# Patient Record
Sex: Male | Born: 1955 | Race: Black or African American | Hispanic: No | Marital: Single | Smoking: Never smoker
Health system: Southern US, Community
[De-identification: ages and names within clinical notes are randomized; demographics above are authoritative.]

## PROBLEM LIST (undated history)

## (undated) DIAGNOSIS — E119 Type 2 diabetes mellitus without complications: Secondary | ICD-10-CM

## (undated) DIAGNOSIS — E7800 Pure hypercholesterolemia, unspecified: Secondary | ICD-10-CM

## (undated) DIAGNOSIS — S68119A Complete traumatic metacarpophalangeal amputation of unspecified finger, initial encounter: Secondary | ICD-10-CM

## (undated) DIAGNOSIS — I10 Essential (primary) hypertension: Secondary | ICD-10-CM

## (undated) HISTORY — DX: Type 2 diabetes mellitus without complications: E11.9

## (undated) HISTORY — DX: Complete traumatic metacarpophalangeal amputation of unspecified finger, initial encounter: S68.119A

## (undated) HISTORY — PX: HERNIA REPAIR: SHX51

## (undated) HISTORY — DX: Essential (primary) hypertension: I10

---

## 2008-09-22 ENCOUNTER — Inpatient Hospital Stay (HOSPITAL_COMMUNITY): Admission: EM | Admit: 2008-09-22 | Discharge: 2008-09-23 | Payer: Self-pay | Admitting: Emergency Medicine

## 2008-09-25 ENCOUNTER — Encounter (INDEPENDENT_AMBULATORY_CARE_PROVIDER_SITE_OTHER): Payer: Self-pay | Admitting: Internal Medicine

## 2008-09-25 ENCOUNTER — Ambulatory Visit (HOSPITAL_COMMUNITY): Admission: RE | Admit: 2008-09-25 | Discharge: 2008-09-25 | Payer: Self-pay | Admitting: Internal Medicine

## 2008-09-25 ENCOUNTER — Ambulatory Visit: Payer: Self-pay | Admitting: Cardiology

## 2011-03-18 NOTE — H&P (Signed)
NAME:  Jack Cherry, Jack Cherry               ACCOUNT NO.:  [id number]   MEDICAL RECORD NO.:  [id number]          PATIENT TYPE:  INP   LOCATION:  A309                          FACILITY:  [facility]   PHYSICIAN:  Lonia Blood, M.D.      DATE OF BIRTH:  1956-02-23   DATE OF ADMISSION:  09/22/2008  DATE OF DISCHARGE:  11/21/2009LH                              HISTORY & PHYSICAL   PRIMARY CARE PHYSICIAN:  The patient is unassigned.   PRESENTING COMPLAINT:  Abnormal EKG.   HISTORY OF PRESENT ILLNESS:  The patient is a 55 year old African  American man who apparently was arrested by the police selling drugs,  but the workup was to screen the patient.  EMS was called, and the  patient was found to have blood pressure of over 180/120.  In addition,  he was found to have abnormal EKG.  The patient, however, has no  specific complaint.  No chest pain, no shortness of breath.  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  He was brought  to the emergency room with a high blood pressure.  The combination of  his high blood pressure and abnormal EKG with no previous EKG was  considered worrisome and after that the patient was admitted for further  workup.  The patient sells drugs, but denied using any IV drug use,  denied any drug at all.  Specifically, he denies using any cocaine and  denied any prior chest pains or any other medical issues.   PAST MEDICAL HISTORY:  None.   ALLERGY:  No known drug allergies.   MEDICATIONS:  None.   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   FAMILY HISTORY:  Unknown.   REVIEW OF SYSTEMS:  A 12-point review of systems is completely negative  except per HPI.   PHYSICAL EXAMINATION:  VITAL SIGNS:  Temperature is 98, blood pressure  179/119, and his pulse was 111.  His sats were 92% on room air.  GENERAL:  The patient is awake, alert,  oriented, in no acute distress.  HEENT:  PERRL.  EOMI.  NECK:  Supple.  No JVD.  No lymphadenopathy.  RESPIRATORY:  He has good air entry bilaterally.  No wheezes or rales.  CARDIOVASCULAR SYSTEM:  The patient has S1 and S2.  No murmur.  ABDOMEN:  Soft, nontender with positive bowel sounds.  EXTREMITIES:  No edema, cyanosis, or clubbing.   LABORATORY DATA:  White count 6.7, hemoglobin 13.5, platelet count 227  with normal differentials.  Initial myoglobin 477, but otherwise cardiac  enzymes negative.  Urinalysis negative.  Urine drug screen is completely  negative.  D-dimer 0.41.  PT 13, INR 1.0.  BNP less than 30.  Sodium is  139, potassium 3.7, chloride 110, CO2 24, glucose 114, BUN 8, creatinine  0.97, and calcium 9.2.  Salicylate level is negative.  Alcohol level  less than 5.  His chest x-ray showed no active  disease.  Head CT without  contrast also showed normal exam.   ASSESSMENT:  Therefore, he is a 55 year old gentleman with normal EKG  that shows sinus tachycardia with a rate of 108 and nonspecific ST-wave  changes mainly involving the inferolateral region with T-wave inversion,  otherwise normal intervals.  The patient may have had prior  myocardial  infarction, although he denied it and this could be a manifestation of  hypertensive heart disease.  Other possibilities could be a normal  variant patient.  The patient has no symptoms at all.  However, we will  admit him for observation and check 2D echo to make sure we are not  missing something or some cardiomyopathy.  If all results are normal, we  will discharge the patient home after a 2D echo.  1. Hypertension.  Again, this must be longstanding.  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      Lonia Blood, M.D.  Electronically Signed     LG/MEDQ  D:  09/23/2008  T:  09/23/2008  Job:  [id number]

## 2011-08-06 LAB — CBC
HCT: 35.4 — ABNORMAL LOW
HCT: 39.1
MCHC: 34.7
MCV: 91.9
MCV: 92.8
Platelets: 227
RBC: 3.82 — ABNORMAL LOW
WBC: 6.2

## 2011-08-06 LAB — URINALYSIS, ROUTINE W REFLEX MICROSCOPIC
Bilirubin Urine: NEGATIVE
Ketones, ur: NEGATIVE
Leukocytes, UA: NEGATIVE
Protein, ur: 100 — AB
Specific Gravity, Urine: 1.03
Urobilinogen, UA: 0.2

## 2011-08-06 LAB — DIFFERENTIAL
Basophils Absolute: 0
Eosinophils Absolute: 0
Eosinophils Relative: 0
Lymphocytes Relative: 13
Lymphs Abs: 0.9
Lymphs Abs: 1.3
Monocytes Relative: 8
Neutro Abs: 5.5
Neutrophils Relative %: 70

## 2011-08-06 LAB — APTT: aPTT: 23 — ABNORMAL LOW

## 2011-08-06 LAB — COMPREHENSIVE METABOLIC PANEL
AST: 26
BUN: 8
CO2: 24
Calcium: 9.2
Creatinine, Ser: 0.97
GFR calc Af Amer: >60
GFR calc non Af Amer: >60

## 2011-08-06 LAB — BASIC METABOLIC PANEL
Chloride: 108
Creatinine, Ser: 0.95
GFR calc Af Amer: >60
Potassium: 3.8
Sodium: 141

## 2011-08-06 LAB — RAPID URINE DRUG SCREEN, HOSP PERFORMED
Barbiturates: NOT DETECTED
Cocaine: NOT DETECTED
Opiates: NOT DETECTED

## 2011-08-06 LAB — B-NATRIURETIC PEPTIDE (CONVERTED LAB): Pro B Natriuretic peptide (BNP): <30

## 2011-08-06 LAB — CARDIAC PANEL(CRET KIN+CKTOT+MB+TROPI): Total CK: 1009 — ABNORMAL HIGH

## 2011-08-06 LAB — URINE MICROSCOPIC-ADD ON

## 2011-08-06 LAB — POCT CARDIAC MARKERS: Troponin i, poc: <0.05

## 2011-08-06 LAB — SALICYLATE LEVEL: Salicylate Lvl: <4

## 2011-08-06 LAB — PROTIME-INR
INR: 1
Prothrombin Time: 13

## 2011-08-06 LAB — D-DIMER, QUANTITATIVE: D-Dimer, Quant: 0.41

## 2015-08-19 ENCOUNTER — Other Ambulatory Visit: Payer: Self-pay | Admitting: Physician Assistant

## 2015-09-06 ENCOUNTER — Other Ambulatory Visit: Payer: Self-pay | Admitting: Physician Assistant

## 2015-09-24 ENCOUNTER — Ambulatory Visit: Payer: Self-pay | Admitting: Physician Assistant

## 2015-09-24 ENCOUNTER — Encounter: Payer: Self-pay | Admitting: Physician Assistant

## 2015-09-24 VITALS — BP 166/95 | HR 59 | Temp 97.5°F | Ht 71.0 in | Wt 247.5 lb

## 2015-09-24 DIAGNOSIS — I10 Essential (primary) hypertension: Secondary | ICD-10-CM | POA: Insufficient documentation

## 2015-09-24 MED ORDER — LISINOPRIL 10 MG PO TABS: 10.0000 mg | ORAL_TABLET | Freq: Every day | ORAL | Status: DC

## 2015-09-24 NOTE — Progress Notes (Signed)
   BP 166/95 mmHg  Pulse 59  Temp(Src) 97.5 F (36.4 C)  Ht 5\' 11"  (1.803 m)  Wt 247 lb 8 oz (112.265 kg)  BMI 34.53 kg/m2  SpO2 97%   Subjective:    Patient ID: Jack Cherry, male    DOB: 1956-01-12, 59 y.o.   MRN: [id number]  HPI: Jack Cherry is a 59 y.o. male presenting on 09/24/2015 for Hypertension   HPI  Chief Complaint  Patient presents with  . Hypertension    pt states he took his BP med this morning.     Relevant past medical, surgical, family and social history reviewed and updated as indicated. Interim medical history since our last visit reviewed. Allergies and medications reviewed and updated.  Current outpatient prescriptions:  .  amLODipine (NORVASC) 10 MG tablet, Take 10 mg by mouth daily., Disp: , Rfl:  .  atenolol (TENORMIN) 100 MG tablet, TAKE ONE TABLET BY MOUTH EVERY DAY FOR BLOOD PRESSURE, Disp: 30 tablet, Rfl: 1 .  loratadine (CLARITIN) 10 MG tablet, Take 1 tablet (10 mg total) by mouth daily as needed for allergies. (Patient taking differently: Take 10 mg by mouth daily. ), Disp: 30 tablet, Rfl: 1   Review of Systems  Constitutional: Negative for fever, chills, diaphoresis, appetite change, fatigue and unexpected weight change.  HENT: Positive for sneezing. Negative for congestion, dental problem, drooling, ear pain, facial swelling, hearing loss, mouth sores, sore throat, trouble swallowing and voice change.   Eyes: Negative for pain, discharge, redness, itching and visual disturbance.  Respiratory: Negative for choking and shortness of breath.   Cardiovascular: Negative for chest pain, palpitations and leg swelling.  Gastrointestinal: Negative for vomiting, abdominal pain, diarrhea, constipation and blood in stool.  Endocrine: Negative for cold intolerance, heat intolerance and polydipsia.  Genitourinary: Negative for dysuria, hematuria and decreased urine volume.  Musculoskeletal: Negative for back pain, arthralgias and gait problem.  Skin:  Negative for rash.  Allergic/Immunologic: Negative for environmental allergies.  Neurological: Negative for seizures, syncope, light-headedness and headaches.  Hematological: Negative for adenopathy.  Psychiatric/Behavioral: Negative for suicidal ideas, dysphoric mood and agitation. The patient is not nervous/anxious.     Per HPI unless specifically indicated above     Objective:    BP 166/95 mmHg  Pulse 59  Temp(Src) 97.5 F (36.4 C)  Ht 5\' 11"  (1.803 m)  Wt 247 lb 8 oz (112.265 kg)  BMI 34.53 kg/m2  SpO2 97%  Wt Readings from Last 3 Encounters:  09/24/15 247 lb 8 oz (112.265 kg)    Physical Exam  Constitutional: He is oriented to person, place, and time. He appears well-developed and well-nourished.  HENT:  Head: Normocephalic and atraumatic.  Neck: Neck supple.  Cardiovascular: Normal rate and regular rhythm.   Pulmonary/Chest: Effort normal and breath sounds normal. He has no wheezes.  Abdominal: Soft. Bowel sounds are normal. There is no tenderness.  Obese  Lymphadenopathy:    He has no cervical adenopathy.  Neurological: He is alert and oriented to person, place, and time.  Skin: Skin is warm and dry.  Psychiatric: He has a normal mood and affect. His behavior is normal.  Vitals reviewed.       Assessment & Plan:   Encounter Diagnosis  Name Primary?  . Essential hypertension, benign Yes    Add lisinopril and recheck bp 6 wk

## 2015-10-02 ENCOUNTER — Other Ambulatory Visit: Payer: Self-pay | Admitting: Physician Assistant

## 2015-10-30 ENCOUNTER — Other Ambulatory Visit: Payer: Self-pay | Admitting: Physician Assistant

## 2015-11-14 ENCOUNTER — Other Ambulatory Visit: Payer: Self-pay | Admitting: Physician Assistant

## 2015-11-19 ENCOUNTER — Ambulatory Visit: Payer: Self-pay | Admitting: Physician Assistant

## 2015-11-19 ENCOUNTER — Encounter: Payer: Self-pay | Admitting: Physician Assistant

## 2015-11-19 VITALS — BP 176/110 | HR 64 | Temp 98.1°F | Ht 71.0 in | Wt 253.0 lb

## 2015-11-19 DIAGNOSIS — R059 Cough, unspecified: Secondary | ICD-10-CM

## 2015-11-19 DIAGNOSIS — R7303 Prediabetes: Secondary | ICD-10-CM

## 2015-11-19 DIAGNOSIS — Z13220 Encounter for screening for lipoid disorders: Secondary | ICD-10-CM

## 2015-11-19 DIAGNOSIS — R05 Cough: Secondary | ICD-10-CM

## 2015-11-19 DIAGNOSIS — Z125 Encounter for screening for malignant neoplasm of prostate: Secondary | ICD-10-CM

## 2015-11-19 DIAGNOSIS — I10 Essential (primary) hypertension: Secondary | ICD-10-CM

## 2015-11-19 MED ORDER — ATENOLOL 100 MG PO TABS: 100.0000 mg | ORAL_TABLET | Freq: Every day | ORAL | Status: DC

## 2015-11-19 MED ORDER — LISINOPRIL 20 MG PO TABS: 20.0000 mg | ORAL_TABLET | Freq: Every day | ORAL | Status: DC

## 2015-11-19 NOTE — Progress Notes (Signed)
BP 176/110 mmHg  Pulse 64  Temp(Src) 98.1 F (36.7 C)  Ht 5\' 11"  (1.803 m)  Wt 253 lb (114.76 kg)  BMI 35.30 kg/m2  SpO2 96%   Subjective:    Patient ID: Jack Cherry, male    DOB: [birth year], 60 y.o.   MRN: [id number]  HPI: Jack Cherry is a 60 y.o. male presenting on 11/19/2015 for Hypertension   HPI   Pt says he is feeling good.  Not much cough.  He thinks it is better than it was at last OV.  Relevant past medical, surgical, family and social history reviewed and updated as indicated. Interim medical history since our last visit reviewed. Allergies and medications reviewed and updated.   Current outpatient prescriptions:  .  amLODipine (NORVASC) 10 MG tablet, TAKE ONE TABLET BY MOUTH EVERY DAY FOR BLOOD PRESSURE, Disp: 30 tablet, Rfl: 2 .  atenolol (TENORMIN) 100 MG tablet, TAKE ONE TABLET BY MOUTH EVERY DAY FOR BLOOD PRESSURE, Disp: 30 tablet, Rfl: 2 .  lisinopril (PRINIVIL,ZESTRIL) 10 MG tablet, Take 1 tablet (10 mg total) by mouth daily., Disp: 30 tablet, Rfl: 3 .  loratadine (CLARITIN) 10 MG tablet, Take 1 tablet (10 mg total) by mouth daily as needed., Disp: 30 tablet, Rfl: 3   Review of Systems  Constitutional: Negative for fever, chills, diaphoresis, appetite change, fatigue and unexpected weight change.  HENT: Negative for congestion, dental problem, drooling, ear pain, facial swelling, hearing loss, mouth sores, sneezing, sore throat, trouble swallowing and voice change.   Eyes: Negative for pain, discharge, redness, itching and visual disturbance.  Respiratory: Positive for cough. Negative for choking, shortness of breath and wheezing.   Cardiovascular: Negative for chest pain, palpitations and leg swelling.  Gastrointestinal: Negative for vomiting, abdominal pain, diarrhea, constipation and blood in stool.  Endocrine: Negative for cold intolerance, heat intolerance and polydipsia.  Genitourinary: Negative for dysuria, hematuria and decreased urine volume.   Musculoskeletal: Negative for back pain, arthralgias and gait problem.  Skin: Negative for rash.  Allergic/Immunologic: Positive for environmental allergies.  Neurological: Negative for seizures, syncope, light-headedness and headaches.  Hematological: Negative for adenopathy.  Psychiatric/Behavioral: Negative for suicidal ideas, dysphoric mood and agitation. The patient is not nervous/anxious.     Per HPI unless specifically indicated above     Objective:    BP 176/110 mmHg  Pulse 64  Temp(Src) 98.1 F (36.7 C)  Ht 5\' 11"  (1.803 m)  Wt 253 lb (114.76 kg)  BMI 35.30 kg/m2  SpO2 96%  Wt Readings from Last 3 Encounters:  11/19/15 253 lb (114.76 kg)  09/24/15 247 lb 8 oz (112.265 kg)    Physical Exam  Constitutional: He is oriented to person, place, and time. He appears well-developed and well-nourished.  HENT:  Head: Normocephalic and atraumatic.  Neck: Neck supple.  Cardiovascular: Normal rate and regular rhythm.   Pulmonary/Chest: Effort normal and breath sounds normal. He has no wheezes.  Abdominal: Soft. Bowel sounds are normal. There is no tenderness.  Musculoskeletal: He exhibits no edema.  Lymphadenopathy:    He has no cervical adenopathy.  Neurological: He is alert and oriented to person, place, and time.  Skin: Skin is warm and dry.  Psychiatric: He has a normal mood and affect. His behavior is normal.  Vitals reviewed.       Assessment & Plan:   Encounter Diagnoses  Name Primary?  . Essential hypertension, benign Yes  . Screening for prostate cancer   . Screening cholesterol level   .  Prediabetes   . Cough     -Increase lisinopril to 20mg - will send rx to [facility] -Sign up pt for [facility] -[facility] discount app to get cxr -F/u 1 mo to recheck bp and review labs

## 2015-11-19 NOTE — Patient Instructions (Addendum)
Fill out and turn in [place] form to [employer] blood drawn in [place] at [employer] out and turn in [employer] application (to check chest xray)  Medicines coming from [facility]: Amlodipine Lisinopril Loratadine  Medicines you need to pick up from [facility] [place] Atenolol

## 2015-12-17 ENCOUNTER — Other Ambulatory Visit: Payer: Self-pay | Admitting: Physician Assistant

## 2015-12-17 MED ORDER — AMLODIPINE BESYLATE 10 MG PO TABS: 10.0000 mg | ORAL_TABLET | Freq: Every day | ORAL | Status: DC

## 2015-12-24 ENCOUNTER — Ambulatory Visit: Payer: Self-pay | Admitting: Physician Assistant

## 2015-12-24 ENCOUNTER — Encounter: Payer: Self-pay | Admitting: Physician Assistant

## 2015-12-24 VITALS — BP 154/87 | HR 59 | Temp 97.4°F | Ht 71.0 in | Wt 258.0 lb

## 2015-12-24 DIAGNOSIS — R05 Cough: Secondary | ICD-10-CM

## 2015-12-24 DIAGNOSIS — R059 Cough, unspecified: Secondary | ICD-10-CM | POA: Insufficient documentation

## 2015-12-24 DIAGNOSIS — I10 Essential (primary) hypertension: Secondary | ICD-10-CM

## 2015-12-24 DIAGNOSIS — E669 Obesity, unspecified: Secondary | ICD-10-CM

## 2015-12-24 NOTE — Patient Instructions (Signed)
Get blood drawn (fasting)- in [place] across from [facility]- at [place]. Get chest xray- at [facility].

## 2015-12-24 NOTE — Progress Notes (Signed)
BP 154/87 mmHg  Pulse 59  Temp(Src) 97.4 F (36.3 C)  Ht 5\' 11"  (1.803 m)  Wt 258 lb (117.028 kg)  BMI 36.00 kg/m2  SpO2 96%   Subjective:    Patient ID: Jack Cherry, male    DOB: [birth year], 60 y.o.   MRN: [id number]  HPI: Jack Cherry is a 60 y.o. male presenting on 12/24/2015 for Hypertension   HPI   Pt states his cough is getting better  Pt did not get blood drawn.   He says he turned in his [facility] discount application   Relevant past medical, surgical, family and social history reviewed and updated as indicated. Interim medical history since our last visit reviewed. Allergies and medications reviewed and updated.  Current outpatient prescriptions:  .  amLODipine (NORVASC) 10 MG tablet, Take 1 tablet (10 mg total) by mouth daily. for blood pressure, Disp: 90 tablet, Rfl: 1 .  atenolol (TENORMIN) 100 MG tablet, Take 1 tablet (100 mg total) by mouth daily., Disp: 30 tablet, Rfl: 3 .  lisinopril (PRINIVIL,ZESTRIL) 20 MG tablet, Take 1 tablet (20 mg total) by mouth daily., Disp: 90 tablet, Rfl: 3 .  loratadine (CLARITIN) 10 MG tablet, Take 1 tablet (10 mg total) by mouth daily as needed., Disp: 30 tablet, Rfl: 3   Review of Systems  Constitutional: Negative for fever, chills, diaphoresis, appetite change, fatigue and unexpected weight change.  HENT: Negative for congestion, dental problem, drooling, ear pain, facial swelling, hearing loss, mouth sores, sneezing, sore throat, trouble swallowing and voice change.   Eyes: Negative for pain, discharge, redness, itching and visual disturbance.  Respiratory: Positive for cough. Negative for choking, shortness of breath and wheezing.   Cardiovascular: Negative for chest pain, palpitations and leg swelling.  Gastrointestinal: Negative for vomiting, abdominal pain, diarrhea, constipation and blood in stool.  Endocrine: Negative for cold intolerance, heat intolerance and polydipsia.  Genitourinary: Negative for dysuria, hematuria  and decreased urine volume.  Musculoskeletal: Negative for back pain, arthralgias and gait problem.  Skin: Negative for rash.  Allergic/Immunologic: Negative for environmental allergies.  Neurological: Negative for seizures, syncope, light-headedness and headaches.  Hematological: Negative for adenopathy.  Psychiatric/Behavioral: Negative for suicidal ideas, dysphoric mood and agitation. The patient is not nervous/anxious.     Per HPI unless specifically indicated above     Objective:    BP 154/87 mmHg  Pulse 59  Temp(Src) 97.4 F (36.3 C)  Ht 5\' 11"  (1.803 m)  Wt 258 lb (117.028 kg)  BMI 36.00 kg/m2  SpO2 96%  Wt Readings from Last 3 Encounters:  12/24/15 258 lb (117.028 kg)  11/19/15 253 lb (114.76 kg)  09/24/15 247 lb 8 oz (112.265 kg)    Physical Exam  Constitutional: He is oriented to person, place, and time. He appears well-developed and well-nourished.  HENT:  Head: Normocephalic and atraumatic.  Neck: Neck supple.  Cardiovascular: Normal rate and regular rhythm.   Pulmonary/Chest: Effort normal and breath sounds normal. He has no wheezes.  Abdominal: Soft. Bowel sounds are normal. There is no hepatosplenomegaly. There is no tenderness.  Musculoskeletal: He exhibits no edema.  Lymphadenopathy:    He has no cervical adenopathy.  Neurological: He is alert and oriented to person, place, and time.  Skin: Skin is warm and dry.  Psychiatric: He has a normal mood and affect. His behavior is normal.  Vitals reviewed.       Assessment & Plan:   Encounter Diagnoses  Name Primary?  . Essential hypertension, benign Yes  .  Cough   . Obesity, unspecified     -get blood drawn -get cxr -f/u 1 mo

## 2015-12-26 ENCOUNTER — Other Ambulatory Visit: Payer: Self-pay | Admitting: Physician Assistant

## 2015-12-26 ENCOUNTER — Ambulatory Visit (HOSPITAL_COMMUNITY)
Admission: RE | Admit: 2015-12-26 | Discharge: 2015-12-26 | Disposition: A | Discharge status: Home / Self Care | Payer: Self-pay | Source: Ambulatory Visit | Attending: Physician Assistant | Admitting: Physician Assistant

## 2015-12-26 DIAGNOSIS — R05 Cough: Secondary | ICD-10-CM | POA: Insufficient documentation

## 2015-12-26 DIAGNOSIS — Z87891 Personal history of nicotine dependence: Secondary | ICD-10-CM | POA: Insufficient documentation

## 2015-12-26 LAB — LIPID PANEL
CHOL/HDL RATIO: 5.8 ratio — AB (ref <=5.0)
Cholesterol: 190 mg/dL (ref 125–200)
HDL: 33 mg/dL — AB (ref >=40)
LDL CALC: 108 mg/dL (ref <130)
TRIGLYCERIDES: 244 mg/dL — AB (ref <150)
VLDL: 49 mg/dL — AB (ref <30)

## 2015-12-26 LAB — COMPLETE METABOLIC PANEL WITH GFR
ALT: 27 U/L (ref 9–46)
AST: 26 U/L (ref 10–35)
Albumin: 4.2 g/dL (ref 3.6–5.1)
Alkaline Phosphatase: 53 U/L (ref 40–115)
BILIRUBIN TOTAL: 0.4 mg/dL (ref 0.2–1.2)
BUN: 14 mg/dL (ref 7–25)
CALCIUM: 9.3 mg/dL (ref 8.6–10.3)
CHLORIDE: 106 mmol/L (ref 98–110)
CO2: 23 mmol/L (ref 20–31)
CREATININE: 0.98 mg/dL (ref 0.70–1.25)
GFR, Est Non African American: 83 mL/min (ref >=60)
Glucose, Bld: 102 mg/dL — ABNORMAL HIGH (ref 65–99)
Potassium: 4.2 mmol/L (ref 3.5–5.3)
Sodium: 140 mmol/L (ref 135–146)
TOTAL PROTEIN: 7.3 g/dL (ref 6.1–8.1)

## 2015-12-26 LAB — CBC WITH DIFFERENTIAL/PLATELET
BASOS ABS: 0 10*3/uL (ref 0.0–0.1)
Basophils Relative: 1 % (ref 0–1)
EOS ABS: 0.1 10*3/uL (ref 0.0–0.7)
EOS PCT: 3 % (ref 0–5)
HEMATOCRIT: 38.3 % — AB (ref 39.0–52.0)
Hemoglobin: 12.8 g/dL — ABNORMAL LOW (ref 13.0–17.0)
LYMPHS ABS: 1.6 10*3/uL (ref 0.7–4.0)
LYMPHS PCT: 46 % (ref 12–46)
MCH: 30.9 pg (ref 26.0–34.0)
MCHC: 33.4 g/dL (ref 30.0–36.0)
MCV: 92.5 fL (ref 78.0–100.0)
MONO ABS: 0.3 10*3/uL (ref 0.1–1.0)
MPV: 9.9 fL (ref 8.6–12.4)
Monocytes Relative: 8 % (ref 3–12)
Neutro Abs: 1.5 10*3/uL — ABNORMAL LOW (ref 1.7–7.7)
Neutrophils Relative %: 42 % — ABNORMAL LOW (ref 43–77)
PLATELETS: 228 10*3/uL (ref 150–400)
RBC: 4.14 MIL/uL — ABNORMAL LOW (ref 4.22–5.81)
RDW: 15.5 % (ref 11.5–15.5)
WBC: 3.5 10*3/uL — AB (ref 4.0–10.5)

## 2015-12-26 LAB — HEMOGLOBIN A1C
Hgb A1c MFr Bld: 6.5 % — ABNORMAL HIGH (ref <5.7)
Mean Plasma Glucose: 140 mg/dL — ABNORMAL HIGH (ref <117)

## 2015-12-27 LAB — PSA: PSA: 2.41 ng/mL (ref <=4.00)

## 2016-01-21 ENCOUNTER — Encounter: Payer: Self-pay | Admitting: Physician Assistant

## 2016-01-21 ENCOUNTER — Ambulatory Visit: Payer: Self-pay | Admitting: Physician Assistant

## 2016-01-21 ENCOUNTER — Encounter: Payer: Self-pay | Admitting: Student

## 2016-01-21 VITALS — BP 130/88 | HR 63 | Temp 99.0°F | Ht 71.0 in | Wt 255.0 lb

## 2016-01-21 DIAGNOSIS — I10 Essential (primary) hypertension: Secondary | ICD-10-CM

## 2016-01-21 DIAGNOSIS — Z6835 Body mass index (BMI) 35.0-35.9, adult: Secondary | ICD-10-CM | POA: Insufficient documentation

## 2016-01-21 DIAGNOSIS — E66812 Obesity, class 2: Secondary | ICD-10-CM | POA: Insufficient documentation

## 2016-01-21 DIAGNOSIS — R059 Cough, unspecified: Secondary | ICD-10-CM

## 2016-01-21 DIAGNOSIS — R05 Cough: Secondary | ICD-10-CM

## 2016-01-21 DIAGNOSIS — E785 Hyperlipidemia, unspecified: Secondary | ICD-10-CM

## 2016-01-21 DIAGNOSIS — E782 Mixed hyperlipidemia: Secondary | ICD-10-CM | POA: Insufficient documentation

## 2016-01-21 DIAGNOSIS — E119 Type 2 diabetes mellitus without complications: Secondary | ICD-10-CM

## 2016-01-21 DIAGNOSIS — E1165 Type 2 diabetes mellitus with hyperglycemia: Secondary | ICD-10-CM | POA: Insufficient documentation

## 2016-01-21 DIAGNOSIS — E669 Obesity, unspecified: Secondary | ICD-10-CM

## 2016-01-21 MED ORDER — ATORVASTATIN CALCIUM 10 MG PO TABS: 10.0000 mg | ORAL_TABLET | Freq: Every day | ORAL | Status: DC

## 2016-01-21 MED ORDER — LOSARTAN POTASSIUM 100 MG PO TABS: 100.0000 mg | ORAL_TABLET | Freq: Every day | ORAL | Status: DC

## 2016-01-21 MED ORDER — METFORMIN HCL ER 500 MG PO TB24: 500.0000 mg | ORAL_TABLET | Freq: Every day | ORAL | Status: DC

## 2016-01-21 NOTE — Telephone Encounter (Signed)
Err. Encounter

## 2016-01-21 NOTE — Patient Instructions (Signed)
Type 2 Diabetes Mellitus, Adult  Type 2 diabetes mellitus is a long-term (chronic) disease. In type 2 diabetes:  · The pancreas does not make enough of a hormone called insulin.  · The cells in the body do not respond as well to the insulin that is made.  · Both of the above can happen.  Normally, insulin moves sugars from food into tissue cells. This gives you energy. If you have type 2 diabetes, sugars cannot be moved into tissue cells. This causes high blood sugar (hyperglycemia).   Your doctors will set personal treatment goals for you based on your age, your medicines, how long you have had diabetes, and any other medical conditions you have. Generally, the goal of treatment is to maintain the following blood glucose levels:  · Before meals (preprandial): 80-130 mg/dL.  · After meals (postprandial): below 180 mg/dL.  · A1c: less than 6.5-7%.  HOME CARE  · Have your hemoglobin A1c level checked twice a year. The level shows if your diabetes is under control or out of control.  · Test your blood sugar level every day as told by your doctor.  · Check your ketone levels by testing your pee (urine) when you are sick and as told.  · Take your diabetes or insulin medicine as told by your doctor.    Never run out of insulin.    Adjust how much insulin you give yourself based on how many carbs (carbohydrates) you eat. Carbs are in many foods, such as fruits, vegetables, whole grains, and dairy products.  · Have a healthy snack between every healthy meal. Have 3 meals and 3 snacks a day.  · Lose weight if you are overweight.  · Carry a medical alert card or wear your medical alert jewelry.  · Carry a 15-gram carb snack with you at all times. Examples include:    Glucose pills, 3 or 4.    Glucose gel, 15-gram tube.    Raisins, 2 tablespoons (24 grams).    Jelly beans, 6.    Animal crackers, 8.    Regular (not diet) pop, 4 ounces (120 milliliters).    Gummy treats, 9.  · Notice low blood sugar (hypoglycemia) symptoms, such  as:    Shaking (tremors).    Trouble thinking clearly.    Sweating.    Faster heart rate.    Headache.    Dry mouth.    Hunger.    Crabbiness (irritability).    Being worried or tense (anxious).    Restless sleep.    A change in speech or coordination.    Confusion.  · Treat low blood sugar right away. If you are alert and can swallow, follow the 15:15 rule:    Take 15-20 grams of a rapid-acting glucose or carb. This includes glucose gel, glucose pills, or 4 ounces (120 milliliters) of fruit juice, regular pop, or low-fat milk.    Check your blood sugar level 15 minutes after taking the glucose.    Take 15-20 grams more of glucose if the repeat blood sugar level is still 70 mg/dL (milligrams/deciliter) or below.    Eat a meal or snack within 1 hour of the blood sugar levels going back to normal.  · Notice early symptoms of high blood sugar, such as:    Being really thirsty or drinking a lot (polydipsia).    Peeing a lot (polyuria).  · Do at least 150 minutes of physical activity a week or as told.      Split the 150 minutes of activity up during the week. Do not do 150 minutes of activity in one day.    Perform exercises, such as weight lifting, at least 2 times a week or as told.    Spend no more than 90 minutes at one time inactive.  · Adjust your insulin or food intake as needed if you start a new exercise or sport.  · Follow your sick-day plan when you are not able to eat or drink as usual.  · Do not smoke, chew tobacco, or use electronic cigarettes.  · Women who are not pregnant should drink no more than 1 drink a day. Men should drink no more than 2 drinks a day.    Only drink alcohol with food.    Ask your doctor if alcohol is safe for you.    Tell your doctor if you drink alcohol several times during the week.  · See your doctor regularly.  · Schedule an eye exam soon after you are told you have diabetes. Schedule exams once every year.  · Check your skin and feet every day. Check for cuts, bruises, redness,  nail problems, bleeding, blisters, or sores. A doctor should do a foot exam once a year.  · Brush your teeth and gums twice a day. Floss once a day. Visit your dentist regularly.  · Share your diabetes plan with your workplace or school.  · Keep your shots that fight diseases (vaccines) up to date.    Get a flu (influenza) shot every year.    Get a pneumonia shot. If you are 65 years of age or older and you have never gotten a pneumonia shot, you might need to get two shots.    Ask your doctor which other shots you should get.  · Learn how to deal with stress.  · Get diabetes education and support as needed.  · Ask your doctor for special help if:    You need help to maintain or improve how you do things on your own.    You need help to maintain or improve the quality of your life.    You have foot or hand problems.    You have trouble cleaning yourself, dressing, eating, or doing physical activity.  GET HELP IF:  · You are unable to eat or drink for more than 6 hours.  · You feel sick to your stomach (nauseous) or throw up (vomit) for more than 6 hours.  · Your blood sugar level is over 240 mg/dL.  · There is a change in mental status.  · You get another serious illness.  · You have watery poop (diarrhea) for more than 6 hours.  · You have been sick or have had a fever for 2 or more days and are not getting better.  · You have pain when you are active.  GET HELP RIGHT AWAY IF:  · You have trouble breathing.  · Your ketone levels are higher than your doctor says they should be.  MAKE SURE YOU:  · Understand these instructions.  · Will watch your condition.  · Will get help right away if you are not doing well or get worse.     This information is not intended to replace advice given to you by your health care provider. Make sure you discuss any questions you have with your health care provider.     Document Released: 07/29/2008 Document Revised: 03/06/2015 Document Reviewed: 05/21/2012  Elsevier Interactive Patient

## 2016-01-21 NOTE — Progress Notes (Signed)
BP 130/88 mmHg  Pulse 63  Temp(Src) 99 F (37.2 C)  Ht _0  (1.803 m)  Wt 255 lb (115.667 kg)  BMI 35.58 kg/m2  SpO2 96%   Subjective:    Patient ID: Jack Cherry, male    DOB: 27-May-1956, 60 y.o.   MRN: [id number]  HPI: Jack Cherry is a 60 y.o. male presenting on 01/21/2016 for Hypertension and Cough   HPI   Pt states he still has some cough but not as much as he used to.  Relevant past medical, surgical, family and social history reviewed and updated as indicated. Interim medical history since our last visit reviewed. Allergies and medications reviewed and updated.  Current outpatient prescriptions:  .  amLODipine (NORVASC) 10 MG tablet, Take 1 tablet (10 mg total) by mouth daily. for blood pressure, Disp: 90 tablet, Rfl: 1 .  atenolol (TENORMIN) 100 MG tablet, Take 1 tablet (100 mg total) by mouth daily., Disp: 30 tablet, Rfl: 3 .  lisinopril (PRINIVIL,ZESTRIL) 20 MG tablet, Take 1 tablet (20 mg total) by mouth daily., Disp: 90 tablet, Rfl: 3 .  loratadine (CLARITIN) 10 MG tablet, Take 1 tablet (10 mg total) by mouth daily as needed., Disp: 30 tablet, Rfl: 3   Review of Systems  Constitutional: Negative for fever, chills, diaphoresis, appetite change, fatigue and unexpected weight change.  HENT: Negative for congestion, dental problem, drooling, ear pain, facial swelling, hearing loss, mouth sores, sneezing, sore throat, trouble swallowing and voice change.   Eyes: Negative for pain, discharge, redness, itching and visual disturbance.  Respiratory: Positive for cough. Negative for choking, shortness of breath and wheezing.   Cardiovascular: Negative for chest pain, palpitations and leg swelling.  Gastrointestinal: Negative for vomiting, abdominal pain, diarrhea, constipation and blood in stool.  Endocrine: Negative for cold intolerance, heat intolerance and polydipsia.  Genitourinary: Negative for dysuria, hematuria and decreased urine volume.  Musculoskeletal:  Negative for back pain, arthralgias and gait problem.  Skin: Negative for rash.  Allergic/Immunologic: Negative for environmental allergies.  Neurological: Negative for seizures, syncope, light-headedness and headaches.  Hematological: Negative for adenopathy.  Psychiatric/Behavioral: Negative for suicidal ideas, dysphoric mood and agitation. The patient is not nervous/anxious.     Per HPI unless specifically indicated above     Objective:    BP 130/88 mmHg  Pulse 63  Temp(Src) 99 F (37.2 C)  Ht _1  (1.803 m)  Wt 255 lb (115.667 kg)  BMI 35.58 kg/m2  SpO2 96%  Wt Readings from Last 3 Encounters:  01/21/16 255 lb (115.667 kg)  12/24/15 258 lb (117.028 kg)  11/19/15 253 lb (114.76 kg)    Physical Exam  Constitutional: He is oriented to person, place, and time. He appears well-developed and well-nourished.  HENT:  Head: Normocephalic and atraumatic.  Neck: Neck supple.  Cardiovascular: Normal rate and regular rhythm.   Pulmonary/Chest: Effort normal and breath sounds normal. He has no wheezes.  Abdominal: Soft. Bowel sounds are normal. There is no hepatosplenomegaly. There is no tenderness.  Musculoskeletal: He exhibits no edema.  Lymphadenopathy:    He has no cervical adenopathy.  Neurological: He is alert and oriented to person, place, and time.  Skin: Skin is warm and dry.  Psychiatric: He has a normal mood and affect. His behavior is normal.  Vitals reviewed.   Results for orders placed or performed in visit on 12/26/15  COMPLETE METABOLIC PANEL WITH GFR  Result Value Ref Range   Sodium 140 135 - 146 mmol/L   Potassium  4.2 3.5 - 5.3 mmol/L   Chloride 106 98 - 110 mmol/L   CO2 23 20 - 31 mmol/L   Glucose, Bld 102 (H) 65 - 99 mg/dL   BUN 14 7 - 25 mg/dL   Creat 0.98 0.70 - 1.25 mg/dL   Total Bilirubin 0.4 0.2 - 1.2 mg/dL   Alkaline Phosphatase 53 40 - 115 U/L   AST 26 10 - 35 U/L   ALT 27 9 - 46 U/L   Total Protein 7.3 6.1 - 8.1 g/dL   Albumin 4.2 3.6 -  5.1 g/dL   Calcium 9.3 8.6 - 10.3 mg/dL   GFR, Est African American >89 >=60 mL/min   GFR, Est Non African American 83 >=60 mL/min  CBC with Differential/Platelet  Result Value Ref Range   WBC 3.5 (L) 4.0 - 10.5 K/uL   RBC 4.14 (L) 4.22 - 5.81 MIL/uL   Hemoglobin 12.8 (L) 13.0 - 17.0 g/dL   HCT 38.3 (L) 39.0 - 52.0 %   MCV 92.5 78.0 - 100.0 fL   MCH 30.9 26.0 - 34.0 pg   MCHC 33.4 30.0 - 36.0 g/dL   RDW 15.5 11.5 - 15.5 %   Platelets 228 150 - 400 K/uL   MPV 9.9 8.6 - 12.4 fL   Neutrophils Relative % 42 (L) 43 - 77 %   Neutro Abs 1.5 (L) 1.7 - 7.7 K/uL   Lymphocytes Relative 46 12 - 46 %   Lymphs Abs 1.6 0.7 - 4.0 K/uL   Monocytes Relative 8 3 - 12 %   Monocytes Absolute 0.3 0.1 - 1.0 K/uL   Eosinophils Relative 3 0 - 5 %   Eosinophils Absolute 0.1 0.0 - 0.7 K/uL   Basophils Relative 1 0 - 1 %   Basophils Absolute 0.0 0.0 - 0.1 K/uL   Smear Review Criteria for review not met   Lipid panel  Result Value Ref Range   Cholesterol 190 125 - 200 mg/dL   Triglycerides 244 (H) <150 mg/dL   HDL 33 (L) >=40 mg/dL   Total CHOL/HDL Ratio 5.8 (H) <=5.0 Ratio   VLDL 49 (H) <30 mg/dL   LDL Cholesterol 108 <130 mg/dL  Hemoglobin A1c  Result Value Ref Range   Hgb A1c MFr Bld 6.5 (H) <5.7 %   Mean Plasma Glucose 140 (H) <117 mg/dL  PSA  Result Value Ref Range   PSA 2.41 <=4.00 ng/mL      Assessment & Plan:   Encounter Diagnoses  Name Primary?  . Type 2 diabetes mellitus without complication, unspecified long term insulin use status ([place]) Yes  . Essential hypertension, benign   . Hyperlipidemia   . Obesity, unspecified   . Cough     -reviewed labs with pt. Reviewed cxr with pt -Pt is a non-reader.  He needs low-fat diet.  Needs diabetic -teaching. He is given handout to go to Diabetic class at [facility].  Pt is counseled on diabetic eating -Stop lisinopril due to cough.  rx losartan -rx atorvastatin and metformin -F/u 1 month to check tolerance to new meds

## 2016-02-18 ENCOUNTER — Encounter: Payer: Self-pay | Admitting: Physician Assistant

## 2016-02-18 ENCOUNTER — Ambulatory Visit: Payer: Self-pay | Admitting: Physician Assistant

## 2016-02-18 VITALS — BP 118/84 | HR 94 | Temp 97.9°F | Ht 71.0 in | Wt 250.0 lb

## 2016-02-18 DIAGNOSIS — E785 Hyperlipidemia, unspecified: Secondary | ICD-10-CM

## 2016-02-18 DIAGNOSIS — E119 Type 2 diabetes mellitus without complications: Secondary | ICD-10-CM

## 2016-02-18 DIAGNOSIS — I10 Essential (primary) hypertension: Secondary | ICD-10-CM

## 2016-02-18 NOTE — Progress Notes (Signed)
   BP 118/84 mmHg  Pulse 94  Temp(Src) 97.9 F (36.6 C)  Ht 5\' 11"  (1.803 m)  Wt 250 lb (113.399 kg)  BMI 34.88 kg/m2  SpO2 97%   Subjective:    Patient ID: Jack Cherry, male    DOB: 13-Mar-1956, 60 y.o.   MRN: [id number]  HPI: Jack Cherry is a 60 y.o. male presenting on 02/18/2016 for Diabetes and Hypertension   HPI   Chief Complaint  Patient presents with  . Diabetes    pt has not received meds from [facility]  . Hypertension    Pt says he has not gotten his meds from [facility].  Pt attended DM education class and says he learned a lot.  Relevant past medical, surgical, family and social history reviewed and updated as indicated. Interim medical history since our last visit reviewed. Allergies and medications reviewed and updated.  CURRENT MEDS: Amlodipine 10mg  po qd Atenolol 100mg  qd Lisinopril 20mg  qd claritan 10mg  qd   Review of Systems  Per HPI unless specifically indicated above     Objective:    BP 118/84 mmHg  Pulse 94  Temp(Src) 97.9 F (36.6 C)  Ht 5\' 11"  (1.803 m)  Wt 250 lb (113.399 kg)  BMI 34.88 kg/m2  SpO2 97%  Wt Readings from Last 3 Encounters:  02/18/16 250 lb (113.399 kg)  01/21/16 255 lb (115.667 kg)  12/24/15 258 lb (117.028 kg)    Physical Exam  Constitutional: He is oriented to person, place, and time. He appears well-developed and well-nourished.  Pulmonary/Chest: Effort normal.  Neurological: He is alert and oriented to person, place, and time.  Skin: Skin is warm and dry.  Psychiatric: He has a normal mood and affect. His behavior is normal.  Vitals reviewed.       Assessment & Plan:    Encounter Diagnoses  Name Primary?  . Essential hypertension, benign Yes  . Type 2 diabetes mellitus without complication, unspecified long term insulin use status ([place])   . Hyperlipidemia    -We will contact [facility] to check on meds -F/u 1 month to check tolerance to new meds

## 2016-02-19 ENCOUNTER — Other Ambulatory Visit: Payer: Self-pay | Admitting: Physician Assistant

## 2016-02-19 MED ORDER — LOSARTAN POTASSIUM 100 MG PO TABS: 100.0000 mg | ORAL_TABLET | Freq: Every day | ORAL | Status: DC

## 2016-02-19 MED ORDER — METFORMIN HCL ER 500 MG PO TB24: 500.0000 mg | ORAL_TABLET | Freq: Every day | ORAL | Status: DC

## 2016-02-19 MED ORDER — AMLODIPINE BESYLATE 10 MG PO TABS: 10.0000 mg | ORAL_TABLET | Freq: Every day | ORAL | Status: DC

## 2016-02-19 MED ORDER — ATORVASTATIN CALCIUM 10 MG PO TABS: 10.0000 mg | ORAL_TABLET | Freq: Every day | ORAL | Status: DC

## 2016-03-17 ENCOUNTER — Ambulatory Visit: Payer: Self-pay | Admitting: Physician Assistant

## 2016-03-17 ENCOUNTER — Encounter: Payer: Self-pay | Admitting: Physician Assistant

## 2016-03-17 VITALS — BP 136/78 | HR 69 | Temp 97.7°F | Ht 71.0 in | Wt 248.0 lb

## 2016-03-17 DIAGNOSIS — Z1211 Encounter for screening for malignant neoplasm of colon: Secondary | ICD-10-CM

## 2016-03-17 DIAGNOSIS — E119 Type 2 diabetes mellitus without complications: Secondary | ICD-10-CM

## 2016-03-17 DIAGNOSIS — I10 Essential (primary) hypertension: Secondary | ICD-10-CM

## 2016-03-17 DIAGNOSIS — E785 Hyperlipidemia, unspecified: Secondary | ICD-10-CM

## 2016-03-17 NOTE — Progress Notes (Signed)
BP 156/98 mmHg  Pulse 69  Temp(Src) 97.7 F (36.5 C)  Ht 5\' 11"  (1.803 m)  Wt 248 lb (112.492 kg)  BMI 34.60 kg/m2  SpO2 95%   Subjective:    Patient ID: Jack Cherry, male    DOB: Feb 28, 1956, 60 y.o.   MRN: [id number]  HPI: Jack Cherry is a 60 y.o. male presenting on 03/17/2016 for Hypertension and Diabetes   HPI   Pt is doing okay with his new medicaitons.  bp high today.  Was 118/84 at [facility] on 02/18/16  Relevant past medical, surgical, family and social history reviewed and updated as indicated. Interim medical history since our last visit reviewed. Allergies and medications reviewed and updated.   Current outpatient prescriptions:  .  amLODipine (NORVASC) 10 MG tablet, Take 1 tablet (10 mg total) by mouth daily. for blood pressure, Disp: 90 tablet, Rfl: 1 .  atenolol (TENORMIN) 100 MG tablet, Take 1 tablet (100 mg total) by mouth daily., Disp: 30 tablet, Rfl: 3 .  atorvastatin (LIPITOR) 10 MG tablet, Take 1 tablet (10 mg total) by mouth daily., Disp: 90 tablet, Rfl: 1 .  loratadine (CLARITIN) 10 MG tablet, Take 1 tablet (10 mg total) by mouth daily as needed., Disp: 30 tablet, Rfl: 3 .  losartan (COZAAR) 100 MG tablet, Take 1 tablet (100 mg total) by mouth daily., Disp: 90 tablet, Rfl: 1 .  metFORMIN (GLUCOPHAGE XR) 500 MG 24 hr tablet, Take 1 tablet (500 mg total) by mouth daily with breakfast., Disp: 90 tablet, Rfl: 1   Review of Systems  Constitutional: Negative for fever, chills, diaphoresis, appetite change, fatigue and unexpected weight change.  HENT: Negative for congestion, dental problem, drooling, ear pain, facial swelling, hearing loss, mouth sores, sneezing, sore throat, trouble swallowing and voice change.   Eyes: Negative for pain, discharge, redness, itching and visual disturbance.  Respiratory: Negative for cough, choking, shortness of breath and wheezing.   Cardiovascular: Negative for chest pain, palpitations and leg swelling.  Gastrointestinal:  Negative for vomiting, abdominal pain, diarrhea, constipation and blood in stool.  Endocrine: Negative for cold intolerance, heat intolerance and polydipsia.  Genitourinary: Negative for dysuria, hematuria and decreased urine volume.  Musculoskeletal: Negative for back pain, arthralgias and gait problem.  Skin: Negative for rash.  Allergic/Immunologic: Negative for environmental allergies.  Neurological: Negative for seizures, syncope, light-headedness and headaches.  Hematological: Negative for adenopathy.  Psychiatric/Behavioral: Negative for suicidal ideas, dysphoric mood and agitation. The patient is not nervous/anxious.     Per HPI unless specifically indicated above     Objective:    BP 156/98 mmHg  Pulse 69  Temp(Src) 97.7 F (36.5 C)  Ht 5\' 11"  (1.803 m)  Wt 248 lb (112.492 kg)  BMI 34.60 kg/m2  SpO2 95%  Wt Readings from Last 3 Encounters:  03/17/16 248 lb (112.492 kg)  02/18/16 250 lb (113.399 kg)  01/21/16 255 lb (115.667 kg)    Physical Exam  Constitutional: He is oriented to person, place, and time. He appears well-developed and well-nourished.  HENT:  Head: Normocephalic and atraumatic.  Neck: Neck supple.  Cardiovascular: Normal rate and regular rhythm.   Pulmonary/Chest: Effort normal and breath sounds normal. He has no wheezes.  Abdominal: Soft. Bowel sounds are normal. There is no hepatosplenomegaly. There is no tenderness.  Musculoskeletal: He exhibits no edema.  Lymphadenopathy:    He has no cervical adenopathy.  Neurological: He is alert and oriented to person, place, and time.  Skin: Skin is warm and dry.  Psychiatric: He has a normal mood and affect. His behavior is normal.  Vitals reviewed.  Foot exam done     Assessment & Plan:   Encounter Diagnoses  Name Primary?  . Type 2 diabetes mellitus without complication, unspecified long term insulin use status ([place]) Yes  . Essential hypertension, benign   . Special screening for malignant  neoplasms, colon   . Hyperlipidemia     -Pt on list for dm eye exam -iFOBT given for colon cancer screening -continue current meds -F/u 2 mo with labs before appt

## 2016-03-24 LAB — IFOBT (OCCULT BLOOD): IFOBT: NEGATIVE

## 2016-04-26 ENCOUNTER — Other Ambulatory Visit: Payer: Self-pay | Admitting: Physician Assistant

## 2016-05-12 ENCOUNTER — Other Ambulatory Visit: Payer: Self-pay

## 2016-05-12 DIAGNOSIS — E785 Hyperlipidemia, unspecified: Secondary | ICD-10-CM

## 2016-05-12 DIAGNOSIS — I10 Essential (primary) hypertension: Secondary | ICD-10-CM

## 2016-05-12 DIAGNOSIS — E119 Type 2 diabetes mellitus without complications: Secondary | ICD-10-CM

## 2016-05-14 LAB — COMPLETE METABOLIC PANEL WITH GFR
ALT: 15 U/L (ref 9–46)
AST: 18 U/L (ref 10–35)
Albumin: 4.2 g/dL (ref 3.6–5.1)
Alkaline Phosphatase: 47 U/L (ref 40–115)
BILIRUBIN TOTAL: 0.4 mg/dL (ref 0.2–1.2)
BUN: 18 mg/dL (ref 7–25)
CO2: 21 mmol/L (ref 20–31)
CREATININE: 1.15 mg/dL (ref 0.70–1.25)
Calcium: 8.7 mg/dL (ref 8.6–10.3)
Chloride: 110 mmol/L (ref 98–110)
GFR, Est African American: 80 mL/min (ref >=60)
GFR, Est Non African American: 69 mL/min (ref >=60)
GLUCOSE: 99 mg/dL (ref 65–99)
Potassium: 4.1 mmol/L (ref 3.5–5.3)
SODIUM: 140 mmol/L (ref 135–146)
TOTAL PROTEIN: 6.7 g/dL (ref 6.1–8.1)

## 2016-05-14 LAB — LIPID PANEL
CHOL/HDL RATIO: 3 ratio (ref <=5.0)
CHOLESTEROL: 117 mg/dL — AB (ref 125–200)
HDL: 39 mg/dL — ABNORMAL LOW (ref >=40)
LDL Cholesterol: 62 mg/dL (ref <130)
Triglycerides: 82 mg/dL (ref <150)
VLDL: 16 mg/dL (ref <30)

## 2016-05-14 LAB — HEMOGLOBIN A1C
Hgb A1c MFr Bld: 6.3 % — ABNORMAL HIGH (ref <5.7)
MEAN PLASMA GLUCOSE: 134 mg/dL

## 2016-05-19 ENCOUNTER — Encounter: Payer: Self-pay | Admitting: Physician Assistant

## 2016-05-19 ENCOUNTER — Ambulatory Visit: Payer: Self-pay | Admitting: Physician Assistant

## 2016-05-19 VITALS — BP 136/90 | HR 93 | Temp 97.3°F | Ht 71.0 in | Wt 250.0 lb

## 2016-05-19 DIAGNOSIS — I10 Essential (primary) hypertension: Secondary | ICD-10-CM

## 2016-05-19 DIAGNOSIS — E119 Type 2 diabetes mellitus without complications: Secondary | ICD-10-CM

## 2016-05-19 DIAGNOSIS — E785 Hyperlipidemia, unspecified: Secondary | ICD-10-CM

## 2016-05-19 DIAGNOSIS — E669 Obesity, unspecified: Secondary | ICD-10-CM

## 2016-05-19 DIAGNOSIS — R011 Cardiac murmur, unspecified: Secondary | ICD-10-CM

## 2016-05-19 DIAGNOSIS — I491 Atrial premature depolarization: Secondary | ICD-10-CM

## 2016-05-19 NOTE — Progress Notes (Signed)
BP 136/90 mmHg  Pulse 93  Temp(Src) 97.3 F (36.3 C)  Ht 5\' 11"  (1.803 m)  Wt 250 lb (113.399 kg)  BMI 34.88 kg/m2  SpO2 97%   Subjective:    Patient ID: Jack Cherry, male    DOB: 1956-07-25, 60 y.o.   MRN: [id number]  HPI: Jack Cherry is a 60 y.o. male presenting on 05/19/2016 for Diabetes and Hypertension   HPI Pt states he is doing well today.    Relevant past medical, surgical, family and social history reviewed and updated as indicated. Interim medical history since our last visit reviewed. Allergies and medications reviewed and updated.  Current outpatient prescriptions:  .  amLODipine (NORVASC) 10 MG tablet, Take 1 tablet (10 mg total) by mouth daily. for blood pressure, Disp: 90 tablet, Rfl: 1 .  atenolol (TENORMIN) 100 MG tablet, TAKE ONE TABLET BY MOUTH ONCE DAILY, Disp: 30 tablet, Rfl: 4 .  atorvastatin (LIPITOR) 10 MG tablet, Take 1 tablet (10 mg total) by mouth daily., Disp: 90 tablet, Rfl: 1 .  loratadine (CLARITIN) 10 MG tablet, Take 1 tablet (10 mg total) by mouth daily as needed., Disp: 30 tablet, Rfl: 3 .  losartan (COZAAR) 100 MG tablet, Take 1 tablet (100 mg total) by mouth daily., Disp: 90 tablet, Rfl: 1 .  metFORMIN (GLUCOPHAGE XR) 500 MG 24 hr tablet, Take 1 tablet (500 mg total) by mouth daily with breakfast., Disp: 90 tablet, Rfl: 1  Review of Systems  Respiratory: Negative for shortness of breath.   Cardiovascular: Negative for chest pain, palpitations and leg swelling.  Gastrointestinal: Negative for abdominal pain.  Neurological: Negative for syncope.    Per HPI unless specifically indicated above     Objective:    BP 136/90 mmHg  Pulse 93  Temp(Src) 97.3 F (36.3 C)  Ht 5\' 11"  (1.803 m)  Wt 250 lb (113.399 kg)  BMI 34.88 kg/m2  SpO2 97%  Wt Readings from Last 3 Encounters:  05/19/16 250 lb (113.399 kg)  03/17/16 248 lb (112.492 kg)  02/18/16 250 lb (113.399 kg)    Physical Exam  Constitutional: He is oriented to person,  place, and time. He appears well-developed and well-nourished.  HENT:  Head: Normocephalic and atraumatic.  Neck: Neck supple.  Cardiovascular: Normal rate and regular rhythm.   Murmur heard. Frequent premature beats  Pulmonary/Chest: Effort normal and breath sounds normal. He has no wheezes.  Abdominal: Soft. Bowel sounds are normal. There is no hepatosplenomegaly. There is no tenderness.  Musculoskeletal: He exhibits no edema.  Lymphadenopathy:    He has no cervical adenopathy.  Neurological: He is alert and oriented to person, place, and time.  Skin: Skin is warm and dry.  Psychiatric: He has a normal mood and affect. His behavior is normal.  Vitals reviewed.   EKG- NSR with PACs. No previous for comp  Results for orders placed or performed in visit on 05/12/16  COMPLETE METABOLIC PANEL WITH GFR  Result Value Ref Range   Sodium 140 135 - 146 mmol/L   Potassium 4.1 3.5 - 5.3 mmol/L   Chloride 110 98 - 110 mmol/L   CO2 21 20 - 31 mmol/L   Glucose, Bld 99 65 - 99 mg/dL   BUN 18 7 - 25 mg/dL   Creat 1.15 0.70 - 1.25 mg/dL   Total Bilirubin 0.4 0.2 - 1.2 mg/dL   Alkaline Phosphatase 47 40 - 115 U/L   AST 18 10 - 35 U/L   ALT 15 9 -  46 U/L   Total Protein 6.7 6.1 - 8.1 g/dL   Albumin 4.2 3.6 - 5.1 g/dL   Calcium 8.7 8.6 - 10.3 mg/dL   GFR, Est African American 80 >=60 mL/min   GFR, Est Non African American 69 >=60 mL/min  Lipid Profile  Result Value Ref Range   Cholesterol 117 (L) 125 - 200 mg/dL   Triglycerides 82 <150 mg/dL   HDL 39 (L) >=40 mg/dL   Total CHOL/HDL Ratio 3.0 <=5.0 Ratio   VLDL 16 <30 mg/dL   LDL Cholesterol 62 <130 mg/dL  HgB A1c  Result Value Ref Range   Hgb A1c MFr Bld 6.3 (H) <5.7 %   Mean Plasma Glucose 134 mg/dL      Assessment & Plan:   Encounter Diagnoses  Name Primary?  . Type 2 diabetes mellitus without complication, unspecified long term insulin use status ([place]) Yes  . Essential hypertension, benign   . Hyperlipidemia   . Obesity,  unspecified   . Heart murmur   . Premature contractions, atrial     -Reviewed labs with pt -Continue current meds -will order echo to evaluate murmur -f/u 3 months. RTO sooner prn

## 2016-05-29 ENCOUNTER — Other Ambulatory Visit: Payer: Self-pay | Admitting: Physician Assistant

## 2016-05-29 MED ORDER — METFORMIN HCL ER 500 MG PO TB24: 500.0000 mg | ORAL_TABLET | Freq: Every day | ORAL | 0 refills | Status: DC

## 2016-05-29 MED ORDER — LOSARTAN POTASSIUM 100 MG PO TABS: 100.0000 mg | ORAL_TABLET | Freq: Every day | ORAL | 0 refills | Status: DC

## 2016-08-12 ENCOUNTER — Other Ambulatory Visit: Payer: Self-pay

## 2016-08-12 DIAGNOSIS — E119 Type 2 diabetes mellitus without complications: Secondary | ICD-10-CM

## 2016-08-12 DIAGNOSIS — E785 Hyperlipidemia, unspecified: Secondary | ICD-10-CM

## 2016-08-12 DIAGNOSIS — I10 Essential (primary) hypertension: Secondary | ICD-10-CM

## 2016-08-13 LAB — COMPLETE METABOLIC PANEL WITH GFR
ALT: 16 U/L (ref 9–46)
AST: 19 U/L (ref 10–35)
Albumin: 4.2 g/dL (ref 3.6–5.1)
Alkaline Phosphatase: 48 U/L (ref 40–115)
BUN: 19 mg/dL (ref 7–25)
CALCIUM: 9.3 mg/dL (ref 8.6–10.3)
CHLORIDE: 105 mmol/L (ref 98–110)
CO2: 22 mmol/L (ref 20–31)
CREATININE: 1.02 mg/dL (ref 0.70–1.25)
GFR, EST NON AFRICAN AMERICAN: 80 mL/min (ref >=60)
Glucose, Bld: 102 mg/dL — ABNORMAL HIGH (ref 65–99)
POTASSIUM: 3.9 mmol/L (ref 3.5–5.3)
Sodium: 140 mmol/L (ref 135–146)
Total Bilirubin: 0.4 mg/dL (ref 0.2–1.2)
Total Protein: 7 g/dL (ref 6.1–8.1)

## 2016-08-13 LAB — LIPID PANEL
CHOLESTEROL: 130 mg/dL (ref 125–200)
HDL: 38 mg/dL — AB (ref >=40)
LDL CALC: 74 mg/dL (ref <130)
TRIGLYCERIDES: 92 mg/dL (ref <150)
Total CHOL/HDL Ratio: 3.4 Ratio (ref <=5.0)
VLDL: 18 mg/dL (ref <30)

## 2016-08-13 LAB — HEMOGLOBIN A1C
HEMOGLOBIN A1C: 6.1 % — AB (ref <5.7)
MEAN PLASMA GLUCOSE: 128 mg/dL

## 2016-08-14 LAB — MICROALBUMIN, URINE: MICROALB UR: 2.6 mg/dL

## 2016-08-18 ENCOUNTER — Ambulatory Visit: Payer: Self-pay | Admitting: Physician Assistant

## 2016-08-18 ENCOUNTER — Encounter: Payer: Self-pay | Admitting: Physician Assistant

## 2016-08-18 VITALS — BP 140/93 | HR 74 | Temp 97.9°F

## 2016-08-18 DIAGNOSIS — I10 Essential (primary) hypertension: Secondary | ICD-10-CM

## 2016-08-18 DIAGNOSIS — E785 Hyperlipidemia, unspecified: Secondary | ICD-10-CM

## 2016-08-18 DIAGNOSIS — E119 Type 2 diabetes mellitus without complications: Secondary | ICD-10-CM

## 2016-08-18 DIAGNOSIS — Z125 Encounter for screening for malignant neoplasm of prostate: Secondary | ICD-10-CM

## 2016-08-18 NOTE — Progress Notes (Signed)
BP (!) 140/93   Pulse 74   Temp 97.9 F (36.6 C)   SpO2 95%    Subjective:    Patient ID: Jack Cherry, male    DOB: 10/17/1956, 60 y.o.   MRN: [id number]  HPI: Jack Cherry is a 60 y.o. male presenting on 08/18/2016 for Diabetes and Hypertension   HPI   Pt out of his atenolol and loratidine for a few days.  He says he has refills waiting for him to pick up at [facility].  Pt says he is doing well.  Denies any problems.  Relevant past medical, surgical, family and social history reviewed and updated as indicated. Interim medical history since our last visit reviewed. Allergies and medications reviewed and updated.   Current Outpatient Prescriptions:  .  amLODipine (NORVASC) 10 MG tablet, Take 1 tablet (10 mg total) by mouth daily. for blood pressure, Disp: 90 tablet, Rfl: 1 .  atorvastatin (LIPITOR) 10 MG tablet, Take 1 tablet (10 mg total) by mouth daily., Disp: 90 tablet, Rfl: 1 .  losartan (COZAAR) 100 MG tablet, Take 1 tablet (100 mg total) by mouth daily., Disp: 30 tablet, Rfl: 0 .  metFORMIN (GLUCOPHAGE XR) 500 MG 24 hr tablet, Take 1 tablet (500 mg total) by mouth daily with breakfast., Disp: 30 tablet, Rfl: 0 .  atenolol (TENORMIN) 100 MG tablet, TAKE ONE TABLET BY MOUTH ONCE DAILY (Patient not taking: Reported on 08/18/2016), Disp: 30 tablet, Rfl: 4 .  loratadine (CLARITIN) 10 MG tablet, Take 1 tablet (10 mg total) by mouth daily as needed. (Patient not taking: Reported on 08/18/2016), Disp: 30 tablet, Rfl: 3   Review of Systems  Constitutional: Negative for appetite change, chills, diaphoresis, fatigue, fever and unexpected weight change.  HENT: Positive for sneezing. Negative for congestion, dental problem, drooling, ear pain, facial swelling, hearing loss, mouth sores, sore throat, trouble swallowing and voice change.   Eyes: Negative for pain, discharge, redness, itching and visual disturbance.  Respiratory: Positive for cough. Negative for choking, shortness of  breath and wheezing.   Cardiovascular: Negative for chest pain, palpitations and leg swelling.  Gastrointestinal: Negative for abdominal pain, blood in stool, constipation, diarrhea and vomiting.  Endocrine: Negative for cold intolerance, heat intolerance and polydipsia.  Genitourinary: Negative for decreased urine volume, dysuria and hematuria.  Musculoskeletal: Positive for back pain. Negative for arthralgias and gait problem.  Skin: Negative for rash.  Allergic/Immunologic: Positive for environmental allergies.  Neurological: Negative for seizures, syncope, light-headedness and headaches.  Hematological: Negative for adenopathy.  Psychiatric/Behavioral: Negative for agitation, dysphoric mood and suicidal ideas. The patient is not nervous/anxious.     Per HPI unless specifically indicated above     Objective:    BP (!) 140/93   Pulse 74   Temp 97.9 F (36.6 C)   SpO2 95%   Wt Readings from Last 3 Encounters:  05/19/16 250 lb (113.4 kg)  03/17/16 248 lb (112.5 kg)  02/18/16 250 lb (113.4 kg)    Physical Exam  Constitutional: He is oriented to person, place, and time. He appears well-developed and well-nourished.  HENT:  Head: Normocephalic and atraumatic.  Neck: Neck supple.  Cardiovascular: Normal rate and regular rhythm.   Pulmonary/Chest: Effort normal and breath sounds normal. He has no wheezes.  Abdominal: Soft. Bowel sounds are normal. There is no hepatosplenomegaly. There is no tenderness.  Musculoskeletal: He exhibits no edema.  Lymphadenopathy:    He has no cervical adenopathy.  Neurological: He is alert and oriented to person, place,  and time.  Skin: Skin is warm and dry.  Psychiatric: He has a normal mood and affect. His behavior is normal.  Vitals reviewed.   Results for orders placed or performed in visit on 08/12/16  HgB A1c  Result Value Ref Range   Hgb A1c MFr Bld 6.1 (H) <5.7 %   Mean Plasma Glucose 128 mg/dL  Microalbumin, urine  Result Value Ref  Range   Microalb, Ur 2.6 Not estab mg/dL  COMPLETE METABOLIC PANEL WITH GFR  Result Value Ref Range   Sodium 140 135 - 146 mmol/L   Potassium 3.9 3.5 - 5.3 mmol/L   Chloride 105 98 - 110 mmol/L   CO2 22 20 - 31 mmol/L   Glucose, Bld 102 (H) 65 - 99 mg/dL   BUN 19 7 - 25 mg/dL   Creat 1.02 0.70 - 1.25 mg/dL   Total Bilirubin 0.4 0.2 - 1.2 mg/dL   Alkaline Phosphatase 48 40 - 115 U/L   AST 19 10 - 35 U/L   ALT 16 9 - 46 U/L   Total Protein 7.0 6.1 - 8.1 g/dL   Albumin 4.2 3.6 - 5.1 g/dL   Calcium 9.3 8.6 - 10.3 mg/dL   GFR, Est African American >89 >=60 mL/min   GFR, Est Non African American 80 >=60 mL/min  Lipid Profile  Result Value Ref Range   Cholesterol 130 125 - 200 mg/dL   Triglycerides 92 <150 mg/dL   HDL 38 (L) >=40 mg/dL   Total CHOL/HDL Ratio 3.4 <=5.0 Ratio   VLDL 18 <30 mg/dL   LDL Cholesterol 74 <130 mg/dL      Assessment & Plan:   Encounter Diagnoses  Name Primary?  . Type 2 diabetes mellitus without complication, unspecified long term insulin use status ([place]) Yes  . Essential hypertension, benign   . Hyperlipidemia, unspecified hyperlipidemia type   . Screening for prostate cancer      -reviewed labs with pt -urged pt to Get back on meds from [facility] (atenolol and claritan) -continue other meds -F/u 3 months.  RTO sooner prn

## 2016-10-03 ENCOUNTER — Encounter: Payer: Self-pay | Admitting: Physician Assistant

## 2016-10-06 ENCOUNTER — Other Ambulatory Visit: Payer: Self-pay | Admitting: Physician Assistant

## 2016-10-06 MED ORDER — LOSARTAN POTASSIUM 100 MG PO TABS: 100.0000 mg | ORAL_TABLET | Freq: Every day | ORAL | 2 refills | Status: DC

## 2016-10-06 MED ORDER — AMLODIPINE BESYLATE 10 MG PO TABS: 10.0000 mg | ORAL_TABLET | Freq: Every day | ORAL | 1 refills | Status: DC

## 2016-10-06 MED ORDER — METFORMIN HCL ER 500 MG PO TB24: 500.0000 mg | ORAL_TABLET | Freq: Every day | ORAL | 1 refills | Status: DC

## 2016-10-06 MED ORDER — ATORVASTATIN CALCIUM 10 MG PO TABS: 10.0000 mg | ORAL_TABLET | Freq: Every day | ORAL | 1 refills | Status: DC

## 2016-10-06 MED ORDER — LOSARTAN POTASSIUM 100 MG PO TABS: 100.0000 mg | ORAL_TABLET | Freq: Every day | ORAL | 0 refills | Status: DC

## 2016-11-11 ENCOUNTER — Other Ambulatory Visit: Payer: Self-pay

## 2016-11-11 DIAGNOSIS — E119 Type 2 diabetes mellitus without complications: Secondary | ICD-10-CM

## 2016-11-11 DIAGNOSIS — Z125 Encounter for screening for malignant neoplasm of prostate: Secondary | ICD-10-CM

## 2016-11-11 DIAGNOSIS — I10 Essential (primary) hypertension: Secondary | ICD-10-CM

## 2016-11-11 DIAGNOSIS — E785 Hyperlipidemia, unspecified: Secondary | ICD-10-CM

## 2016-11-12 LAB — COMPREHENSIVE METABOLIC PANEL
ALT: 18 U/L (ref 9–46)
AST: 20 U/L (ref 10–35)
Albumin: 4.2 g/dL (ref 3.6–5.1)
Alkaline Phosphatase: 52 U/L (ref 40–115)
BUN: 18 mg/dL (ref 7–25)
CHLORIDE: 107 mmol/L (ref 98–110)
CO2: 25 mmol/L (ref 20–31)
CREATININE: 1.01 mg/dL (ref 0.70–1.25)
Calcium: 9.6 mg/dL (ref 8.6–10.3)
GLUCOSE: 99 mg/dL (ref 65–99)
Potassium: 4.2 mmol/L (ref 3.5–5.3)
SODIUM: 141 mmol/L (ref 135–146)
TOTAL PROTEIN: 7.3 g/dL (ref 6.1–8.1)
Total Bilirubin: 0.5 mg/dL (ref 0.2–1.2)

## 2016-11-12 LAB — LIPID PANEL
CHOL/HDL RATIO: 3.9 ratio (ref <5.0)
Cholesterol: 147 mg/dL (ref <200)
HDL: 38 mg/dL — ABNORMAL LOW (ref >40)
LDL CALC: 91 mg/dL (ref <100)
Triglycerides: 92 mg/dL (ref <150)
VLDL: 18 mg/dL (ref <30)

## 2016-11-12 LAB — PSA: PSA: 2.8 ng/mL (ref <=4.0)

## 2016-11-13 LAB — HEMOGLOBIN A1C
HEMOGLOBIN A1C: 6.2 % — AB (ref <5.7)
MEAN PLASMA GLUCOSE: 131 mg/dL

## 2016-11-17 ENCOUNTER — Ambulatory Visit: Payer: Self-pay | Admitting: Physician Assistant

## 2016-11-17 ENCOUNTER — Encounter: Payer: Self-pay | Admitting: Physician Assistant

## 2016-11-17 VITALS — BP 158/102 | HR 89 | Temp 97.9°F | Ht 71.0 in | Wt 255.0 lb

## 2016-11-17 DIAGNOSIS — E119 Type 2 diabetes mellitus without complications: Secondary | ICD-10-CM

## 2016-11-17 DIAGNOSIS — E669 Obesity, unspecified: Secondary | ICD-10-CM

## 2016-11-17 DIAGNOSIS — Z6835 Body mass index (BMI) 35.0-35.9, adult: Secondary | ICD-10-CM

## 2016-11-17 DIAGNOSIS — E785 Hyperlipidemia, unspecified: Secondary | ICD-10-CM

## 2016-11-17 DIAGNOSIS — I10 Essential (primary) hypertension: Secondary | ICD-10-CM

## 2016-11-17 MED ORDER — HYDROCHLOROTHIAZIDE 12.5 MG PO CAPS: 12.5000 mg | ORAL_CAPSULE | Freq: Every day | ORAL | 1 refills | Status: DC

## 2016-11-17 NOTE — Progress Notes (Signed)
BP (!) 148/90 (BP Location: Left Arm, Patient Position: Sitting, Cuff Size: Normal)   Pulse 89   Temp 97.9 F (36.6 C)   Ht 5\' 11"  (1.803 m)   Wt 255 lb (115.7 kg)   SpO2 96%   BMI 35.57 kg/m    Subjective:    Patient ID: Jack Cherry, male    DOB: 1956/07/13, 61 y.o.   MRN: [id number]  HPI: Jack Cherry is a 61 y.o. male presenting on 11/17/2016 for Diabetes and Hypertension   HPI   Pt is doing well.  He has no complaints today.  He says he is walking a lot,   He says.  Says he only occasionally has a little bit of cough.  Relevant past medical, surgical, family and social history reviewed and updated as indicated. Interim medical history since our last visit reviewed. Allergies and medications reviewed and updated.   Current Outpatient Prescriptions:  .  amLODipine (NORVASC) 10 MG tablet, Take 1 tablet (10 mg total) by mouth daily. for blood pressure, Disp: 30 tablet, Rfl: 1 .  atenolol (TENORMIN) 100 MG tablet, TAKE ONE TABLET BY MOUTH ONCE DAILY, Disp: 30 tablet, Rfl: 4 .  atorvastatin (LIPITOR) 10 MG tablet, Take 1 tablet (10 mg total) by mouth daily., Disp: 90 tablet, Rfl: 1 .  loratadine (CLARITIN) 10 MG tablet, Take 1 tablet (10 mg total) by mouth daily as needed., Disp: 30 tablet, Rfl: 3 .  losartan (COZAAR) 100 MG tablet, Take 1 tablet (100 mg total) by mouth daily., Disp: 30 tablet, Rfl: 0 .  metFORMIN (GLUCOPHAGE XR) 500 MG 24 hr tablet, Take 1 tablet (500 mg total) by mouth daily with breakfast., Disp: 90 tablet, Rfl: 1   Review of Systems  Constitutional: Negative for fatigue and fever.  HENT: Negative for ear pain and sore throat.   Respiratory: Positive for cough. Negative for shortness of breath and wheezing.   Cardiovascular: Negative for chest pain.  Gastrointestinal: Negative for abdominal pain, diarrhea and vomiting.  Neurological: Negative for dizziness.    Per HPI unless specifically indicated above     Objective:    BP (!) 148/90 (BP  Location: Left Arm, Patient Position: Sitting, Cuff Size: Normal)   Pulse 89   Temp 97.9 F (36.6 C)   Ht 5\' 11"  (1.803 m)   Wt 255 lb (115.7 kg)   SpO2 96%   BMI 35.57 kg/m   Wt Readings from Last 3 Encounters:  11/17/16 255 lb (115.7 kg)  05/19/16 250 lb (113.4 kg)  03/17/16 248 lb (112.5 kg)    Physical Exam  Constitutional: He is oriented to person, place, and time. He appears well-developed and well-nourished.  HENT:  Head: Normocephalic and atraumatic.  Neck: Neck supple.  Cardiovascular: Normal rate and regular rhythm.   Pulmonary/Chest: Effort normal and breath sounds normal. He has no wheezes.  Abdominal: Soft. Bowel sounds are normal. There is no hepatosplenomegaly. There is no tenderness.  Musculoskeletal: He exhibits no edema.  Lymphadenopathy:    He has no cervical adenopathy.  Neurological: He is alert and oriented to person, place, and time.  Skin: Skin is warm and dry.  Psychiatric: He has a normal mood and affect. His behavior is normal.  Vitals reviewed.   Results for orders placed or performed in visit on 11/11/16  HgB A1c  Result Value Ref Range   Hgb A1c MFr Bld 6.2 (H) <5.7 %   Mean Plasma Glucose 131 mg/dL  PSA  Result Value Ref Range  PSA 2.8 <=4.0 ng/mL  Comprehensive Metabolic Panel (CMET)  Result Value Ref Range   Sodium 141 135 - 146 mmol/L   Potassium 4.2 3.5 - 5.3 mmol/L   Chloride 107 98 - 110 mmol/L   CO2 25 20 - 31 mmol/L   Glucose, Bld 99 65 - 99 mg/dL   BUN 18 7 - 25 mg/dL   Creat 1.01 0.70 - 1.25 mg/dL   Total Bilirubin 0.5 0.2 - 1.2 mg/dL   Alkaline Phosphatase 52 40 - 115 U/L   AST 20 10 - 35 U/L   ALT 18 9 - 46 U/L   Total Protein 7.3 6.1 - 8.1 g/dL   Albumin 4.2 3.6 - 5.1 g/dL   Calcium 9.6 8.6 - 10.3 mg/dL  Lipid Profile  Result Value Ref Range   Cholesterol 147 <200 mg/dL   Triglycerides 92 <150 mg/dL   HDL 38 (L) >40 mg/dL   Total CHOL/HDL Ratio 3.9 <5.0 Ratio   VLDL 18 <30 mg/dL   LDL Cholesterol 91 <100 mg/dL       Assessment & Plan:   Encounter Diagnoses  Name Primary?  . Type 2 diabetes mellitus without complication, unspecified long term insulin use status ([place]) Yes  . Essential hypertension, benign   . Hyperlipidemia, unspecified hyperlipidemia type   . Class 2 obesity with body mass index (BMI) of 35.0 to 35.9 in adult, unspecified obesity type, unspecified whether serious comorbidity present     -reviewed labs with pt -Add hctz.   -continue current medications -Recheck BP 1 month

## 2016-12-22 ENCOUNTER — Ambulatory Visit: Payer: Self-pay | Admitting: Physician Assistant

## 2016-12-22 ENCOUNTER — Encounter: Payer: Self-pay | Admitting: Physician Assistant

## 2016-12-22 VITALS — BP 150/80 | HR 60 | Temp 98.1°F | Ht 71.0 in | Wt 255.8 lb

## 2016-12-22 DIAGNOSIS — I10 Essential (primary) hypertension: Secondary | ICD-10-CM

## 2016-12-22 MED ORDER — HYDROCHLOROTHIAZIDE 25 MG PO TABS: 25.0000 mg | ORAL_TABLET | Freq: Every day | ORAL | 3 refills | Status: DC

## 2016-12-22 NOTE — Progress Notes (Signed)
BP (!) 150/80 (BP Location: Right Arm, Patient Position: Sitting, Cuff Size: Large)   Pulse 60   Temp 98.1 F (36.7 C)   Ht 5\' 11"  (1.803 m)   Wt 255 lb 12 oz (116 kg)   SpO2 97%   BMI 35.67 kg/m    Subjective:    Patient ID: Jack Cherry, male    DOB: 1956-04-05, 61 y.o.   MRN: [id number]  HPI: Jack Cherry is a 61 y.o. male presenting on 12/22/2016 for Hypertension   HPI Pt feeling well today  Relevant past medical, surgical, family and social history reviewed and updated as indicated. Interim medical history since our last visit reviewed. Allergies and medications reviewed and updated.   Current Outpatient Prescriptions:  .  amLODipine (NORVASC) 10 MG tablet, Take 1 tablet (10 mg total) by mouth daily. for blood pressure, Disp: 30 tablet, Rfl: 1 .  atenolol (TENORMIN) 100 MG tablet, TAKE ONE TABLET BY MOUTH ONCE DAILY, Disp: 30 tablet, Rfl: 4 .  atorvastatin (LIPITOR) 10 MG tablet, Take 1 tablet (10 mg total) by mouth daily., Disp: 90 tablet, Rfl: 1 .  hydrochlorothiazide (MICROZIDE) 12.5 MG capsule, Take 1 capsule (12.5 mg total) by mouth daily., Disp: 30 capsule, Rfl: 1 .  loratadine (CLARITIN) 10 MG tablet, Take 1 tablet (10 mg total) by mouth daily as needed., Disp: 30 tablet, Rfl: 3 .  losartan (COZAAR) 100 MG tablet, Take 1 tablet (100 mg total) by mouth daily., Disp: 30 tablet, Rfl: 0 .  metFORMIN (GLUCOPHAGE XR) 500 MG 24 hr tablet, Take 1 tablet (500 mg total) by mouth daily with breakfast., Disp: 90 tablet, Rfl: 1   Review of Systems  Constitutional: Negative for appetite change, chills, diaphoresis, fatigue, fever and unexpected weight change.  HENT: Positive for sneezing. Negative for congestion, dental problem, drooling, ear pain, facial swelling, hearing loss, mouth sores, sore throat, trouble swallowing and voice change.   Eyes: Negative for pain, discharge, redness, itching and visual disturbance.  Respiratory: Positive for cough. Negative for choking,  shortness of breath and wheezing.   Cardiovascular: Negative for chest pain, palpitations and leg swelling.  Gastrointestinal: Negative for abdominal pain, blood in stool, constipation, diarrhea and vomiting.  Endocrine: Negative for cold intolerance, heat intolerance and polydipsia.  Genitourinary: Negative for decreased urine volume, dysuria and hematuria.  Musculoskeletal: Negative for arthralgias, back pain and gait problem.  Skin: Negative for rash.  Allergic/Immunologic: Negative for environmental allergies.  Neurological: Negative for seizures, syncope, light-headedness and headaches.  Hematological: Negative for adenopathy.  Psychiatric/Behavioral: Negative for agitation, dysphoric mood and suicidal ideas. The patient is not nervous/anxious.     Per HPI unless specifically indicated above     Objective:    BP (!) 150/80 (BP Location: Right Arm, Patient Position: Sitting, Cuff Size: Large)   Pulse 60   Temp 98.1 F (36.7 C)   Ht 5\' 11"  (1.803 m)   Wt 255 lb 12 oz (116 kg)   SpO2 97%   BMI 35.67 kg/m   Wt Readings from Last 3 Encounters:  12/22/16 255 lb 12 oz (116 kg)  11/17/16 255 lb (115.7 kg)  05/19/16 250 lb (113.4 kg)    Physical Exam  Constitutional: He is oriented to person, place, and time. He appears well-developed and well-nourished.  HENT:  Head: Normocephalic and atraumatic.  Neck: Neck supple.  Cardiovascular: Normal rate and regular rhythm.   Pulmonary/Chest: Effort normal and breath sounds normal. He has no wheezes.  Abdominal: Soft. Bowel sounds are  normal. There is no hepatosplenomegaly. There is no tenderness.  Musculoskeletal: He exhibits no edema.  Lymphadenopathy:    He has no cervical adenopathy.  Neurological: He is alert and oriented to person, place, and time.  Skin: Skin is warm and dry.  Psychiatric: He has a normal mood and affect. His behavior is normal.  Vitals reviewed.   Results for orders placed or performed in visit on 11/11/16   HgB A1c  Result Value Ref Range   Hgb A1c MFr Bld 6.2 (H) <5.7 %   Mean Plasma Glucose 131 mg/dL  PSA  Result Value Ref Range   PSA 2.8 <=4.0 ng/mL  Comprehensive Metabolic Panel (CMET)  Result Value Ref Range   Sodium 141 135 - 146 mmol/L   Potassium 4.2 3.5 - 5.3 mmol/L   Chloride 107 98 - 110 mmol/L   CO2 25 20 - 31 mmol/L   Glucose, Bld 99 65 - 99 mg/dL   BUN 18 7 - 25 mg/dL   Creat 1.01 0.70 - 1.25 mg/dL   Total Bilirubin 0.5 0.2 - 1.2 mg/dL   Alkaline Phosphatase 52 40 - 115 U/L   AST 20 10 - 35 U/L   ALT 18 9 - 46 U/L   Total Protein 7.3 6.1 - 8.1 g/dL   Albumin 4.2 3.6 - 5.1 g/dL   Calcium 9.6 8.6 - 10.3 mg/dL  Lipid Profile  Result Value Ref Range   Cholesterol 147 <200 mg/dL   Triglycerides 92 <150 mg/dL   HDL 38 (L) >40 mg/dL   Total CHOL/HDL Ratio 3.9 <5.0 Ratio   VLDL 18 <30 mg/dL   LDL Cholesterol 91 <100 mg/dL      Assessment & Plan:   Encounter Diagnosis  Name Primary?  . Essential hypertension, benign Yes    Increase hctz to 25mg  qd Follow up one month to recheck BP.  RTO sooner prn

## 2017-01-19 ENCOUNTER — Ambulatory Visit: Payer: Self-pay | Admitting: Physician Assistant

## 2017-01-19 ENCOUNTER — Encounter: Payer: Self-pay | Admitting: Physician Assistant

## 2017-01-19 VITALS — BP 136/90 | HR 95 | Temp 98.1°F | Ht 71.0 in | Wt 259.2 lb

## 2017-01-19 DIAGNOSIS — E119 Type 2 diabetes mellitus without complications: Secondary | ICD-10-CM

## 2017-01-19 DIAGNOSIS — E785 Hyperlipidemia, unspecified: Secondary | ICD-10-CM

## 2017-01-19 DIAGNOSIS — I10 Essential (primary) hypertension: Secondary | ICD-10-CM

## 2017-01-19 DIAGNOSIS — Z6836 Body mass index (BMI) 36.0-36.9, adult: Secondary | ICD-10-CM

## 2017-01-19 DIAGNOSIS — E669 Obesity, unspecified: Secondary | ICD-10-CM

## 2017-01-19 NOTE — Progress Notes (Signed)
BP 136/90 (BP Location: Left Arm, Patient Position: Sitting, Cuff Size: Large)   Pulse 95   Temp 98.1 F (36.7 C)   Ht 5\' 11"  (1.803 m)   Wt 259 lb 4 oz (117.6 kg)   SpO2 94%   BMI 36.16 kg/m    Subjective:    Patient ID: Jack Cherry, male    DOB: February 06, 1956, 62 y.o.   MRN: [id number]  HPI: Jack Cherry is a 61 y.o. male presenting on 01/19/2017 for Hypertension   HPI   Pt is feeling well today  Relevant past medical, surgical, family and social history reviewed and updated as indicated. Interim medical history since our last visit reviewed. Allergies and medications reviewed and updated.   Current Outpatient Prescriptions:  .  atenolol (TENORMIN) 100 MG tablet, TAKE ONE TABLET BY MOUTH ONCE DAILY, Disp: 30 tablet, Rfl: 4 .  hydrochlorothiazide (HYDRODIURIL) 25 MG tablet, Take 1 tablet (25 mg total) by mouth daily., Disp: 30 tablet, Rfl: 3 .  loratadine (CLARITIN) 10 MG tablet, Take 1 tablet (10 mg total) by mouth daily as needed., Disp: 30 tablet, Rfl: 3 .  amLODipine (NORVASC) 10 MG tablet, Take 1 tablet (10 mg total) by mouth daily. for blood pressure (Patient not taking: Reported on 01/19/2017), Disp: 30 tablet, Rfl: 1 .  atorvastatin (LIPITOR) 10 MG tablet, Take 1 tablet (10 mg total) by mouth daily. (Patient not taking: Reported on 01/19/2017), Disp: 90 tablet, Rfl: 1 .  losartan (COZAAR) 100 MG tablet, Take 1 tablet (100 mg total) by mouth daily. (Patient not taking: Reported on 01/19/2017), Disp: 30 tablet, Rfl: 0 .  metFORMIN (GLUCOPHAGE XR) 500 MG 24 hr tablet, Take 1 tablet (500 mg total) by mouth daily with breakfast. (Patient not taking: Reported on 01/19/2017), Disp: 90 tablet, Rfl: 1   Review of Systems  Constitutional: Negative for fever.  Respiratory: Negative for shortness of breath.   Cardiovascular: Negative for chest pain.  Gastrointestinal: Negative for abdominal pain.    Per HPI unless specifically indicated above     Objective:    BP 136/90 (BP  Location: Left Arm, Patient Position: Sitting, Cuff Size: Large)   Pulse 95   Temp 98.1 F (36.7 C)   Ht 5\' 11"  (1.803 m)   Wt 259 lb 4 oz (117.6 kg)   SpO2 94%   BMI 36.16 kg/m   Wt Readings from Last 3 Encounters:  01/19/17 259 lb 4 oz (117.6 kg)  12/22/16 255 lb 12 oz (116 kg)  11/17/16 255 lb (115.7 kg)    Physical Exam  Constitutional: He is oriented to person, place, and time. He appears well-developed and well-nourished.  HENT:  Head: Normocephalic and atraumatic.  Neck: Neck supple.  Cardiovascular: Normal rate and regular rhythm.   Pulmonary/Chest: Effort normal and breath sounds normal. He has no wheezes.  Abdominal: Soft. Bowel sounds are normal. There is no hepatosplenomegaly. There is no tenderness.  Musculoskeletal: He exhibits no edema.  Lymphadenopathy:    He has no cervical adenopathy.  Neurological: He is alert and oriented to person, place, and time.  Skin: Skin is warm and dry.  Psychiatric: He has a normal mood and affect. His behavior is normal.  Vitals reviewed.   Results for orders placed or performed in visit on 11/11/16  HgB A1c  Result Value Ref Range   Hgb A1c MFr Bld 6.2 (H) <5.7 %   Mean Plasma Glucose 131 mg/dL  PSA  Result Value Ref Range   PSA 2.8 <=  4.0 ng/mL  Comprehensive Metabolic Panel (CMET)  Result Value Ref Range   Sodium 141 135 - 146 mmol/L   Potassium 4.2 3.5 - 5.3 mmol/L   Chloride 107 98 - 110 mmol/L   CO2 25 20 - 31 mmol/L   Glucose, Bld 99 65 - 99 mg/dL   BUN 18 7 - 25 mg/dL   Creat 1.01 0.70 - 1.25 mg/dL   Total Bilirubin 0.5 0.2 - 1.2 mg/dL   Alkaline Phosphatase 52 40 - 115 U/L   AST 20 10 - 35 U/L   ALT 18 9 - 46 U/L   Total Protein 7.3 6.1 - 8.1 g/dL   Albumin 4.2 3.6 - 5.1 g/dL   Calcium 9.6 8.6 - 10.3 mg/dL  Lipid Profile  Result Value Ref Range   Cholesterol 147 <200 mg/dL   Triglycerides 92 <150 mg/dL   HDL 38 (L) >40 mg/dL   Total CHOL/HDL Ratio 3.9 <5.0 Ratio   VLDL 18 <30 mg/dL   LDL Cholesterol  91 <100 mg/dL      Assessment & Plan:   Encounter Diagnoses  Name Primary?  . Essential hypertension, benign Yes  . Type 2 diabetes mellitus without complication, unspecified long term insulin use status ([place])   . Hyperlipidemia, unspecified hyperlipidemia type   . Class 2 obesity with body mass index (BMI) of 36.0 to 36.9 in adult, unspecified obesity type, unspecified whether serious comorbidity present      -pt to continue current medications -follow up 2 months.  RTO sooner prn

## 2017-01-20 ENCOUNTER — Other Ambulatory Visit: Payer: Self-pay | Admitting: Physician Assistant

## 2017-01-20 MED ORDER — LORATADINE 10 MG PO TABS: 10.0000 mg | ORAL_TABLET | Freq: Every day | ORAL | 3 refills | Status: DC | PRN

## 2017-01-20 MED ORDER — AMLODIPINE BESYLATE 10 MG PO TABS: 10.0000 mg | ORAL_TABLET | Freq: Every day | ORAL | 3 refills | Status: DC

## 2017-01-20 MED ORDER — LOSARTAN POTASSIUM 100 MG PO TABS: 100.0000 mg | ORAL_TABLET | Freq: Every day | ORAL | 3 refills | Status: DC

## 2017-01-20 MED ORDER — HYDROCHLOROTHIAZIDE 25 MG PO TABS: 25.0000 mg | ORAL_TABLET | Freq: Every day | ORAL | 3 refills | Status: DC

## 2017-01-20 MED ORDER — METFORMIN HCL ER 500 MG PO TB24: 500.0000 mg | ORAL_TABLET | Freq: Every day | ORAL | 3 refills | Status: DC

## 2017-01-20 MED ORDER — ATORVASTATIN CALCIUM 10 MG PO TABS: 10.0000 mg | ORAL_TABLET | Freq: Every day | ORAL | 3 refills | Status: DC

## 2017-02-18 ENCOUNTER — Other Ambulatory Visit: Payer: Self-pay | Admitting: Physician Assistant

## 2017-02-18 MED ORDER — LOSARTAN POTASSIUM 100 MG PO TABS: 100.0000 mg | ORAL_TABLET | Freq: Every day | ORAL | 3 refills | Status: DC

## 2017-02-18 MED ORDER — ATENOLOL 100 MG PO TABS: 100.0000 mg | ORAL_TABLET | Freq: Every day | ORAL | 4 refills | Status: DC

## 2017-02-18 MED ORDER — AMLODIPINE BESYLATE 10 MG PO TABS: 10.0000 mg | ORAL_TABLET | Freq: Every day | ORAL | 3 refills | Status: DC

## 2017-02-18 MED ORDER — HYDROCHLOROTHIAZIDE 25 MG PO TABS: 25.0000 mg | ORAL_TABLET | Freq: Every day | ORAL | 3 refills | Status: DC

## 2017-03-16 ENCOUNTER — Other Ambulatory Visit (HOSPITAL_COMMUNITY)
Admission: RE | Admit: 2017-03-16 | Discharge: 2017-03-16 | Disposition: A | Discharge status: Home / Self Care | Payer: Self-pay | Source: Ambulatory Visit | Attending: Physician Assistant | Admitting: Physician Assistant

## 2017-03-16 LAB — COMPREHENSIVE METABOLIC PANEL
ALBUMIN: 3.9 g/dL (ref 3.5–5.0)
ALK PHOS: 44 U/L (ref 38–126)
ALT: 20 U/L (ref 17–63)
AST: 22 U/L (ref 15–41)
Anion gap: 7 (ref 5–15)
BILIRUBIN TOTAL: 0.4 mg/dL (ref 0.3–1.2)
BUN: 26 mg/dL — AB (ref 6–20)
CALCIUM: 9.4 mg/dL (ref 8.9–10.3)
CO2: 24 mmol/L (ref 22–32)
CREATININE: 1.24 mg/dL (ref 0.61–1.24)
Chloride: 107 mmol/L (ref 101–111)
GFR calc Af Amer: >60 mL/min (ref >60)
GFR calc non Af Amer: >60 mL/min (ref >60)
GLUCOSE: 110 mg/dL — AB (ref 65–99)
Potassium: 3.9 mmol/L (ref 3.5–5.1)
SODIUM: 138 mmol/L (ref 135–145)
TOTAL PROTEIN: 7.5 g/dL (ref 6.5–8.1)

## 2017-03-16 LAB — LIPID PANEL
CHOL/HDL RATIO: 5.2 ratio
Cholesterol: 162 mg/dL (ref 0–200)
HDL: 31 mg/dL — ABNORMAL LOW (ref >40)
LDL CALC: 98 mg/dL (ref 0–99)
TRIGLYCERIDES: 163 mg/dL — AB (ref <150)
VLDL: 33 mg/dL (ref 0–40)

## 2017-03-17 LAB — HEMOGLOBIN A1C
Hgb A1c MFr Bld: 6.7 % — ABNORMAL HIGH (ref 4.8–5.6)
MEAN PLASMA GLUCOSE: 146 mg/dL

## 2017-03-23 ENCOUNTER — Ambulatory Visit: Payer: Self-pay | Admitting: Physician Assistant

## 2017-03-23 ENCOUNTER — Other Ambulatory Visit: Payer: Self-pay | Admitting: Physician Assistant

## 2017-03-23 ENCOUNTER — Encounter: Payer: Self-pay | Admitting: Physician Assistant

## 2017-03-23 VITALS — BP 110/74 | HR 54 | Temp 97.5°F | Wt 256.0 lb

## 2017-03-23 DIAGNOSIS — Z1211 Encounter for screening for malignant neoplasm of colon: Secondary | ICD-10-CM

## 2017-03-23 DIAGNOSIS — E669 Obesity, unspecified: Secondary | ICD-10-CM

## 2017-03-23 DIAGNOSIS — R05 Cough: Secondary | ICD-10-CM

## 2017-03-23 DIAGNOSIS — I10 Essential (primary) hypertension: Secondary | ICD-10-CM

## 2017-03-23 DIAGNOSIS — E785 Hyperlipidemia, unspecified: Secondary | ICD-10-CM

## 2017-03-23 DIAGNOSIS — E119 Type 2 diabetes mellitus without complications: Secondary | ICD-10-CM

## 2017-03-23 DIAGNOSIS — R059 Cough, unspecified: Secondary | ICD-10-CM

## 2017-03-23 NOTE — Progress Notes (Signed)
BP 110/74   Pulse (!) 54   Temp 97.5 F (36.4 C)   Wt 256 lb (116.1 kg)   SpO2 97%   BMI 35.70 kg/m    Subjective:    Patient ID: Jack Cherry, male    DOB: Sep 13, 1956, 61 y.o.   MRN: [id number]  HPI: Jack Cherry is a 61 y.o. male presenting on 03/23/2017 for Diabetes and Hypertension   HPI   Pt says he is doing well.   Pt says his cough is better.  He says he coughs just every once in a while.   Relevant past medical, surgical, family and social history reviewed and updated as indicated. Interim medical history since our last visit reviewed. Allergies and medications reviewed and updated.   Current Outpatient Prescriptions:  .  amLODipine (NORVASC) 10 MG tablet, Take 1 tablet (10 mg total) by mouth daily. for blood pressure, Disp: 30 tablet, Rfl: 3 .  atenolol (TENORMIN) 100 MG tablet, Take 1 tablet (100 mg total) by mouth daily., Disp: 30 tablet, Rfl: 4 .  atorvastatin (LIPITOR) 10 MG tablet, Take 1 tablet (10 mg total) by mouth daily., Disp: 90 tablet, Rfl: 3 .  hydrochlorothiazide (HYDRODIURIL) 25 MG tablet, Take 1 tablet (25 mg total) by mouth daily., Disp: 30 tablet, Rfl: 3 .  loratadine (CLARITIN) 10 MG tablet, Take 1 tablet (10 mg total) by mouth daily as needed., Disp: 90 tablet, Rfl: 3 .  losartan (COZAAR) 100 MG tablet, Take 1 tablet (100 mg total) by mouth daily., Disp: 30 tablet, Rfl: 3 .  metFORMIN (GLUCOPHAGE XR) 500 MG 24 hr tablet, Take 1 tablet (500 mg total) by mouth daily with breakfast., Disp: 90 tablet, Rfl: 3   Review of Systems  Constitutional: Negative for appetite change, chills, diaphoresis, fatigue, fever and unexpected weight change.  HENT: Negative for congestion, drooling, ear pain, facial swelling, hearing loss, mouth sores, sneezing, sore throat, trouble swallowing and voice change.   Eyes: Negative for pain, discharge, redness, itching and visual disturbance.  Respiratory: Positive for cough. Negative for choking, shortness of breath and  wheezing.   Cardiovascular: Negative for chest pain, palpitations and leg swelling.  Gastrointestinal: Negative for abdominal pain, blood in stool, constipation, diarrhea and vomiting.  Endocrine: Negative for cold intolerance, heat intolerance and polydipsia.  Genitourinary: Negative for decreased urine volume, dysuria and hematuria.  Musculoskeletal: Negative for arthralgias, back pain and gait problem.  Skin: Negative for rash.  Allergic/Immunologic: Negative for environmental allergies.  Neurological: Negative for seizures, syncope, light-headedness and headaches.  Hematological: Negative for adenopathy.  Psychiatric/Behavioral: Negative for agitation, dysphoric mood and suicidal ideas. The patient is not nervous/anxious.     Per HPI unless specifically indicated above     Objective:    BP 110/74   Pulse (!) 54   Temp 97.5 F (36.4 C)   Wt 256 lb (116.1 kg)   SpO2 97%   BMI 35.70 kg/m   Wt Readings from Last 3 Encounters:  03/23/17 256 lb (116.1 kg)  01/19/17 259 lb 4 oz (117.6 kg)  12/22/16 255 lb 12 oz (116 kg)    Physical Exam  Constitutional: He is oriented to person, place, and time. He appears well-developed and well-nourished.  HENT:  Head: Normocephalic and atraumatic.  Neck: Neck supple.  Cardiovascular: Normal rate and regular rhythm.   Pulmonary/Chest: Effort normal and breath sounds normal. He has no wheezes.  Abdominal: Soft. Bowel sounds are normal. There is no hepatosplenomegaly. There is no tenderness.  Musculoskeletal: He  exhibits no edema.  Lymphadenopathy:    He has no cervical adenopathy.  Neurological: He is alert and oriented to person, place, and time.  Skin: Skin is warm and dry.  Psychiatric: He has a normal mood and affect. His behavior is normal.  Vitals reviewed.   Results for orders placed or performed during the hospital encounter of 03/16/17  Comprehensive metabolic panel  Result Value Ref Range   Sodium 138 135 - 145 mmol/L    Potassium 3.9 3.5 - 5.1 mmol/L   Chloride 107 101 - 111 mmol/L   CO2 24 22 - 32 mmol/L   Glucose, Bld 110 (H) 65 - 99 mg/dL   BUN 26 (H) 6 - 20 mg/dL   Creatinine, Ser 1.24 0.61 - 1.24 mg/dL   Calcium 9.4 8.9 - 10.3 mg/dL   Total Protein 7.5 6.5 - 8.1 g/dL   Albumin 3.9 3.5 - 5.0 g/dL   AST 22 15 - 41 U/L   ALT 20 17 - 63 U/L   Alkaline Phosphatase 44 38 - 126 U/L   Total Bilirubin 0.4 0.3 - 1.2 mg/dL   GFR calc non Af Amer >60 >60 mL/min   GFR calc Af Amer >60 >60 mL/min   Anion gap 7 5 - 15  Lipid panel  Result Value Ref Range   Cholesterol 162 0 - 200 mg/dL   Triglycerides 163 (H) <150 mg/dL   HDL 31 (L) >40 mg/dL   Total CHOL/HDL Ratio 5.2 RATIO   VLDL 33 0 - 40 mg/dL   LDL Cholesterol 98 0 - 99 mg/dL  Hemoglobin A1c  Result Value Ref Range   Hgb A1c MFr Bld 6.7 (H) 4.8 - 5.6 %   Mean Plasma Glucose 146 mg/dL      Assessment & Plan:   Encounter Diagnoses  Name Primary?  . Type 2 diabetes mellitus without complication, unspecified whether long term insulin use ([place]) Yes  . Essential hypertension, benign   . Hyperlipidemia, unspecified hyperlipidemia type   . Cough   . Special screening for malignant neoplasms, colon   . Obesity, unspecified classification, unspecified obesity type, unspecified whether serious comorbidity present     -reviewed labs with pt -pt will continue current medications -pt is given iFOBT for colon cancer screening -pt to follow up in 3 months. RTO sooner prn

## 2017-05-13 ENCOUNTER — Other Ambulatory Visit: Payer: Self-pay | Admitting: Physician Assistant

## 2017-05-13 DIAGNOSIS — I10 Essential (primary) hypertension: Secondary | ICD-10-CM

## 2017-05-13 DIAGNOSIS — E119 Type 2 diabetes mellitus without complications: Secondary | ICD-10-CM

## 2017-05-13 DIAGNOSIS — E785 Hyperlipidemia, unspecified: Secondary | ICD-10-CM

## 2017-05-13 DIAGNOSIS — Z862 Personal history of diseases of the blood and blood-forming organs and certain disorders involving the immune mechanism: Secondary | ICD-10-CM

## 2017-06-22 ENCOUNTER — Ambulatory Visit: Payer: Self-pay | Admitting: Physician Assistant

## 2017-07-08 ENCOUNTER — Other Ambulatory Visit: Payer: Self-pay | Admitting: Physician Assistant

## 2017-07-08 DIAGNOSIS — Z1211 Encounter for screening for malignant neoplasm of colon: Secondary | ICD-10-CM

## 2017-07-08 LAB — IFOBT (OCCULT BLOOD): IFOBT: POSITIVE

## 2017-07-13 ENCOUNTER — Other Ambulatory Visit (HOSPITAL_COMMUNITY)
Admission: RE | Admit: 2017-07-13 | Discharge: 2017-07-13 | Disposition: A | Discharge status: Home / Self Care | Payer: Self-pay | Source: Ambulatory Visit | Attending: Physician Assistant | Admitting: Physician Assistant

## 2017-07-13 DIAGNOSIS — Z862 Personal history of diseases of the blood and blood-forming organs and certain disorders involving the immune mechanism: Secondary | ICD-10-CM | POA: Insufficient documentation

## 2017-07-13 DIAGNOSIS — E119 Type 2 diabetes mellitus without complications: Secondary | ICD-10-CM | POA: Insufficient documentation

## 2017-07-13 DIAGNOSIS — I10 Essential (primary) hypertension: Secondary | ICD-10-CM | POA: Insufficient documentation

## 2017-07-13 DIAGNOSIS — E785 Hyperlipidemia, unspecified: Secondary | ICD-10-CM | POA: Insufficient documentation

## 2017-07-13 LAB — COMPREHENSIVE METABOLIC PANEL
ALT: 22 U/L (ref 17–63)
ANION GAP: 8 (ref 5–15)
AST: 26 U/L (ref 15–41)
Albumin: 4.1 g/dL (ref 3.5–5.0)
Alkaline Phosphatase: 48 U/L (ref 38–126)
BILIRUBIN TOTAL: 0.3 mg/dL (ref 0.3–1.2)
BUN: 28 mg/dL — AB (ref 6–20)
CO2: 26 mmol/L (ref 22–32)
Calcium: 8.9 mg/dL (ref 8.9–10.3)
Chloride: 105 mmol/L (ref 101–111)
Creatinine, Ser: 1.41 mg/dL — ABNORMAL HIGH (ref 0.61–1.24)
GFR, EST NON AFRICAN AMERICAN: 52 mL/min — AB (ref >60)
Glucose, Bld: 119 mg/dL — ABNORMAL HIGH (ref 65–99)
POTASSIUM: 4 mmol/L (ref 3.5–5.1)
Sodium: 139 mmol/L (ref 135–145)
TOTAL PROTEIN: 7.8 g/dL (ref 6.5–8.1)

## 2017-07-13 LAB — CBC
HEMATOCRIT: 36.1 % — AB (ref 39.0–52.0)
HEMOGLOBIN: 11.9 g/dL — AB (ref 13.0–17.0)
MCH: 31.3 pg (ref 26.0–34.0)
MCHC: 33 g/dL (ref 30.0–36.0)
MCV: 95 fL (ref 78.0–100.0)
Platelets: 225 10*3/uL (ref 150–400)
RBC: 3.8 MIL/uL — ABNORMAL LOW (ref 4.22–5.81)
RDW: 14.6 % (ref 11.5–15.5)
WBC: 3.5 10*3/uL — AB (ref 4.0–10.5)

## 2017-07-13 LAB — LIPID PANEL
CHOL/HDL RATIO: 6.5 ratio
CHOLESTEROL: 181 mg/dL (ref 0–200)
HDL: 28 mg/dL — ABNORMAL LOW (ref >40)
LDL CALC: 97 mg/dL (ref 0–99)
TRIGLYCERIDES: 280 mg/dL — AB (ref <150)
VLDL: 56 mg/dL — AB (ref 0–40)

## 2017-07-14 LAB — HEMOGLOBIN A1C
Hgb A1c MFr Bld: 6.3 % — ABNORMAL HIGH (ref 4.8–5.6)
MEAN PLASMA GLUCOSE: 134 mg/dL

## 2017-07-14 LAB — MICROALBUMIN, URINE: Microalb, Ur: 35.5 ug/mL — ABNORMAL HIGH

## 2017-07-20 ENCOUNTER — Ambulatory Visit: Payer: Self-pay | Admitting: Physician Assistant

## 2017-07-20 ENCOUNTER — Encounter: Payer: Self-pay | Admitting: Physician Assistant

## 2017-07-20 VITALS — BP 140/90 | HR 69 | Temp 97.7°F | Ht 71.0 in | Wt 260.0 lb

## 2017-07-20 DIAGNOSIS — R195 Other fecal abnormalities: Secondary | ICD-10-CM

## 2017-07-20 DIAGNOSIS — I10 Essential (primary) hypertension: Secondary | ICD-10-CM

## 2017-07-20 DIAGNOSIS — E119 Type 2 diabetes mellitus without complications: Secondary | ICD-10-CM

## 2017-07-20 DIAGNOSIS — R011 Cardiac murmur, unspecified: Secondary | ICD-10-CM

## 2017-07-20 DIAGNOSIS — Z550 Illiteracy and low-level literacy: Secondary | ICD-10-CM

## 2017-07-20 DIAGNOSIS — E785 Hyperlipidemia, unspecified: Secondary | ICD-10-CM

## 2017-07-20 DIAGNOSIS — Z125 Encounter for screening for malignant neoplasm of prostate: Secondary | ICD-10-CM

## 2017-07-20 DIAGNOSIS — E669 Obesity, unspecified: Secondary | ICD-10-CM

## 2017-07-20 MED ORDER — LOSARTAN POTASSIUM 100 MG PO TABS: 100.0000 mg | ORAL_TABLET | Freq: Every day | ORAL | 3 refills | Status: DC

## 2017-07-20 MED ORDER — LORATADINE 10 MG PO TABS: 10.0000 mg | ORAL_TABLET | Freq: Every day | ORAL | 3 refills | Status: DC | PRN

## 2017-07-20 MED ORDER — ATORVASTATIN CALCIUM 20 MG PO TABS: 20.0000 mg | ORAL_TABLET | Freq: Every day | ORAL | 3 refills | Status: DC

## 2017-07-20 MED ORDER — AMLODIPINE BESYLATE 10 MG PO TABS: 10.0000 mg | ORAL_TABLET | Freq: Every day | ORAL | 3 refills | Status: DC

## 2017-07-20 MED ORDER — FISH OIL 1000 MG PO CAPS: 1.0000 | ORAL_CAPSULE | Freq: Every day | ORAL | 0 refills | Status: DC

## 2017-07-20 MED ORDER — METFORMIN HCL ER 500 MG PO TB24: 500.0000 mg | ORAL_TABLET | Freq: Every day | ORAL | 3 refills | Status: DC

## 2017-07-20 MED ORDER — HYDROCHLOROTHIAZIDE 25 MG PO TABS: 25.0000 mg | ORAL_TABLET | Freq: Every day | ORAL | 3 refills | Status: DC

## 2017-07-20 NOTE — Progress Notes (Signed)
BP 140/90 (BP Location: Right Arm, Patient Position: Sitting, Cuff Size: Large)   Pulse 69   Temp 97.7 F (36.5 C)   Ht 5\' 11"  (1.803 m)   Wt 260 lb (117.9 kg)   SpO2 94%   BMI 36.26 kg/m    Subjective:    Patient ID: Jack Cherry, male    DOB: 07-23-1956, 62 y.o.   MRN: [id number]  HPI: Jack Cherry is a 61 y.o. male presenting on 07/20/2017 for Diabetes and Hypertension   HPI   Pt did not take his meds this morning.   Pt is feeling fine and has no complaints today.   Relevant past medical, surgical, family and social history reviewed and updated as indicated. Interim medical history since our last visit reviewed. Allergies and medications reviewed and updated.   Current Outpatient Prescriptions:  .  amLODipine (NORVASC) 10 MG tablet, Take 1 tablet (10 mg total) by mouth daily. for blood pressure, Disp: 30 tablet, Rfl: 3 .  atenolol (TENORMIN) 100 MG tablet, Take 1 tablet (100 mg total) by mouth daily., Disp: 30 tablet, Rfl: 4 .  atorvastatin (LIPITOR) 10 MG tablet, Take 1 tablet (10 mg total) by mouth daily., Disp: 90 tablet, Rfl: 3 .  hydrochlorothiazide (HYDRODIURIL) 25 MG tablet, Take 1 tablet (25 mg total) by mouth daily., Disp: 30 tablet, Rfl: 3 .  loratadine (CLARITIN) 10 MG tablet, Take 1 tablet (10 mg total) by mouth daily as needed., Disp: 90 tablet, Rfl: 3 .  losartan (COZAAR) 100 MG tablet, Take 1 tablet (100 mg total) by mouth daily., Disp: 30 tablet, Rfl: 3 .  metFORMIN (GLUCOPHAGE XR) 500 MG 24 hr tablet, Take 1 tablet (500 mg total) by mouth daily with breakfast., Disp: 90 tablet, Rfl: 3  Review of Systems  Respiratory: Negative for shortness of breath.   Cardiovascular: Negative for chest pain.  Gastrointestinal: Negative for abdominal pain.    Per HPI unless specifically indicated above     Objective:    BP 140/90 (BP Location: Right Arm, Patient Position: Sitting, Cuff Size: Large)   Pulse 69   Temp 97.7 F (36.5 C)   Ht 5\' 11"  (1.803 m)    Wt 260 lb (117.9 kg)   SpO2 94%   BMI 36.26 kg/m   Wt Readings from Last 3 Encounters:  07/20/17 260 lb (117.9 kg)  03/23/17 256 lb (116.1 kg)  01/19/17 259 lb 4 oz (117.6 kg)    Physical Exam  Constitutional: He is oriented to person, place, and time. He appears well-developed and well-nourished.  HENT:  Head: Normocephalic and atraumatic.  Neck: Neck supple.  Cardiovascular: Normal rate and regular rhythm.   Pulmonary/Chest: Effort normal and breath sounds normal. He has no wheezes.  Abdominal: Soft. Bowel sounds are normal. There is no hepatosplenomegaly. There is no tenderness.  Musculoskeletal: He exhibits no edema.  Lymphadenopathy:    He has no cervical adenopathy.  Neurological: He is alert and oriented to person, place, and time.  Skin: Skin is warm and dry.  Psychiatric: He has a normal mood and affect. His behavior is normal.  Vitals reviewed.   Results for orders placed or performed during the hospital encounter of 07/13/17  Hemoglobin A1c  Result Value Ref Range   Hgb A1c MFr Bld 6.3 (H) 4.8 - 5.6 %   Mean Plasma Glucose 134 mg/dL  Lipid panel  Result Value Ref Range   Cholesterol 181 0 - 200 mg/dL   Triglycerides 280 (H) <150 mg/dL  HDL 28 (L) >40 mg/dL   Total CHOL/HDL Ratio 6.5 RATIO   VLDL 56 (H) 0 - 40 mg/dL   LDL Cholesterol 97 0 - 99 mg/dL  Comprehensive metabolic panel  Result Value Ref Range   Sodium 139 135 - 145 mmol/L   Potassium 4.0 3.5 - 5.1 mmol/L   Chloride 105 101 - 111 mmol/L   CO2 26 22 - 32 mmol/L   Glucose, Bld 119 (H) 65 - 99 mg/dL   BUN 28 (H) 6 - 20 mg/dL   Creatinine, Ser 1.41 (H) 0.61 - 1.24 mg/dL   Calcium 8.9 8.9 - 10.3 mg/dL   Total Protein 7.8 6.5 - 8.1 g/dL   Albumin 4.1 3.5 - 5.0 g/dL   AST 26 15 - 41 U/L   ALT 22 17 - 63 U/L   Alkaline Phosphatase 48 38 - 126 U/L   Total Bilirubin 0.3 0.3 - 1.2 mg/dL   GFR calc non Af Amer 52 (L) >60 mL/min   GFR calc Af Amer >60 >60 mL/min   Anion gap 8 5 - 15  CBC  Result  Value Ref Range   WBC 3.5 (L) 4.0 - 10.5 K/uL   RBC 3.80 (L) 4.22 - 5.81 MIL/uL   Hemoglobin 11.9 (L) 13.0 - 17.0 g/dL   HCT 36.1 (L) 39.0 - 52.0 %   MCV 95.0 78.0 - 100.0 fL   MCH 31.3 26.0 - 34.0 pg   MCHC 33.0 30.0 - 36.0 g/dL   RDW 14.6 11.5 - 15.5 %   Platelets 225 150 - 400 K/uL  Microalbumin, urine  Result Value Ref Range   Microalb, Ur 35.5 (H) Not Estab. ug/mL      Assessment & Plan:   Encounter Diagnoses  Name Primary?  . Type 2 diabetes mellitus without complication, unspecified whether long term insulin use ([place]) Yes  . Essential hypertension, benign   . Hyperlipidemia, unspecified hyperlipidemia type   . 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 Obesity, unspecified classification, unspecified obesity type, unspecified whether serious comorbidity present   . Heart murmur   . Positive fecal occult blood test   . Screening for prostate cancer     -reviewed labs with pt -meds reordered -refer to GI for + iFOBT -pt was given [facility] discount application -refer for annual DM eye exam -monitor elevated Cr -add fish oil 1 daily. Continue lowfat diet. Increase lipitor to 20mg  qd -will reorder echo that was ordered last summer and never done for heart murmur. -pt counseled to take meds in morning before OV -follow up 3 months.  RTO sooner

## 2017-07-22 ENCOUNTER — Encounter: Payer: Self-pay | Admitting: Gastroenterology

## 2017-08-03 ENCOUNTER — Ambulatory Visit: Payer: Self-pay | Admitting: Physician Assistant

## 2017-08-19 ENCOUNTER — Ambulatory Visit (HOSPITAL_COMMUNITY)
Admission: RE | Admit: 2017-08-19 | Discharge: 2017-08-19 | Disposition: A | Discharge status: Home / Self Care | Payer: Self-pay | Source: Ambulatory Visit | Attending: Physician Assistant | Admitting: Physician Assistant

## 2017-08-19 DIAGNOSIS — I119 Hypertensive heart disease without heart failure: Secondary | ICD-10-CM | POA: Insufficient documentation

## 2017-08-19 DIAGNOSIS — Z6836 Body mass index (BMI) 36.0-36.9, adult: Secondary | ICD-10-CM | POA: Insufficient documentation

## 2017-08-19 DIAGNOSIS — R011 Cardiac murmur, unspecified: Secondary | ICD-10-CM | POA: Insufficient documentation

## 2017-08-19 DIAGNOSIS — E119 Type 2 diabetes mellitus without complications: Secondary | ICD-10-CM | POA: Insufficient documentation

## 2017-08-19 DIAGNOSIS — E669 Obesity, unspecified: Secondary | ICD-10-CM | POA: Insufficient documentation

## 2017-08-19 DIAGNOSIS — E785 Hyperlipidemia, unspecified: Secondary | ICD-10-CM | POA: Insufficient documentation

## 2017-08-19 DIAGNOSIS — I350 Nonrheumatic aortic (valve) stenosis: Secondary | ICD-10-CM | POA: Insufficient documentation

## 2017-08-19 LAB — ECHOCARDIOGRAM COMPLETE
AOVTI: 35.2 cm
AV Area VTI index: 0.91 cm2/m2
AV Area VTI: 2.06 cm2
AV Area mean vel: 2.45 cm2
AV Mean grad: 7 mmHg
AV Peak grad: 14 mmHg
AV VEL mean LVOT/AV: 0.64
AV area mean vel ind: 0.99 cm2/m2
AV peak Index: 0.83
AVA: 2.25 cm2
AVPKVEL: 188 cm/s
Ao pk vel: 0.54 m/s
CHL CUP AV VALUE AREA INDEX: 0.91
CHL CUP AV VEL: 2.25
CHL CUP MV DEC (S): 218
CHL CUP STROKE VOLUME: 43 mL
DOP CAL AO MEAN VELOCITY: 120 cm/s
E decel time: 218 msec
E/e' ratio: 9.42
FS: 34 % (ref 28–44)
IVS/LV PW RATIO, ED: 1.12
LA diam end sys: 41 mm
LA diam index: 1.66 cm/m2
LA vol index: 26.1 mL/m2
LA vol: 64.5 mL
LASIZE: 41 mm
LAVOLA4C: 66.6 mL
LV E/e' medial: 9.42
LV PW d: 12.3 mm — AB (ref 0.6–1.1)
LV SIMPSON'S DISK: 59
LV TDI E'MEDIAL: 5
LV dias vol index: 30 mL/m2
LV sys vol: 30 mL
LVDIAVOL: 74 mL (ref 62–150)
LVEEAVG: 9.42
LVELAT: 5.55 cm/s
LVOT SV: 79 mL
LVOT VTI: 20.8 cm
LVOT area: 3.8 cm2
LVOT diameter: 22 mm
LVOT peak VTI: 0.59 cm
LVOT peak grad rest: 4 mmHg
LVOT peak vel: 102 cm/s
LVSYSVOLIN: 12 mL/m2
Lateral S' vel: 11 cm/s
MV pk E vel: 52.3 m/s
MVPKAVEL: 96.4 m/s
RV TAPSE: 18.3 mm
RV sys press: 25 mmHg
Reg peak vel: 236 cm/s
TDI e' lateral: 5.55
TR max vel: 236 cm/s

## 2017-08-19 NOTE — Progress Notes (Signed)
*  PRELIMINARY RESULTS* Echocardiogram 2D Echocardiogram has been performed.  Jack Cherry 08/19/2017, 9:54 AM

## 2017-09-09 ENCOUNTER — Telehealth: Payer: Self-pay | Admitting: Gastroenterology

## 2017-09-09 ENCOUNTER — Ambulatory Visit: Payer: Self-pay | Admitting: Gastroenterology

## 2017-09-09 ENCOUNTER — Encounter: Payer: Self-pay | Admitting: Gastroenterology

## 2017-09-09 NOTE — Telephone Encounter (Signed)
Patient was a no show and letter sent  °

## 2017-10-04 ENCOUNTER — Other Ambulatory Visit: Payer: Self-pay | Admitting: Physician Assistant

## 2017-10-15 ENCOUNTER — Other Ambulatory Visit (HOSPITAL_COMMUNITY)
Admission: RE | Admit: 2017-10-15 | Discharge: 2017-10-15 | Disposition: A | Discharge status: Home / Self Care | Payer: Self-pay | Source: Ambulatory Visit | Attending: Physician Assistant | Admitting: Physician Assistant

## 2017-10-15 DIAGNOSIS — Z125 Encounter for screening for malignant neoplasm of prostate: Secondary | ICD-10-CM | POA: Insufficient documentation

## 2017-10-15 DIAGNOSIS — E119 Type 2 diabetes mellitus without complications: Secondary | ICD-10-CM | POA: Insufficient documentation

## 2017-10-15 DIAGNOSIS — I10 Essential (primary) hypertension: Secondary | ICD-10-CM | POA: Insufficient documentation

## 2017-10-15 DIAGNOSIS — E785 Hyperlipidemia, unspecified: Secondary | ICD-10-CM | POA: Insufficient documentation

## 2017-10-15 LAB — COMPREHENSIVE METABOLIC PANEL
ALK PHOS: 58 U/L (ref 38–126)
ALT: 23 U/L (ref 17–63)
ANION GAP: 6 (ref 5–15)
AST: 23 U/L (ref 15–41)
Albumin: 4.1 g/dL (ref 3.5–5.0)
BILIRUBIN TOTAL: 0.5 mg/dL (ref 0.3–1.2)
BUN: 20 mg/dL (ref 6–20)
CALCIUM: 9.8 mg/dL (ref 8.9–10.3)
CO2: 27 mmol/L (ref 22–32)
Chloride: 106 mmol/L (ref 101–111)
Creatinine, Ser: 1.18 mg/dL (ref 0.61–1.24)
GFR calc Af Amer: >60 mL/min (ref >60)
GFR calc non Af Amer: >60 mL/min (ref >60)
GLUCOSE: 116 mg/dL — AB (ref 65–99)
Potassium: 3.9 mmol/L (ref 3.5–5.1)
SODIUM: 139 mmol/L (ref 135–145)
TOTAL PROTEIN: 7.6 g/dL (ref 6.5–8.1)

## 2017-10-15 LAB — LIPID PANEL
CHOL/HDL RATIO: 3.8 ratio
CHOLESTEROL: 127 mg/dL (ref 0–200)
HDL: 33 mg/dL — AB (ref >40)
LDL Cholesterol: 73 mg/dL (ref 0–99)
TRIGLYCERIDES: 107 mg/dL (ref <150)
VLDL: 21 mg/dL (ref 0–40)

## 2017-10-15 LAB — PSA: PROSTATIC SPECIFIC ANTIGEN: 3.1 ng/mL (ref 0.00–4.00)

## 2017-10-15 LAB — HEMOGLOBIN A1C
Hgb A1c MFr Bld: 6.5 % — ABNORMAL HIGH (ref 4.8–5.6)
Mean Plasma Glucose: 139.85 mg/dL

## 2017-10-19 ENCOUNTER — Ambulatory Visit: Payer: Self-pay | Admitting: Physician Assistant

## 2017-10-19 ENCOUNTER — Encounter: Payer: Self-pay | Admitting: Physician Assistant

## 2017-10-19 VITALS — BP 130/86 | HR 87 | Temp 98.6°F

## 2017-10-19 DIAGNOSIS — E785 Hyperlipidemia, unspecified: Secondary | ICD-10-CM

## 2017-10-19 DIAGNOSIS — I10 Essential (primary) hypertension: Secondary | ICD-10-CM

## 2017-10-19 DIAGNOSIS — E119 Type 2 diabetes mellitus without complications: Secondary | ICD-10-CM

## 2017-10-19 MED ORDER — LORATADINE 10 MG PO TABS
10.0000 mg | ORAL_TABLET | Freq: Every day | ORAL | 3 refills | Status: DC | PRN
Start: 2017-10-19 — End: 2018-11-22

## 2017-10-19 MED ORDER — ATORVASTATIN CALCIUM 20 MG PO TABS: 20.0000 mg | ORAL_TABLET | Freq: Every day | ORAL | 3 refills | Status: DC

## 2017-10-19 MED ORDER — AMLODIPINE BESYLATE 10 MG PO TABS: 10.0000 mg | ORAL_TABLET | Freq: Every day | ORAL | 3 refills | Status: DC

## 2017-10-19 MED ORDER — HYDROCHLOROTHIAZIDE 25 MG PO TABS: 25.0000 mg | ORAL_TABLET | Freq: Every day | ORAL | 3 refills | Status: DC

## 2017-10-19 MED ORDER — LOSARTAN POTASSIUM 100 MG PO TABS: 100.0000 mg | ORAL_TABLET | Freq: Every day | ORAL | 3 refills | Status: DC

## 2017-10-19 MED ORDER — METFORMIN HCL ER 500 MG PO TB24: 500.0000 mg | ORAL_TABLET | Freq: Every day | ORAL | 3 refills | Status: DC

## 2017-10-19 NOTE — Progress Notes (Signed)
BP 130/86   Pulse 87   Temp 98.6 F (37 C)   SpO2 95%    Subjective:    Patient ID: Jack Cherry, male    DOB: 05/05/1956, 61 y.o.   MRN: [id number]  HPI: Jack Cherry is a 61 y.o. male presenting on 10/19/2017 for Diabetes; Hypertension; and Hyperlipidemia   HPI  Pt was a no-show to GI appt in November  He is doing well.  He worked a lot last week in the 15 inch snow.    Relevant past medical, surgical, family and social history reviewed and updated as indicated. Interim medical history since our last visit reviewed. Allergies and medications reviewed and updated.   Current Outpatient Medications:  .  amLODipine (NORVASC) 10 MG tablet, Take 1 tablet (10 mg total) by mouth daily. for blood pressure, Disp: 30 tablet, Rfl: 3 .  atenolol (TENORMIN) 100 MG tablet, TAKE 1 TABLET BY MOUTH ONCE DAILY, Disp: 30 tablet, Rfl: 4 .  atorvastatin (LIPITOR) 20 MG tablet, Take 1 tablet (20 mg total) by mouth daily., Disp: 90 tablet, Rfl: 3 .  hydrochlorothiazide (HYDRODIURIL) 25 MG tablet, Take 1 tablet (25 mg total) by mouth daily., Disp: 30 tablet, Rfl: 3 .  loratadine (CLARITIN) 10 MG tablet, Take 1 tablet (10 mg total) by mouth daily as needed., Disp: 90 tablet, Rfl: 3 .  losartan (COZAAR) 100 MG tablet, Take 1 tablet (100 mg total) by mouth daily., Disp: 30 tablet, Rfl: 3 .  metFORMIN (GLUCOPHAGE XR) 500 MG 24 hr tablet, Take 1 tablet (500 mg total) by mouth daily with breakfast., Disp: 90 tablet, Rfl: 3 .  Omega-3 Fatty Acids (FISH OIL) 1000 MG CAPS, Take 1 capsule (1,000 mg total) by mouth daily. (Patient not taking: Reported on 10/19/2017), Disp: , Rfl: 0   Review of Systems  Constitutional: Negative for appetite change, chills, diaphoresis, fatigue, fever and unexpected weight change.  HENT: Positive for sneezing. Negative for congestion, dental problem, drooling, ear pain, facial swelling, hearing loss, mouth sores, sore throat, trouble swallowing and voice change.   Eyes:  Negative for pain, discharge, redness, itching and visual disturbance.  Respiratory: Positive for cough. Negative for choking, shortness of breath and wheezing.   Cardiovascular: Negative for chest pain, palpitations and leg swelling.  Gastrointestinal: Negative for abdominal pain, blood in stool, constipation, diarrhea and vomiting.  Endocrine: Negative for cold intolerance, heat intolerance and polydipsia.  Genitourinary: Negative for decreased urine volume, dysuria and hematuria.  Musculoskeletal: Positive for back pain. Negative for arthralgias and gait problem.  Skin: Negative for rash.  Allergic/Immunologic: Negative for environmental allergies.  Neurological: Negative for seizures, syncope, light-headedness and headaches.  Hematological: Negative for adenopathy.  Psychiatric/Behavioral: Negative for agitation, dysphoric mood and suicidal ideas. The patient is not nervous/anxious.     Per HPI unless specifically indicated above     Objective:    BP 130/86   Pulse 87   Temp 98.6 F (37 C)   SpO2 95%   Wt Readings from Last 3 Encounters:  07/20/17 260 lb (117.9 kg)  03/23/17 256 lb (116.1 kg)  01/19/17 259 lb 4 oz (117.6 kg)    Physical Exam  Constitutional: He is oriented to person, place, and time. He appears well-developed and well-nourished.  HENT:  Head: Normocephalic and atraumatic.  Neck: Neck supple.  Cardiovascular: Normal rate and regular rhythm.  Pulmonary/Chest: Effort normal and breath sounds normal. He has no wheezes.  Abdominal: Soft. Bowel sounds are normal. There is no hepatosplenomegaly. There  is no tenderness.  Musculoskeletal: He exhibits no edema.  Lymphadenopathy:    He has no cervical adenopathy.  Neurological: He is alert and oriented to person, place, and time.  Skin: Skin is warm and dry.  Psychiatric: He has a normal mood and affect. His behavior is normal.  Vitals reviewed.   Results for orders placed or performed during the hospital  encounter of 10/15/17  Lipid Profile  Result Value Ref Range   Cholesterol 127 0 - 200 mg/dL   Triglycerides 107 <150 mg/dL   HDL 33 (L) >40 mg/dL   Total CHOL/HDL Ratio 3.8 RATIO   VLDL 21 0 - 40 mg/dL   LDL Cholesterol 73 0 - 99 mg/dL  PSA  Result Value Ref Range   Prostatic Specific Antigen 3.10 0.00 - 4.00 ng/mL  HgB A1c  Result Value Ref Range   Hgb A1c MFr Bld 6.5 (H) 4.8 - 5.6 %   Mean Plasma Glucose 139.85 mg/dL  Comprehensive Metabolic Panel (CMET)  Result Value Ref Range   Sodium 139 135 - 145 mmol/L   Potassium 3.9 3.5 - 5.1 mmol/L   Chloride 106 101 - 111 mmol/L   CO2 27 22 - 32 mmol/L   Glucose, Bld 116 (H) 65 - 99 mg/dL   BUN 20 6 - 20 mg/dL   Creatinine, Ser 1.18 0.61 - 1.24 mg/dL   Calcium 9.8 8.9 - 10.3 mg/dL   Total Protein 7.6 6.5 - 8.1 g/dL   Albumin 4.1 3.5 - 5.0 g/dL   AST 23 15 - 41 U/L   ALT 23 17 - 63 U/L   Alkaline Phosphatase 58 38 - 126 U/L   Total Bilirubin 0.5 0.3 - 1.2 mg/dL   GFR calc non Af Amer >60 >60 mL/min   GFR calc Af Amer >60 >60 mL/min   Anion gap 6 5 - 15      Assessment & Plan:   Encounter Diagnoses  Name Primary?  . Type 2 diabetes mellitus without complication, unspecified whether long term insulin use ([place]) Yes  . Essential hypertension, benign   . Hyperlipidemia, unspecified hyperlipidemia type      -pt given phone number to call to r/s GI appt (for + iFOBT) -pt also given number to call to check on his [employer] discount (he is given another application if he needs it) -pt counseled to get back on fish oil -reviewed labs with pt -reviewed echo with pt- grade 1 diastolic dysfunction -will check on pt's diabetic eye exam -pt to follow up 3 months. RTO sooner prn

## 2017-10-19 NOTE — Patient Instructions (Addendum)
[facility] Phone: [phone]  Financial Counselor- [phone] -to check on your [employer] discount

## 2017-12-08 ENCOUNTER — Ambulatory Visit (INDEPENDENT_AMBULATORY_CARE_PROVIDER_SITE_OTHER): Payer: Self-pay | Admitting: Gastroenterology

## 2017-12-08 ENCOUNTER — Other Ambulatory Visit: Payer: Self-pay

## 2017-12-08 ENCOUNTER — Encounter: Payer: Self-pay | Admitting: Gastroenterology

## 2017-12-08 VITALS — BP 140/76 | HR 65 | Temp 97.7°F | Ht 72.0 in | Wt 255.4 lb

## 2017-12-08 DIAGNOSIS — R195 Other fecal abnormalities: Secondary | ICD-10-CM

## 2017-12-08 DIAGNOSIS — D649 Anemia, unspecified: Secondary | ICD-10-CM | POA: Insufficient documentation

## 2017-12-08 NOTE — Patient Instructions (Signed)
1. Please have your labs done to follow up on your anemia. 2. Colonoscopy with possible upper endoscopy as scheduled. See separate instructions.

## 2017-12-08 NOTE — Progress Notes (Signed)
CC'ED TO PCP 

## 2017-12-08 NOTE — Progress Notes (Addendum)
REVIEWED-NO ADDITIONAL RECOMMENDATIONS.  Primary Care Physician:  Soyla Dryer, PA-C  Primary Gastroenterologist:  Barney Drain, MD   Chief Complaint  Patient presents with  . IFOBT    positive    HPI:  Jack Cherry is a 62 y.o. male here at the request of Soyla Dryer, PA-C for further evaluation of heme positive stool. Patient no showed his first visit in 09/2017. He also had normocytic anemia at time of labs in 07/2017 but these have not been repeated. No iron studies available. He is heme positive in 07/2017.   He denies constipation, diarrhea, melena, rectal bleeding.  No abdominal pain.  No heartburn, dysphagia, vomiting, unintentional weight loss.  Denies aspirin products, NSAIDs.  The only over-the-counter medication he takes is fish oil.  Mother had cancer but is not sure what kind.  Current Outpatient Medications  Medication Sig Dispense Refill  . amLODipine (NORVASC) 10 MG tablet Take 1 tablet (10 mg total) by mouth daily. for blood pressure 90 tablet 3  . atenolol (TENORMIN) 100 MG tablet TAKE 1 TABLET BY MOUTH ONCE DAILY 30 tablet 4  . atorvastatin (LIPITOR) 20 MG tablet Take 1 tablet (20 mg total) by mouth daily. 90 tablet 3  . hydrochlorothiazide (HYDRODIURIL) 25 MG tablet Take 1 tablet (25 mg total) by mouth daily. 90 tablet 3  . loratadine (CLARITIN) 10 MG tablet Take 1 tablet (10 mg total) by mouth daily as needed. 90 tablet 3  . losartan (COZAAR) 100 MG tablet Take 1 tablet (100 mg total) by mouth daily. 90 tablet 3  . metFORMIN (GLUCOPHAGE XR) 500 MG 24 hr tablet Take 1 tablet (500 mg total) by mouth daily with breakfast. 90 tablet 3  . Omega-3 Fatty Acids (FISH OIL) 1000 MG CAPS Take 1 capsule (1,000 mg total) by mouth daily.  0   No current facility-administered medications for this visit.     Allergies as of 12/08/2017  . (No Known Allergies)    Past Medical History:  Diagnosis Date  . 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 Hypertension     Past Surgical History:  Procedure Laterality Date  . HERNIA REPAIR      Family History  Problem Relation Age of Onset  . Cancer Mother        not sure what kind    Social History   Socioeconomic History  . Marital status: Single    Spouse name: Not on file  . Number of children: Not on file  . Years of education: Not on file  . Highest education level: Not on file  Social Needs  . Financial resource strain: Not on file  . Food insecurity - worry: Not on file  . Food insecurity - inability: Not on file  . Transportation needs - medical: Not on file  . Transportation needs - non-medical: Not on file  Occupational History  . Not on file  Tobacco Use  . Smoking status: Never Smoker  . Smokeless tobacco: Never Used  Substance and Sexual Activity  . Alcohol use: No  . Drug use: Yes    Types: Marijuana  . Sexual activity: Not on file  Other Topics Concern  . Not on file  Social History Narrative  . Not on file      ROS:  General: Negative for anorexia, weight loss, fever, chills, fatigue, weakness. Eyes: Negative for vision changes.  ENT: Negative for hoarseness, difficulty swallowing , nasal congestion. CV:  Negative for chest pain, angina, palpitations, dyspnea on exertion, peripheral edema.  Respiratory: Negative for dyspnea at rest, dyspnea on exertion, cough, sputum, wheezing.  GI: See history of present illness. GU:  Negative for dysuria, hematuria, urinary incontinence, urinary frequency, nocturnal urination.  MS: Negative for joint pain, low back pain.  Derm: Negative for rash or itching.  Neuro: Negative for weakness, abnormal sensation, seizure, frequent headaches, memory loss, confusion.  Psych: Negative for anxiety, depression, suicidal ideation, hallucinations.  Endo: Negative for unusual weight change.  Heme: Negative for bruising or bleeding. Allergy: Negative for rash or hives.    Physical  Examination:  BP 140/76   Pulse 65   Temp 97.7 F (36.5 C) (Oral)   Ht 6' (1.829 m)   Wt 255 lb 6.4 oz (115.8 kg)   BMI 34.64 kg/m    General: Well-nourished, well-developed in no acute distress.  Head: Normocephalic, atraumatic.   Eyes: Conjunctiva pink, no icterus. Mouth: Oropharyngeal mucosa moist and pink , no lesions erythema or exudate. Neck: Supple without thyromegaly, masses, or lymphadenopathy.  Lungs: Clear to auscultation bilaterally.  Heart: Regular rate and rhythm, no murmurs rubs or gallops.  Abdomen: Bowel sounds are normal, nontender, nondistended, no hepatosplenomegaly or masses, no abdominal bruits or    hernia , no rebound or guarding.   Rectal: Deferred Extremities: No lower extremity edema. No clubbing or deformities.  Neuro: Alert and oriented x 4 , grossly normal neurologically.  Skin: Warm and dry, no rash or jaundice.   Psych: Alert and cooperative, normal mood and affect.  Labs: Lab Results  Component Value Date   HGBA1C 6.5 (H) 10/15/2017   Lab Results  Component Value Date   CREATININE 1.18 10/15/2017   BUN 20 10/15/2017   NA 139 10/15/2017   K 3.9 10/15/2017   CL 106 10/15/2017   CO2 27 10/15/2017   Lab Results  Component Value Date   ALT 23 10/15/2017   AST 23 10/15/2017   ALKPHOS 58 10/15/2017   BILITOT 0.5 10/15/2017   Lab Results  Component Value Date   WBC 3.5 (L) 07/13/2017   HGB 11.9 (L) 07/13/2017   HCT 36.1 (L) 07/13/2017   MCV 95.0 07/13/2017   PLT 225 07/13/2017   No results found for: IRON, TIBC, FERRITIN  Heme positive in 07/2017.  Imaging Studies: No results found.

## 2017-12-08 NOTE — Assessment & Plan Note (Addendum)
62 year old gentleman with history of Hemoccult positive stool, normocytic anemia as outlined above.  No prior colonoscopy.  Denies any GI symptoms.  Discussed role of colonoscopy with possible upper endoscopy in this scenario given anemia/heme + stool.  I have discussed the risks, alternatives, benefits with regards to but not limited to the risk of reaction to medication, bleeding, infection, perforation and the patient is agreeable to proceed. Written consent to be obtained.  Update CBC, iron/tibc/ferritin.   Patient is unable to read. Went over diet/bowel prep at length. He reports that he lives with a family member who reads and can help him follow prep.

## 2018-01-08 ENCOUNTER — Ambulatory Visit (HOSPITAL_COMMUNITY)
Admission: RE | Admit: 2018-01-08 | Discharge: 2018-01-08 | Disposition: A | Discharge status: Home / Self Care | Payer: Self-pay | Source: Ambulatory Visit | Attending: Gastroenterology | Admitting: Gastroenterology

## 2018-01-08 ENCOUNTER — Encounter (HOSPITAL_COMMUNITY)
Admission: RE | Disposition: A | Discharge status: Home / Self Care | Payer: Self-pay | Source: Ambulatory Visit | Attending: Gastroenterology

## 2018-01-08 ENCOUNTER — Other Ambulatory Visit: Payer: Self-pay

## 2018-01-08 ENCOUNTER — Encounter (HOSPITAL_COMMUNITY): Payer: Self-pay | Admitting: Gastroenterology

## 2018-01-08 ENCOUNTER — Other Ambulatory Visit (HOSPITAL_COMMUNITY)
Admission: RE | Admit: 2018-01-08 | Discharge: 2018-01-08 | Disposition: A | Discharge status: Home / Self Care | Payer: Self-pay | Source: Ambulatory Visit | Attending: Physician Assistant | Admitting: Physician Assistant

## 2018-01-08 DIAGNOSIS — D128 Benign neoplasm of rectum: Secondary | ICD-10-CM

## 2018-01-08 DIAGNOSIS — I10 Essential (primary) hypertension: Secondary | ICD-10-CM | POA: Insufficient documentation

## 2018-01-08 DIAGNOSIS — D649 Anemia, unspecified: Secondary | ICD-10-CM

## 2018-01-08 DIAGNOSIS — K2970 Gastritis, unspecified, without bleeding: Secondary | ICD-10-CM

## 2018-01-08 DIAGNOSIS — K2950 Unspecified chronic gastritis without bleeding: Secondary | ICD-10-CM | POA: Insufficient documentation

## 2018-01-08 DIAGNOSIS — B9681 Helicobacter pylori [H. pylori] as the cause of diseases classified elsewhere: Secondary | ICD-10-CM | POA: Insufficient documentation

## 2018-01-08 DIAGNOSIS — D122 Benign neoplasm of ascending colon: Secondary | ICD-10-CM

## 2018-01-08 DIAGNOSIS — E119 Type 2 diabetes mellitus without complications: Secondary | ICD-10-CM

## 2018-01-08 DIAGNOSIS — Z7984 Long term (current) use of oral hypoglycemic drugs: Secondary | ICD-10-CM | POA: Insufficient documentation

## 2018-01-08 DIAGNOSIS — R195 Other fecal abnormalities: Secondary | ICD-10-CM

## 2018-01-08 DIAGNOSIS — Z79899 Other long term (current) drug therapy: Secondary | ICD-10-CM | POA: Insufficient documentation

## 2018-01-08 DIAGNOSIS — K648 Other hemorrhoids: Secondary | ICD-10-CM | POA: Insufficient documentation

## 2018-01-08 DIAGNOSIS — K644 Residual hemorrhoidal skin tags: Secondary | ICD-10-CM | POA: Insufficient documentation

## 2018-01-08 DIAGNOSIS — K2980 Duodenitis without bleeding: Secondary | ICD-10-CM

## 2018-01-08 DIAGNOSIS — D125 Benign neoplasm of sigmoid colon: Secondary | ICD-10-CM

## 2018-01-08 DIAGNOSIS — E785 Hyperlipidemia, unspecified: Secondary | ICD-10-CM

## 2018-01-08 HISTORY — PX: COLONOSCOPY: SHX5424

## 2018-01-08 HISTORY — DX: Pure hypercholesterolemia, unspecified: E78.00

## 2018-01-08 HISTORY — PX: ESOPHAGOGASTRODUODENOSCOPY: SHX5428

## 2018-01-08 LAB — LIPID PANEL
Cholesterol: 135 mg/dL (ref 0–200)
HDL: 32 mg/dL — AB (ref >40)
LDL Cholesterol: 69 mg/dL (ref 0–99)
TRIGLYCERIDES: 171 mg/dL — AB (ref <150)
Total CHOL/HDL Ratio: 4.2 RATIO
VLDL: 34 mg/dL (ref 0–40)

## 2018-01-08 LAB — COMPREHENSIVE METABOLIC PANEL
ALBUMIN: 4.1 g/dL (ref 3.5–5.0)
ALT: 15 U/L — ABNORMAL LOW (ref 17–63)
AST: 22 U/L (ref 15–41)
Alkaline Phosphatase: 51 U/L (ref 38–126)
Anion gap: 12 (ref 5–15)
BUN: 16 mg/dL (ref 6–20)
CHLORIDE: 100 mmol/L — AB (ref 101–111)
CO2: 28 mmol/L (ref 22–32)
Calcium: 9.2 mg/dL (ref 8.9–10.3)
Creatinine, Ser: 1.1 mg/dL (ref 0.61–1.24)
GFR calc Af Amer: >60 mL/min (ref >60)
Glucose, Bld: 109 mg/dL — ABNORMAL HIGH (ref 65–99)
POTASSIUM: 3.7 mmol/L (ref 3.5–5.1)
SODIUM: 140 mmol/L (ref 135–145)
TOTAL PROTEIN: 7.6 g/dL (ref 6.5–8.1)
Total Bilirubin: 0.8 mg/dL (ref 0.3–1.2)

## 2018-01-08 LAB — GLUCOSE, CAPILLARY: Glucose-Capillary: 88 mg/dL (ref 65–99)

## 2018-01-08 SURGERY — COLONOSCOPY
Anesthesia: Moderate Sedation

## 2018-01-08 MED ORDER — MEPERIDINE HCL 100 MG/ML IJ SOLN
INTRAMUSCULAR | Status: DC | PRN
  Administered 2018-01-08: 25 mg via INTRAVENOUS
  Administered 2018-01-08: 50 mg via INTRAVENOUS
  Administered 2018-01-08: 25 mg via INTRAVENOUS

## 2018-01-08 MED ORDER — METOPROLOL TARTRATE 5 MG/5ML IV SOLN
INTRAVENOUS | Status: AC
  Filled 2018-01-08: qty 5

## 2018-01-08 MED ORDER — METOPROLOL TARTRATE 5 MG/5ML IV SOLN
2.5000 mg | Freq: Once | INTRAVENOUS | Status: AC
  Administered 2018-01-08: 2.5 mg via INTRAVENOUS

## 2018-01-08 MED ORDER — SODIUM CHLORIDE 0.9% FLUSH
INTRAVENOUS | Status: AC
  Filled 2018-01-08: qty 10

## 2018-01-08 MED ORDER — PROMETHAZINE HCL 25 MG/ML IJ SOLN
INTRAMUSCULAR | Status: AC
  Filled 2018-01-08: qty 1

## 2018-01-08 MED ORDER — SODIUM CHLORIDE 0.9 % IV SOLN
INTRAVENOUS | Status: DC
  Administered 2018-01-08: 13:00:00 via INTRAVENOUS

## 2018-01-08 MED ORDER — MIDAZOLAM HCL 5 MG/5ML IJ SOLN
INTRAMUSCULAR | Status: DC | PRN
  Administered 2018-01-08: 1 mg via INTRAVENOUS
  Administered 2018-01-08: 2 mg via INTRAVENOUS
  Administered 2018-01-08: 1 mg via INTRAVENOUS
  Administered 2018-01-08: 2 mg via INTRAVENOUS

## 2018-01-08 MED ORDER — PROMETHAZINE HCL 25 MG/ML IJ SOLN
12.5000 mg | Freq: Once | INTRAMUSCULAR | Status: AC
  Administered 2018-01-08: 12.5 mg via INTRAVENOUS

## 2018-01-08 MED ORDER — LIDOCAINE VISCOUS 2 % MT SOLN
OROMUCOSAL | Status: AC
  Filled 2018-01-08: qty 15

## 2018-01-08 MED ORDER — LIDOCAINE VISCOUS 2 % MT SOLN
OROMUCOSAL | Status: DC | PRN
  Administered 2018-01-08: 1 via OROMUCOSAL

## 2018-01-08 MED ORDER — MEPERIDINE HCL 100 MG/ML IJ SOLN
INTRAMUSCULAR | Status: AC
  Filled 2018-01-08: qty 2

## 2018-01-08 MED ORDER — MIDAZOLAM HCL 5 MG/5ML IJ SOLN
INTRAMUSCULAR | Status: AC
  Filled 2018-01-08: qty 10

## 2018-01-08 NOTE — Progress Notes (Signed)
Patient in preop for colonoscopy/EGD. When hooked to monitor patient showed sinus arrythmia rate of 72-105 then into afib. Patient stated he has not taken any of his BP medications since Wednesday including atenolol. Discussed with Dr. Oneida Alar. 2.5 mg IV Lopressor given as per physician verbal order. Will continue to monitor.

## 2018-01-08 NOTE — Op Note (Signed)
[facility] Patient Name: Jack Cherry Procedure Date: 01/08/2018 2:03 PM MRN: [id number] Date of Birth: 07-22-1956 Attending MD: Barney Drain MD, MD CSN: [id number] Age: 61 Admit Type: Outpatient Procedure:                Upper GI endoscopy WITH COLD FORCEPS BIOPSY Indications:              Heme positive stool Providers:                Barney Drain MD, MD, Charlsie Quest. Theda Sers RN, RN,                            Nelma Rothman, Technician Referring MD:              Medicines:                TCS + Midazolam 1 mg IV Complications:            No immediate complications. Estimated Blood Loss:     Estimated blood loss was minimal. Procedure:                Pre-Anesthesia Assessment:                           - Prior to the procedure, a History and Physical                            was performed, and patient medications and                            allergies were reviewed. The patient's tolerance of                            previous anesthesia was also reviewed. The risks                            and benefits of the procedure and the sedation                            options and risks were discussed with the patient.                            All questions were answered, and informed consent                            was obtained. Prior Anticoagulants: The patient has                            taken no previous anticoagulant or antiplatelet                            agents. ASA Grade Assessment: II - A patient with                            mild systemic disease. After reviewing the risks  and benefits, the patient was deemed in                            satisfactory condition to undergo the procedure.                            After obtaining informed consent, the endoscope was                            passed under direct vision. Throughout the                            procedure, the patient's blood pressure, pulse, and     oxygen saturations were monitored continuously. The                            [id number] ([id number]) scope was introduced through the                            mouth, and advanced to the second part of duodenum.                            The upper GI endoscopy was accomplished without                            difficulty. The patient tolerated the procedure                            well. Scope In: 2:09:15 PM Scope Out: 2:14:33 PM Total Procedure Duration: 0 hours 5 minutes 18 seconds  Findings:      The examined esophagus was normal.      Patchy mild inflammation characterized by congestion (edema) and       erythema was found on the greater curvature of the stomach and in the       gastric antrum. Biopsies were taken with a cold forceps for Helicobacter       pylori testing.      Patchy mild inflammation characterized by congestion (edema) and       erythema was found in the duodenal bulb.      The second portion of the duodenum was normal. Impression:               - Normal esophagus.                           - MILD Gastritis/Duodenitis.                           - HEME POSITIVE STOOL DUE TO                            POLYPS/GASTRITIS/DUODENITIS Moderate Sedation:      Moderate (conscious) sedation was administered by the endoscopy nurse       and supervised by the endoscopist. The following parameters were       monitored: oxygen saturation, heart rate, blood pressure, and response  to care. Total physician intraservice time was 14 minutes. Recommendation:           - High fiber diet and low fat diet.                           - Continue present medications.                           - Await pathology results.                           - Return to my office in 4 months.                           - Patient has a contact number available for                            emergencies. The signs and symptoms of potential                            delayed complications were  discussed with the                            patient. Return to normal activities tomorrow.                            Written discharge instructions were provided to the                            patient. Procedure Code(s):        --- Professional ---                           [phone], Esophagogastroduodenoscopy, flexible,                            transoral; with biopsy, single or multiple                           99152, Moderate sedation services provided by the                            same physician or other qualified health care                            professional performing the diagnostic or                            therapeutic service that the sedation supports,                            requiring the presence of an independent trained                            observer to assist in the monitoring of the  patient's level of consciousness and physiological                            status; initial 15 minutes of intraservice time,                            patient age 40 years or older Diagnosis Code(s):        --- Professional ---                           K29.70, Gastritis, unspecified, without bleeding                           K29.80, Duodenitis without bleeding                           R19.5, Other fecal abnormalities CPT copyright 2016 [employer]. All rights reserved. The codes documented in this report are preliminary and upon coder review may  be revised to meet current compliance requirements. Barney Drain, MD Barney Drain MD, MD 01/08/2018 2:18:45 PM This report has been signed electronically. Number of Addenda: 0

## 2018-01-08 NOTE — Discharge Instructions (Signed)
You have large EXTERNAL AND MODERATE internal hemorrhoids, and HAD 3 polyps removed. You have gastritis. I biopsied your stomach.   DRINK WATER TO KEEP YOUR URINE LIGHT YELLOW.  FOLLOW A HIGH FIBER/LOW FAT DIET. AVOID ITEMS THAT CAUSE BLOATING. SEE INFO BELOW.  YOUR BIOPSY RESULTS WILL BE AVAILABLE IN 7 DAYS WITH YOUR RESULTS.   FOLLOW UP IN 4 MOS.   Next colonoscopy in 3-5 years.   ENDOSCOPY Care After Read the instructions outlined below and refer to this sheet in the next week. These discharge instructions provide you with general information on caring for yourself after you leave the hospital. While your treatment has been planned according to the most current medical practices available, unavoidable complications occasionally occur. If you have any problems or questions after discharge, call DR. Rakiya Krawczyk, [phone].  ACTIVITY  You may resume your regular activity, but move at a slower pace for the next 24 hours.   Take frequent rest periods for the next 24 hours.   Walking will help get rid of the air and reduce the bloated feeling in your belly (abdomen).   No driving for 24 hours (because of the medicine (anesthesia) used during the test).   You may shower.   Do not sign any important legal documents or operate any machinery for 24 hours (because of the anesthesia used during the test).    NUTRITION  Drink plenty of fluids.   You may resume your normal diet as instructed by your doctor.   Begin with a light meal and progress to your normal diet. Heavy or fried foods are harder to digest and may make you feel sick to your stomach (nauseated).   Avoid alcoholic beverages for 24 hours or as instructed.    MEDICATIONS  You may resume your normal medications.   WHAT YOU CAN EXPECT TODAY  Some feelings of bloating in the abdomen.   Passage of more gas than usual.   Spotting of blood in your stool or on the toilet paper  .  IF YOU HAD POLYPS REMOVED DURING  THE ENDOSCOPY:  Eat a soft diet IF YOU HAVE NAUSEA, BLOATING, ABDOMINAL PAIN, OR VOMITING.    FINDING OUT THE RESULTS OF YOUR TEST Not all test results are available during your visit. DR. Oneida Alar WILL CALL YOU WITHIN 14 DAYS OF YOUR PROCEDUE WITH YOUR RESULTS. Do not assume everything is normal if you have not heard from DR. Denell Cothern, CALL HER OFFICE AT [phone].  SEEK IMMEDIATE MEDICAL ATTENTION AND CALL THE OFFICE: [phone] IF:  You have more than a spotting of blood in your stool.   Your belly is swollen (abdominal distention).   You are nauseated or vomiting.   You have a temperature over 101F.   You have abdominal pain or discomfort that is severe or gets worse throughout the day.  Polyps, Colon  A polyp is extra tissue that grows inside your body. Colon polyps grow in the large intestine. The large intestine, also called the colon, is part of your digestive system. It is a long, hollow tube at the end of your digestive tract where your body makes and stores stool. Most polyps are not dangerous. They are benign. This means they are not cancerous. But over time, some types of polyps can turn into cancer. Polyps that are smaller than a pea are usually not harmful. But larger polyps could someday become or may already be cancerous. To be safe, doctors remove all polyps and test them.  PREVENTION There is not one sure way to prevent polyps. You might be able to lower your risk of getting them if you:  Eat more fruits and vegetables and less fatty food.   Do not smoke.   Avoid alcohol.   Exercise every day.   Lose weight if you are overweight.   Eating more calcium and folate can also lower your risk of getting polyps. Some foods that are rich in calcium are milk, cheese, and broccoli. Some foods that are rich in folate are chickpeas, kidney beans, and spinach.    Gastritis  Gastritis is an inflammation (the body's way of reacting to injury and/or infection) of the  stomach. It is often caused by viral or bacterial (germ) infections. It can also be caused BY ASPIRIN, BC/GOODY POWDER'S, (IBUPROFEN) MOTRIN, OR ALEVE (NAPROXEN), chemicals (including alcohol), SPICY FOODS, and medications. This illness may be associated with generalized malaise (feeling tired, not well), UPPER ABDOMINAL STOMACH cramps, and fever. One common bacterial cause of gastritis is an organism known as H. Pylori. This can be treated with antibiotics.    High-Fiber Diet A high-fiber diet changes your normal diet to include more whole grains, legumes, fruits, and vegetables. Changes in the diet involve replacing refined carbohydrates with unrefined foods. The calorie level of the diet is essentially unchanged. The Dietary Reference Intake (recommended amount) for adult males is 38 grams per day. For adult females, it is 25 grams per day. Pregnant and lactating women should consume 28 grams of fiber per day. Fiber is the intact part of a plant that is not broken down during digestion. Functional fiber is fiber that has been isolated from the plant to provide a beneficial effect in the body. PURPOSE  Increase stool bulk.   Ease and regulate bowel movements.   Lower cholesterol.   REDUCE RISK OF COLON CANCER  INDICATIONS THAT YOU NEED MORE FIBER  Constipation and hemorrhoids.   Uncomplicated diverticulosis (intestine condition) and irritable bowel syndrome.   Weight management.   As a protective measure against hardening of the arteries (atherosclerosis), diabetes, and cancer.   GUIDELINES FOR INCREASING FIBER IN THE DIET  Start adding fiber to the diet slowly. A gradual increase of about 5 more grams (2 slices of whole-wheat bread, 2 servings of most fruits or vegetables, or 1 bowl of high-fiber cereal) per day is best. Too rapid an increase in fiber may result in constipation, flatulence, and bloating.   Drink enough water and fluids to keep your urine clear or pale yellow. Water,  juice, or caffeine-free drinks are recommended. Not drinking enough fluid may cause constipation.   Eat a variety of high-fiber foods rather than one type of fiber.   Try to increase your intake of fiber through using high-fiber foods rather than fiber pills or supplements that contain small amounts of fiber.   The goal is to change the types of food eaten. Do not supplement your present diet with high-fiber foods, but replace foods in your present diet.   INCLUDE A VARIETY OF FIBER SOURCES  Replace refined and processed grains with whole grains, canned fruits with fresh fruits, and incorporate other fiber sources. White rice, white breads, and most bakery goods contain little or no fiber.   Brown whole-grain rice, buckwheat oats, and many fruits and vegetables are all good sources of fiber. These include: broccoli, Brussels sprouts, cabbage, cauliflower, beets, sweet potatoes, white potatoes (skin on), carrots, tomatoes, eggplant, squash, berries, fresh fruits, and dried fruits.   Cereals  appear to be the richest source of fiber. Cereal fiber is found in whole grains and bran. Bran is the fiber-rich outer coat of cereal grain, which is largely removed in refining. In whole-grain cereals, the bran remains. In breakfast cereals, the largest amount of fiber is found in those with "bran" in their names. The fiber content is sometimes indicated on the label.   You may need to include additional fruits and vegetables each day.   In baking, for 1 cup white flour, you may use the following substitutions:   1 cup whole-wheat flour minus 2 tablespoons.   1/2 cup white flour plus 1/2 cup whole-wheat flour.   Low-Fat Diet BREADS, CEREALS, PASTA, RICE, DRIED PEAS, AND BEANS These products are high in carbohydrates and most are low in fat. Therefore, they can be increased in the diet as substitutes for fatty foods. They too, however, contain calories and should not be eaten in excess. Cereals can be  eaten for snacks as well as for breakfast.  Include foods that contain fiber (fruits, vegetables, whole grains, and legumes). Research shows that fiber may lower blood cholesterol levels, especially the water-soluble fiber found in fruits, vegetables, oat products, and legumes. FRUITS AND VEGETABLES It is good to eat fruits and vegetables. Besides being sources of fiber, both are rich in vitamins and some minerals. They help you get the daily allowances of these nutrients. Fruits and vegetables can be used for snacks and desserts. MEATS Limit lean meat, chicken, [place], and fish to no more than 6 ounces per day. Beef, Pork, and Lamb Use lean cuts of beef, pork, and lamb. Lean cuts include:  Extra-lean ground beef.  Arm roast.  Sirloin tip.  Center-cut ham.  Round steak.  Loin chops.  Rump roast.  Tenderloin.  Trim all fat off the outside of meats before cooking. It is not necessary to severely decrease the intake of red meat, but lean choices should be made. Lean meat is rich in protein and contains a highly absorbable form of iron. Premenopausal women, in particular, should avoid reducing lean red meat because this could increase the risk for low red blood cells (iron-deficiency anemia). The organ meats, such as liver, sweetbreads, kidneys, and brain are very rich in cholesterol. They should be limited. Chicken and [place] These are good sources of protein. The fat of poultry can be reduced by removing the skin and underlying fat layers before cooking. Chicken and [place] can be substituted for lean red meat in the diet. Poultry should not be fried or covered with high-fat sauces. Fish and Shellfish Fish is a good source of protein. Shellfish contain cholesterol, but they usually are low in saturated fatty acids. The preparation of fish is important. Like chicken and [place], they should not be fried or covered with high-fat sauces. EGGS Egg whites contain no fat or cholesterol. They can be  eaten often. Try 1 to 2 egg whites instead of whole eggs in recipes or use egg substitutes that do not contain yolk. MILK AND DAIRY PRODUCTS Use skim or 1% milk instead of 2% or whole milk. Decrease whole milk, natural, and processed cheeses. Use nonfat or low-fat (2%) cottage cheese or low-fat cheeses made from vegetable oils. Choose nonfat or low-fat (1 to 2%) yogurt. Experiment with evaporated skim milk in recipes that call for heavy cream. Substitute low-fat yogurt or low-fat cottage cheese for sour cream in dips and salad dressings. Have at least 2 servings of low-fat dairy products, such as 2 glasses  of skim (or 1%) milk each day to help get your daily calcium intake.  FATS AND OILS Reduce the total intake of fats, especially saturated fat. Butterfat, lard, and beef fats are high in saturated fat and cholesterol. These should be avoided as much as possible. Vegetable fats do not contain cholesterol, but certain vegetable fats, such as coconut oil, palm oil, and palm kernel oil are very high in saturated fats. These should be limited. These fats are often used in bakery goods, processed foods, popcorn, oils, and nondairy creamers. Vegetable shortenings and some peanut butters contain hydrogenated oils, which are also saturated fats. Read the labels on these foods and check for saturated vegetable oils. Unsaturated vegetable oils and fats do not raise blood cholesterol. However, they should be limited because they are fats and are high in calories. Total fat should still be limited to 30% of your daily caloric intake. Desirable liquid vegetable oils are corn oil, cottonseed oil, olive oil, canola oil, safflower oil, soybean oil, and sunflower oil. Peanut oil is not as good, but small amounts are acceptable. Buy a heart-healthy tub margarine that has no partially hydrogenated oils in the ingredients. Mayonnaise and salad dressings often are made from unsaturated fats, but they should also be limited because  of their high calorie and fat content. Seeds, nuts, peanut butter, olives, and avocados are high in fat, but the fat is mainly the unsaturated type. These foods should be limited mainly to avoid excess calories and fat. OTHER EATING TIPS Snacks  Most sweets should be limited as snacks. They tend to be rich in calories and fats, and their caloric content outweighs their nutritional value. Some good choices in snacks are graham crackers, melba toast, soda crackers, bagels (no egg), English muffins, fruits, and vegetables. These snacks are preferable to snack crackers, [place] fries, and chips. Popcorn should be air-popped or cooked in small amounts of liquid vegetable oil. Desserts Eat fruit, low-fat yogurt, and fruit ices. AVOID pastries, cake, and cookies. Sherbet, angel food cake, gelatin dessert, frozen low-fat yogurt, or other frozen products that do not contain saturated fat (pure fruit juice bars, frozen ice pops) are also acceptable.  COOKING METHODS Choose those methods that use little or no fat. They include: Poaching.  Braising.  Steaming.  Grilling.  Baking.  Stir-frying.  Broiling.  Microwaving.  Foods can be cooked in a nonstick pan without added fat, or use a nonfat cooking spray in regular cookware. Limit fried foods and avoid frying in saturated fat. Add moisture to lean meats by using water, broth, cooking wines, and other nonfat or low-fat sauces along with the cooking methods mentioned above. Soups and stews should be chilled after cooking. The fat that forms on top after a few hours in the refrigerator should be skimmed off. When preparing meals, avoid using excess salt. Salt can contribute to raising blood pressure in some people. EATING AWAY FROM HOME Order entres, potatoes, and vegetables without sauces or butter. When meat exceeds the size of a deck of cards (3 to 4 ounces), the rest can be taken home for another meal. Choose vegetable or fruit salads and ask for  low-calorie salad dressings to be served on the side. Use dressings sparingly. Limit high-fat toppings, such as bacon, crumbled eggs, cheese, sunflower seeds, and olives. Ask for heart-healthy tub margarine instead of butter.

## 2018-01-08 NOTE — H&P (Addendum)
  Primary Care Physician:  Soyla Dryer, PA-C Primary Gastroenterologist:  Dr. Oneida Alar  Pre-Procedure History & Physical: HPI:  Jack Cherry is a 62 y.o. male here for HEME POS STOOLS.  Past Medical History:  Diagnosis Date  . 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 Diabetes ([place])   . Hypertension     Past Surgical History:  Procedure Laterality Date  . HERNIA REPAIR      Prior to Admission medications   Medication Sig Start Date End Date Taking? Authorizing Provider  amLODipine (NORVASC) 10 MG tablet Take 1 tablet (10 mg total) by mouth daily. for blood pressure 10/19/17   Soyla Dryer, PA-C  atenolol (TENORMIN) 100 MG tablet TAKE 1 TABLET BY MOUTH ONCE DAILY 10/05/17   Soyla Dryer, PA-C  atorvastatin (LIPITOR) 20 MG tablet Take 1 tablet (20 mg total) by mouth daily. 10/19/17   Soyla Dryer, PA-C  hydrochlorothiazide (HYDRODIURIL) 25 MG tablet Take 1 tablet (25 mg total) by mouth daily. 10/19/17   Soyla Dryer, PA-C  loratadine (CLARITIN) 10 MG tablet Take 1 tablet (10 mg total) by mouth daily as needed. 10/19/17   Soyla Dryer, PA-C  losartan (COZAAR) 100 MG tablet Take 1 tablet (100 mg total) by mouth daily. 10/19/17   Soyla Dryer, PA-C  metFORMIN (GLUCOPHAGE XR) 500 MG 24 hr tablet Take 1 tablet (500 mg total) by mouth daily with breakfast. 10/19/17   Soyla Dryer, PA-C  Omega-3 Fatty Acids (FISH OIL) 1000 MG CAPS Take 1 capsule (1,000 mg total) by mouth daily. 07/20/17   Soyla Dryer, PA-C    Allergies as of 12/08/2017  . (No Known Allergies)    Family History  Problem Relation Age of Onset  . Cancer Mother        not sure what kind    Social History   Socioeconomic History  . Marital status: Single    Spouse name: Not on file  . Number of children: Not on file  . Years of education: Not on file  . Highest education level: Not on file  Social Needs  . Financial resource strain: Not  on file  . Food insecurity - worry: Not on file  . Food insecurity - inability: Not on file  . Transportation needs - medical: Not on file  . Transportation needs - non-medical: Not on file  Occupational History  . Not on file  Tobacco Use  . Smoking status: Never Smoker  . Smokeless tobacco: Never Used  Substance and Sexual Activity  . Alcohol use: No  . Drug use: Yes    Types: Marijuana  . Sexual activity: Not on file  Other Topics Concern  . Not on file  Social History Narrative  . Not on file    Review of Systems: See HPI, otherwise negative ROS   Physical Exam: There were no vitals taken for this visit. General:   Alert,  pleasant and cooperative in NAD Head:  Normocephalic and atraumatic. Neck:  Supple; Lungs:  Clear throughout to auscultation.    Heart:  Regular rate and irregular rhythm. Abdomen:  Soft, nontender and nondistended. Normal bowel sounds, without guarding, and without rebound.   Neurologic:  Alert and  oriented x4;  grossly normal neurologically.  Impression/Plan:     HEME POS STOOLS  PLAN:  1.TCS/POSSIBLE EGD TODAY DISCUSSED PROCEDURE, BENEFITS, & RISKS: < 1% chance of medication reaction, bleeding, perforation, or rupture of spleen/liver.

## 2018-01-08 NOTE — Op Note (Signed)
[facility] Patient Name: Jack Cherry Procedure Date: 01/08/2018 1:12 PM MRN: [id number] Date of Birth: [birth year] Attending MD: Barney Drain MD, MD CSN: [id number] Age: 62 Admit Type: Outpatient Procedure:                Colonoscopy WITH SNARE CAUTERY POLYPECTOMY Indications:              Heme positive stool Providers:                Barney Drain MD, MD, Charlsie Quest. Theda Sers RN, RN,                            Nelma Rothman, Technician Referring MD:              Medicines:                Promethazine 12.5 mg IV, Meperidine 100 mg IV,                            Midazolam 5 mg IV Complications:            No immediate complications. Estimated Blood Loss:     Estimated blood loss: none. Procedure:                Pre-Anesthesia Assessment:                           - Prior to the procedure, a History and Physical                            was performed, and patient medications and                            allergies were reviewed. The patient's tolerance of                            previous anesthesia was also reviewed. The risks                            and benefits of the procedure and the sedation                            options and risks were discussed with the patient.                            All questions were answered, and informed consent                            was obtained. Prior Anticoagulants: The patient has                            taken no previous anticoagulant or antiplatelet                            agents. ASA Grade Assessment: II - A patient with  mild systemic disease. After reviewing the risks                            and benefits, the patient was deemed in                            satisfactory condition to undergo the procedure.                            After obtaining informed consent, the colonoscope                            was passed under direct vision. Throughout the                            procedure,  the patient's blood pressure, pulse, and                            oxygen saturations were monitored continuously. The                            [id number] ([id number]) scope was introduced through                            the anus and advanced to the 5 cm into the ileum.                            The colonoscopy was somewhat difficult due to a                            tortuous colon. Successful completion of the                            procedure was aided by straightening and shortening                            the scope to obtain bowel loop reduction. The                            patient tolerated the procedure well. The quality                            of the bowel preparation was good. The terminal                            ileum, ileocecal valve, appendiceal orifice, and                            rectum were photographed. Scope In: 1:42:53 PM Scope Out: 2:00:19 PM Scope Withdrawal Time: 0 hours 13 minutes 35 seconds  Total Procedure Duration: 0 hours 17 minutes 26 seconds  Findings:      The terminal ileum appeared normal.      Three sessile polyps were found in the  rectum, sigmoid colon and       ascending colon. The polyps were 3 to 8 mm in size. These polyps were       removed with a hot snare. Resection and retrieval were complete.      Internal hemorrhoids were found during retroflexion. The hemorrhoids       were moderate.      External hemorrhoids were found during retroflexion. The hemorrhoids       were large.      The recto-sigmoid colon and sigmoid colon were moderately tortuous. Impression:               - The examined portion of the ileum was normal.                           - Three 3 to 8 mm polyps in the rectum, in the                            sigmoid colon and in the ascending colon, removed                            with a hot snare. Resected and retrieved.                           - Internal hemorrhoids.                           - External  hemorrhoids. Moderate Sedation:      Moderate (conscious) sedation was administered by the endoscopy nurse       and supervised by the endoscopist. The following parameters were       monitored: oxygen saturation, heart rate, blood pressure, and response       to care. Total physician intraservice time was 31 minutes. Recommendation:           - Repeat colonoscopy in 3 YEARS IF THREE SIMPLE                            ADENOMAS AND 5 years IF TWO SIMPLE ADENOMAS for                            surveillance.                           - High fiber diet.                           - Continue present medications.                           - Await pathology results.                           - Patient has a contact number available for                            emergencies. The signs and symptoms of potential  delayed complications were discussed with the                            patient. Return to normal activities tomorrow.                            Written discharge instructions were provided to the                            patient. Procedure Code(s):        --- Professional ---                           [phone], Colonoscopy, flexible; with removal of                            tumor(s), polyp(s), or other lesion(s) by snare                            technique                           99152, Moderate sedation services provided by the                            same physician or other qualified health care                            professional performing the diagnostic or                            therapeutic service that the sedation supports,                            requiring the presence of an independent trained                            observer to assist in the monitoring of the                            patient's level of consciousness and physiological                            status; initial 15 minutes of intraservice time,                             patient age 64 years or older                           [phone], Moderate sedation services; each additional                            15 minutes intraservice time Diagnosis Code(s):        --- Professional ---  K64.4, Residual hemorrhoidal skin tags                           K64.8, Other hemorrhoids                           K62.1, Rectal polyp                           D12.5, Benign neoplasm of sigmoid colon                           D12.2, Benign neoplasm of ascending colon                           R19.5, Other fecal abnormalities CPT copyright 2016 American Medical Association. All rights reserved. The codes documented in this report are preliminary and upon coder review may  be revised to meet current compliance requirements. Barney Drain, MD Barney Drain MD, MD 01/08/2018 2:08:01 PM This report has been signed electronically. Number of Addenda: 0

## 2018-01-09 LAB — HEMOGLOBIN A1C
Hgb A1c MFr Bld: 6.5 % — ABNORMAL HIGH (ref 4.8–5.6)
MEAN PLASMA GLUCOSE: 140 mg/dL

## 2018-01-11 ENCOUNTER — Encounter: Payer: Self-pay | Admitting: Gastroenterology

## 2018-01-12 ENCOUNTER — Encounter (HOSPITAL_COMMUNITY): Payer: Self-pay | Admitting: Gastroenterology

## 2018-01-12 ENCOUNTER — Telehealth: Payer: Self-pay | Admitting: Gastroenterology

## 2018-01-12 MED ORDER — OMEPRAZOLE 20 MG PO CPDR: DELAYED_RELEASE_CAPSULE | ORAL | 0 refills | Status: DC

## 2018-01-12 MED ORDER — AMOXICILLIN 500 MG PO TABS: ORAL_TABLET | ORAL | 0 refills | Status: DC

## 2018-01-12 MED ORDER — CLARITHROMYCIN 500 MG PO TABS: ORAL_TABLET | ORAL | 0 refills | Status: DC

## 2018-01-12 NOTE — Telephone Encounter (Signed)
PLEASE CALL PT. HIS stomach Bx showed H. Pylori infection. HE needs AMOXICILLIN 500 mg 2 po BID for 10 days and Biaxin 500 mg po bid for 10 days. TAKE Omeprazole 20 mg BID for 1 MONTH then 1 po dAILY FOR 2 MOS. DO NOT TAKE LIPITOR WHILE TAKING THE ANTIBIOTICS. Med side effects include NVD, abd pain, and metallic taste.  Please call pt. He had THREE simple adenomas removed from hIS colon. FOLLOW A High fiber diet. Next TCS IN 3 YEARS.   FOLLOW UP IN 4 MOS E30 heme positive stools/hpylori gastritis.   Next colonoscopy in 3 years.

## 2018-01-13 NOTE — Telephone Encounter (Signed)
PT is aware.

## 2018-01-13 NOTE — Telephone Encounter (Signed)
ON RECALL AND PATIENT SCHEDULED  °

## 2018-01-18 ENCOUNTER — Ambulatory Visit: Payer: Self-pay | Admitting: Physician Assistant

## 2018-02-09 ENCOUNTER — Encounter: Payer: Self-pay | Admitting: Physician Assistant

## 2018-02-15 ENCOUNTER — Encounter: Payer: Self-pay | Admitting: Physician Assistant

## 2018-02-15 ENCOUNTER — Ambulatory Visit: Payer: Self-pay | Admitting: Physician Assistant

## 2018-02-15 VITALS — BP 139/91 | HR 85 | Temp 97.6°F

## 2018-02-15 DIAGNOSIS — Z550 Illiteracy and low-level literacy: Secondary | ICD-10-CM

## 2018-02-15 DIAGNOSIS — E119 Type 2 diabetes mellitus without complications: Secondary | ICD-10-CM

## 2018-02-15 DIAGNOSIS — I10 Essential (primary) hypertension: Secondary | ICD-10-CM

## 2018-02-15 DIAGNOSIS — E785 Hyperlipidemia, unspecified: Secondary | ICD-10-CM

## 2018-02-15 DIAGNOSIS — E669 Obesity, unspecified: Secondary | ICD-10-CM

## 2018-02-15 NOTE — Progress Notes (Signed)
[employer] Patient Assistance Program Application was filled out by LPN with patient present on 01-20-18 for assistance with pylera.  Application was faxed to [phone] on 01-20-18 and on 01-21-18.  When approved medication will be shipped to [name] and patient will be called to pick up and receive medication and medication instructions.

## 2018-02-15 NOTE — Progress Notes (Signed)
BP (!) 139/91   Pulse 85   Temp 97.6 F (36.4 C)   SpO2 96%    Subjective:    Patient ID: Jack Cherry, male    DOB: 1956-07-11, 62 y.o.   MRN: [id number]  HPI: Jack Cherry is a 62 y.o. male presenting on 02/15/2018 for Diabetes; Hyperlipidemia; and Hypertension   HPI  Pt had colonoscopy since his last OV here.    Pt has no complaints today  Relevant past medical, surgical, family and social history reviewed and updated as indicated. Interim medical history since our last visit reviewed. Allergies and medications reviewed and updated.   Current Outpatient Medications:  .  amLODipine (NORVASC) 10 MG tablet, Take 1 tablet (10 mg total) by mouth daily. for blood pressure, Disp: 90 tablet, Rfl: 3 .  atenolol (TENORMIN) 100 MG tablet, TAKE 1 TABLET BY MOUTH ONCE DAILY, Disp: 30 tablet, Rfl: 4 .  atorvastatin (LIPITOR) 20 MG tablet, Take 1 tablet (20 mg total) by mouth daily., Disp: 90 tablet, Rfl: 3 .  hydrochlorothiazide (HYDRODIURIL) 25 MG tablet, Take 1 tablet (25 mg total) by mouth daily., Disp: 90 tablet, Rfl: 3 .  loratadine (CLARITIN) 10 MG tablet, Take 1 tablet (10 mg total) by mouth daily as needed., Disp: 90 tablet, Rfl: 3 .  losartan (COZAAR) 100 MG tablet, Take 1 tablet (100 mg total) by mouth daily., Disp: 90 tablet, Rfl: 3 .  metFORMIN (GLUCOPHAGE XR) 500 MG 24 hr tablet, Take 1 tablet (500 mg total) by mouth daily with breakfast., Disp: 90 tablet, Rfl: 3 .  Omega-3 Fatty Acids (FISH OIL) 1000 MG CAPS, Take 1 capsule (1,000 mg total) by mouth daily., Disp: , Rfl: 0 .  omeprazole (PRILOSEC) 20 MG capsule, 1 PO 30 MINS PRIOR TO BREAKFAST., Disp: 120 capsule, Rfl: 0 .  amoxicillin (AMOXIL) 500 MG tablet, 2 PO BID FOR 10 DAYS (Patient not taking: Reported on 02/15/2018), Disp: 40 tablet, Rfl: 0 .  clarithromycin (BIAXIN) 500 MG tablet, 1 PO BID FOR 10 DAYS. DO NOT TAKE WITH BIAXIN. (Patient not taking: Reported on 02/15/2018), Disp: 20 tablet, Rfl: 0  Review of Systems   Constitutional: Negative for appetite change, chills, diaphoresis, fatigue, fever and unexpected weight change.  HENT: Positive for sneezing. Negative for congestion, dental problem, drooling, ear pain, facial swelling, hearing loss, mouth sores, sore throat, trouble swallowing and voice change.   Eyes: Negative for pain, discharge, redness, itching and visual disturbance.  Respiratory: Positive for cough. Negative for choking, shortness of breath and wheezing.   Cardiovascular: Negative for chest pain, palpitations and leg swelling.  Gastrointestinal: Negative for abdominal pain, blood in stool, constipation, diarrhea and vomiting.  Endocrine: Negative for cold intolerance, heat intolerance and polydipsia.  Genitourinary: Negative for decreased urine volume, dysuria and hematuria.  Musculoskeletal: Negative for arthralgias, back pain and gait problem.  Skin: Negative for rash.  Allergic/Immunologic: Positive for environmental allergies.  Neurological: Negative for seizures, syncope, light-headedness and headaches.  Hematological: Negative for adenopathy.  Psychiatric/Behavioral: Negative for agitation, dysphoric mood and suicidal ideas. The patient is not nervous/anxious.     Per HPI unless specifically indicated above     Objective:    BP (!) 139/91   Pulse 85   Temp 97.6 F (36.4 C)   SpO2 96%   Wt Readings from Last 3 Encounters:  01/08/18 255 lb (115.7 kg)  12/08/17 255 lb 6.4 oz (115.8 kg)  07/20/17 260 lb (117.9 kg)    Physical Exam  Constitutional: He is  oriented to person, place, and time. He appears well-developed and well-nourished.  HENT:  Head: Normocephalic and atraumatic.  Neck: Neck supple.  Cardiovascular: Normal rate and regular rhythm.  Pulmonary/Chest: Effort normal and breath sounds normal. He has no wheezes.  Abdominal: Soft. Bowel sounds are normal. There is no hepatosplenomegaly. There is no tenderness.  Musculoskeletal: He exhibits no edema.   Lymphadenopathy:    He has no cervical adenopathy.  Neurological: He is alert and oriented to person, place, and time.  Skin: Skin is warm and dry.  Psychiatric: He has a normal mood and affect. His behavior is normal.  Vitals reviewed.   Results for orders placed or performed during the hospital encounter of 01/08/18  Hemoglobin A1c  Result Value Ref Range   Hgb A1c MFr Bld 6.5 (H) 4.8 - 5.6 %   Mean Plasma Glucose 140 mg/dL  Comprehensive metabolic panel  Result Value Ref Range   Sodium 140 135 - 145 mmol/L   Potassium 3.7 3.5 - 5.1 mmol/L   Chloride 100 (L) 101 - 111 mmol/L   CO2 28 22 - 32 mmol/L   Glucose, Bld 109 (H) 65 - 99 mg/dL   BUN 16 6 - 20 mg/dL   Creatinine, Ser 1.10 0.61 - 1.24 mg/dL   Calcium 9.2 8.9 - 10.3 mg/dL   Total Protein 7.6 6.5 - 8.1 g/dL   Albumin 4.1 3.5 - 5.0 g/dL   AST 22 15 - 41 U/L   ALT 15 (L) 17 - 63 U/L   Alkaline Phosphatase 51 38 - 126 U/L   Total Bilirubin 0.8 0.3 - 1.2 mg/dL   GFR calc non Af Amer >60 >60 mL/min   GFR calc Af Amer >60 >60 mL/min   Anion gap 12 5 - 15  Lipid panel  Result Value Ref Range   Cholesterol 135 0 - 200 mg/dL   Triglycerides 171 (H) <150 mg/dL   HDL 32 (L) >40 mg/dL   Total CHOL/HDL Ratio 4.2 RATIO   VLDL 34 0 - 40 mg/dL   LDL Cholesterol 69 0 - 99 mg/dL      Assessment & Plan:    Encounter Diagnoses  Name Primary?  . Type 2 diabetes mellitus without complication, unspecified whether long term insulin use ([place]) Yes  . Essential hypertension, benign   . Hyperlipidemia, unspecified hyperlipidemia type   . 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 Obesity, unspecified classification, unspecified obesity type, unspecified whether serious comorbidity present     -pt to continue curent medications -follow up  3 months.  RTO sooner prn

## 2018-03-09 ENCOUNTER — Telehealth: Payer: Self-pay | Admitting: Student

## 2018-03-09 ENCOUNTER — Other Ambulatory Visit: Payer: Self-pay | Admitting: Physician Assistant

## 2018-03-09 MED ORDER — OMEPRAZOLE 20 MG PO CPDR: DELAYED_RELEASE_CAPSULE | ORAL | 0 refills | Status: DC

## 2018-03-09 MED ORDER — ATORVASTATIN CALCIUM 20 MG PO TABS: 20.0000 mg | ORAL_TABLET | Freq: Every day | ORAL | 3 refills | Status: DC

## 2018-03-09 MED ORDER — HYDROCHLOROTHIAZIDE 25 MG PO TABS: 25.0000 mg | ORAL_TABLET | Freq: Every day | ORAL | 3 refills | Status: DC

## 2018-03-09 MED ORDER — METFORMIN HCL ER 500 MG PO TB24: 500.0000 mg | ORAL_TABLET | Freq: Every day | ORAL | 3 refills | Status: DC

## 2018-03-09 MED ORDER — LOSARTAN POTASSIUM 100 MG PO TABS: 100.0000 mg | ORAL_TABLET | Freq: Every day | ORAL | 3 refills | Status: DC

## 2018-03-09 MED ORDER — AMLODIPINE BESYLATE 10 MG PO TABS: 10.0000 mg | ORAL_TABLET | Freq: Every day | ORAL | 3 refills | Status: DC

## 2018-03-09 NOTE — Telephone Encounter (Signed)
LPN called [facility] pharmacy to check on medications. LPN spoke with [facility] and reordered:  Amlodipine, atenolol, atorvastatin, hctz, loratadine, losartan, metformin, omeprazole.  Per [facility]arita medications will be processed.

## 2018-03-15 NOTE — Telephone Encounter (Signed)
Called and left voicemail to notify that PA sent new prescriptions to [facility] on 03-09-18 and should arrive at patient's address within 7-10 business days.

## 2018-04-04 ENCOUNTER — Other Ambulatory Visit: Payer: Self-pay | Admitting: Physician Assistant

## 2018-05-09 ENCOUNTER — Other Ambulatory Visit: Payer: Self-pay | Admitting: Gastroenterology

## 2018-05-13 ENCOUNTER — Ambulatory Visit: Payer: Self-pay | Admitting: Gastroenterology

## 2018-05-13 ENCOUNTER — Other Ambulatory Visit (HOSPITAL_COMMUNITY)
Admission: RE | Admit: 2018-05-13 | Discharge: 2018-05-13 | Disposition: A | Discharge status: Home / Self Care | Payer: Self-pay | Source: Ambulatory Visit | Attending: Physician Assistant | Admitting: Physician Assistant

## 2018-05-13 DIAGNOSIS — I10 Essential (primary) hypertension: Secondary | ICD-10-CM | POA: Insufficient documentation

## 2018-05-13 DIAGNOSIS — E119 Type 2 diabetes mellitus without complications: Secondary | ICD-10-CM | POA: Insufficient documentation

## 2018-05-13 DIAGNOSIS — E785 Hyperlipidemia, unspecified: Secondary | ICD-10-CM | POA: Insufficient documentation

## 2018-05-13 LAB — LIPID PANEL
CHOL/HDL RATIO: 3.9 ratio
Cholesterol: 135 mg/dL (ref 0–200)
HDL: 35 mg/dL — AB (ref >40)
LDL CALC: 80 mg/dL (ref 0–99)
Triglycerides: 98 mg/dL (ref <150)
VLDL: 20 mg/dL (ref 0–40)

## 2018-05-13 LAB — COMPREHENSIVE METABOLIC PANEL
ALBUMIN: 4.1 g/dL (ref 3.5–5.0)
ALT: 23 U/L (ref 0–44)
ANION GAP: 7 (ref 5–15)
AST: 26 U/L (ref 15–41)
Alkaline Phosphatase: 52 U/L (ref 38–126)
BUN: 20 mg/dL (ref 8–23)
CHLORIDE: 107 mmol/L (ref 98–111)
CO2: 26 mmol/L (ref 22–32)
Calcium: 9.5 mg/dL (ref 8.9–10.3)
Creatinine, Ser: 1.17 mg/dL (ref 0.61–1.24)
GFR calc non Af Amer: >60 mL/min (ref >60)
GLUCOSE: 119 mg/dL — AB (ref 70–99)
POTASSIUM: 4.1 mmol/L (ref 3.5–5.1)
Sodium: 140 mmol/L (ref 135–145)
Total Bilirubin: 0.5 mg/dL (ref 0.3–1.2)
Total Protein: 8 g/dL (ref 6.5–8.1)

## 2018-05-13 LAB — HEMOGLOBIN A1C
Hgb A1c MFr Bld: 7.2 % — ABNORMAL HIGH (ref 4.8–5.6)
MEAN PLASMA GLUCOSE: 159.94 mg/dL

## 2018-05-14 LAB — MICROALBUMIN, URINE: Microalb, Ur: 25.9 ug/mL — ABNORMAL HIGH

## 2018-05-17 ENCOUNTER — Encounter: Payer: Self-pay | Admitting: Physician Assistant

## 2018-05-17 ENCOUNTER — Ambulatory Visit: Payer: Self-pay | Admitting: Physician Assistant

## 2018-05-17 VITALS — BP 142/80 | HR 70 | Temp 96.6°F | Ht 72.0 in | Wt 265.0 lb

## 2018-05-17 DIAGNOSIS — E669 Obesity, unspecified: Secondary | ICD-10-CM

## 2018-05-17 DIAGNOSIS — Z550 Illiteracy and low-level literacy: Secondary | ICD-10-CM

## 2018-05-17 DIAGNOSIS — I10 Essential (primary) hypertension: Secondary | ICD-10-CM

## 2018-05-17 DIAGNOSIS — A048 Other specified bacterial intestinal infections: Secondary | ICD-10-CM

## 2018-05-17 DIAGNOSIS — E119 Type 2 diabetes mellitus without complications: Secondary | ICD-10-CM

## 2018-05-17 DIAGNOSIS — E785 Hyperlipidemia, unspecified: Secondary | ICD-10-CM

## 2018-05-17 MED ORDER — BIS SUBCIT-METRONID-TETRACYC 140-125-125 MG PO CAPS: 3.0000 | ORAL_CAPSULE | Freq: Three times a day (TID) | ORAL | 0 refills | Status: DC

## 2018-05-17 NOTE — Progress Notes (Signed)
BP (!) 142/80 (BP Location: Right Arm, Patient Position: Sitting, Cuff Size: Normal)   Pulse 70   Temp (!) 96.6 F (35.9 C)   Wt 265 lb (120.2 kg)   SpO2 95%   BMI 35.94 kg/m    Subjective:    Patient ID: Jack Cherry, male    DOB: [birth year], 62 y.o.   MRN: [id number]  HPI: Jack Cherry is a 62 y.o. male presenting on 05/17/2018 for No chief complaint on file.   HPI   Pt here today to follow up DM, hyperlipidemia and HTN.    Pt missed his follow up with GI- he had gone to the hospital instead of their office.   He has rescheduled and has follow up there next month.  Pt was prescribed rx by GI for h pylori infection that he could not afford.  pylera was obtained for him through assistance program.  It has been sitting in the office for a month but he didn't come by to pick it up.    Pt only coughs a little bit.  Maybe only once or twice a day.  He has Not much wheezing, just occasionally when working outside in the heat.   He states no sob and the cough doesn't bother him.   Pt denies CP.   He says he feels pretty good.   Relevant past medical, surgical, family and social history reviewed and updated as indicated. Interim medical history since our last visit reviewed. Allergies and medications reviewed and updated.   Current Outpatient Medications:  .  amLODipine (NORVASC) 10 MG tablet, Take 1 tablet (10 mg total) by mouth daily. for blood pressure, Disp: 90 tablet, Rfl: 3 .  atenolol (TENORMIN) 100 MG tablet, TAKE 1 TABLET BY MOUTH ONCE DAILY, Disp: 30 tablet, Rfl: 4 .  atorvastatin (LIPITOR) 20 MG tablet, Take 1 tablet (20 mg total) by mouth daily., Disp: 90 tablet, Rfl: 3 .  hydrochlorothiazide (HYDRODIURIL) 25 MG tablet, Take 1 tablet (25 mg total) by mouth daily., Disp: 90 tablet, Rfl: 3 .  loratadine (CLARITIN) 10 MG tablet, Take 1 tablet (10 mg total) by mouth daily as needed., Disp: 90 tablet, Rfl: 3 .  losartan (COZAAR) 100 MG tablet, Take 1 tablet (100 mg total)  by mouth daily., Disp: 90 tablet, Rfl: 3 .  metFORMIN (GLUCOPHAGE XR) 500 MG 24 hr tablet, Take 1 tablet (500 mg total) by mouth daily with breakfast., Disp: 90 tablet, Rfl: 3 .  Omega-3 Fatty Acids (FISH OIL) 1000 MG CAPS, Take 1 capsule (1,000 mg total) by mouth daily., Disp: , Rfl: 0 .  omeprazole (PRILOSEC) 20 MG capsule, 1 PO 30 MINS PRIOR TO BREAKFAST., Disp: 120 capsule, Rfl: 0   Review of Systems  Constitutional: Negative for appetite change, chills, diaphoresis, fatigue, fever and unexpected weight change.  HENT: Positive for sneezing. Negative for congestion, dental problem, drooling, ear pain, facial swelling, hearing loss, mouth sores, sore throat, trouble swallowing and voice change.   Eyes: Negative for pain, discharge, redness, itching and visual disturbance.  Respiratory: Positive for cough and wheezing. Negative for choking and shortness of breath.   Cardiovascular: Negative for chest pain, palpitations and leg swelling.  Gastrointestinal: Negative for abdominal pain, blood in stool, constipation, diarrhea and vomiting.  Endocrine: Negative for cold intolerance, heat intolerance and polydipsia.  Genitourinary: Negative for decreased urine volume, dysuria and hematuria.  Musculoskeletal: Positive for arthralgias and back pain. Negative for gait problem.  Skin: Negative for rash.  Allergic/Immunologic: Negative for  environmental allergies.  Neurological: Negative for seizures, syncope, light-headedness and headaches.  Hematological: Negative for adenopathy.  Psychiatric/Behavioral: Negative for agitation, dysphoric mood and suicidal ideas. The patient is not nervous/anxious.     Per HPI unless specifically indicated above     Objective:    BP (!) 142/80 (BP Location: Right Arm, Patient Position: Sitting, Cuff Size: Normal)   Pulse 70   Temp (!) 96.6 F (35.9 C)   Wt 265 lb (120.2 kg)   SpO2 95%   BMI 35.94 kg/m   Wt Readings from Last 3 Encounters:  05/17/18 265 lb  (120.2 kg)  01/08/18 255 lb (115.7 kg)  12/08/17 255 lb 6.4 oz (115.8 kg)    Physical Exam  Constitutional: He is oriented to person, place, and time. He appears well-developed and well-nourished.  HENT:  Head: Normocephalic and atraumatic.  Neck: Neck supple.  Cardiovascular: Normal rate, regular rhythm and normal heart sounds.  occassional premature beat  Pulmonary/Chest: Effort normal and breath sounds normal. He has no wheezes.  Abdominal: Soft. Bowel sounds are normal. There is no hepatosplenomegaly. There is no tenderness.  Musculoskeletal: He exhibits no edema.  Lymphadenopathy:    He has no cervical adenopathy.  Neurological: He is alert and oriented to person, place, and time.  Skin: Skin is warm and dry.  Psychiatric: He has a normal mood and affect. His behavior is normal.  Vitals reviewed.   Results for orders placed or performed during the hospital encounter of 05/13/18  Hemoglobin A1c  Result Value Ref Range   Hgb A1c MFr Bld 7.2 (H) 4.8 - 5.6 %   Mean Plasma Glucose 159.94 mg/dL  Lipid panel  Result Value Ref Range   Cholesterol 135 0 - 200 mg/dL   Triglycerides 98 <150 mg/dL   HDL 35 (L) >40 mg/dL   Total CHOL/HDL Ratio 3.9 RATIO   VLDL 20 0 - 40 mg/dL   LDL Cholesterol 80 0 - 99 mg/dL  Comprehensive metabolic panel  Result Value Ref Range   Sodium 140 135 - 145 mmol/L   Potassium 4.1 3.5 - 5.1 mmol/L   Chloride 107 98 - 111 mmol/L   CO2 26 22 - 32 mmol/L   Glucose, Bld 119 (H) 70 - 99 mg/dL   BUN 20 8 - 23 mg/dL   Creatinine, Ser 1.17 0.61 - 1.24 mg/dL   Calcium 9.5 8.9 - 10.3 mg/dL   Total Protein 8.0 6.5 - 8.1 g/dL   Albumin 4.1 3.5 - 5.0 g/dL   AST 26 15 - 41 U/L   ALT 23 0 - 44 U/L   Alkaline Phosphatase 52 38 - 126 U/L   Total Bilirubin 0.5 0.3 - 1.2 mg/dL   GFR calc non Af Amer >60 >60 mL/min   GFR calc Af Amer >60 >60 mL/min   Anion gap 7 5 - 15  Microalbumin, urine  Result Value Ref Range   Microalb, Ur 25.9 (H) Not Estab. ug/mL       Assessment & Plan:   Encounter Diagnoses  Name Primary?  . Type 2 diabetes mellitus without complication, unspecified whether long term insulin use ([place]) Yes  . Essential hypertension, benign   . Hyperlipidemia, unspecified hyperlipidemia type   . H. pylori infection   . Obesity, unspecified classification, unspecified obesity type, unspecified whether serious comorbidity present   . 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  Counseled on weight- 10 pound gain since his OV in March -pylera that was ordered through pt assistance was given to pt and he was counseled to take the omeprazole bid with it -Pt to follow up 3 months.  Will see pt at [facility] office.  Pt to RTO sooner prn

## 2018-06-11 ENCOUNTER — Ambulatory Visit (INDEPENDENT_AMBULATORY_CARE_PROVIDER_SITE_OTHER): Payer: Self-pay | Admitting: Gastroenterology

## 2018-06-11 ENCOUNTER — Encounter: Payer: Self-pay | Admitting: Gastroenterology

## 2018-06-11 VITALS — BP 106/67 | HR 65 | Temp 98.5°F | Ht 73.0 in | Wt 261.0 lb

## 2018-06-11 DIAGNOSIS — D649 Anemia, unspecified: Secondary | ICD-10-CM

## 2018-06-11 DIAGNOSIS — K2970 Gastritis, unspecified, without bleeding: Secondary | ICD-10-CM

## 2018-06-11 DIAGNOSIS — B9681 Helicobacter pylori [H. pylori] as the cause of diseases classified elsewhere: Secondary | ICD-10-CM

## 2018-06-11 NOTE — Progress Notes (Signed)
Referring Provider: Soyla Dryer, PA-C Primary Care Physician:  Soyla Dryer, PA-C  Primary GI: Dr. Oneida Alar   Chief Complaint  Patient presents with  . Follow-up    no complaints today    HPI:   Jack Cherry is a 62 y.o. male presenting today with a history of normocytic anemia, s/p EGD/TCS with findings of simple adenomas on colonoscopy and H.pylori gastritis. He completed Pylera. Presents today in follow-up.  No abdominal pain, N/V, constipation. Taking Prilosec. No concerning upper or lower GI symptoms. GERD is controlled on medication. Had diarrhea right after taking antibiotics for just one day, small scant amount of bleeding.    Past Medical History:  Diagnosis Date  . 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 Diabetes ([place])   . High cholesterol   . Hypertension     Past Surgical History:  Procedure Laterality Date  . COLONOSCOPY N/A 01/08/2018   Dr. Oneida Alar three 3-8 mm pol;yps, simple adenomas. internal hemorrhoids. next colonoscopy in 3 years  . ESOPHAGOGASTRODUODENOSCOPY N/A 01/08/2018   Dr. Oneida Alar: normal esophagus, mild gastritis/duodenitis, H.pylori  . HERNIA REPAIR      Current Outpatient Medications  Medication Sig Dispense Refill  . amLODipine (NORVASC) 10 MG tablet Take 1 tablet (10 mg total) by mouth daily. for blood pressure 90 tablet 3  . atenolol (TENORMIN) 100 MG tablet TAKE 1 TABLET BY MOUTH ONCE DAILY 30 tablet 4  . atorvastatin (LIPITOR) 20 MG tablet Take 1 tablet (20 mg total) by mouth daily. 90 tablet 3  . hydrochlorothiazide (HYDRODIURIL) 25 MG tablet Take 1 tablet (25 mg total) by mouth daily. 90 tablet 3  . loratadine (CLARITIN) 10 MG tablet Take 1 tablet (10 mg total) by mouth daily as needed. 90 tablet 3  . losartan (COZAAR) 100 MG tablet Take 1 tablet (100 mg total) by mouth daily. 90 tablet 3  . metFORMIN (GLUCOPHAGE XR) 500 MG 24 hr tablet Take 1 tablet (500 mg total) by mouth daily  with breakfast. 90 tablet 3  . Omega-3 Fatty Acids (FISH OIL) 1000 MG CAPS Take 1 capsule (1,000 mg total) by mouth daily.  0  . omeprazole (PRILOSEC) 20 MG capsule 1 PO 30 MINS PRIOR TO BREAKFAST. 120 capsule 0   No current facility-administered medications for this visit.     Allergies as of 06/11/2018  . (No Known Allergies)    Family History  Problem Relation Age of Onset  . Cancer Mother        not sure what kind    Social History   Socioeconomic History  . Marital status: Single    Spouse name: Not on file  . Number of children: Not on file  . Years of education: Not on file  . Highest education level: Not on file  Occupational History  . Not on file  Social Needs  . Financial resource strain: Not on file  . Food insecurity:    Worry: Not on file    Inability: Not on file  . Transportation needs:    Medical: Not on file    Non-medical: Not on file  Tobacco Use  . Smoking status: Never Smoker  . Smokeless tobacco: Never Used  Substance and Sexual Activity  . Alcohol use: No  . Drug use: Yes    Types: Marijuana  . Sexual activity: Never  Lifestyle  . Physical activity:    Days per week: Not on file  Minutes per session: Not on file  . Stress: Not on file  Relationships  . Social connections:    Talks on phone: Not on file    Gets together: Not on file    Attends religious service: Not on file    Active member of club or organization: Not on file    Attends meetings of clubs or organizations: Not on file    Relationship status: Not on file  Other Topics Concern  . Not on file  Social History Narrative  . Not on file    Review of Systems: Gen: Denies fever, chills, anorexia. Denies fatigue, weakness, weight loss.  CV: Denies chest pain, palpitations, syncope, peripheral edema, and claudication. Resp: Denies dyspnea at rest, cough, wheezing, coughing up blood, and pleurisy. GI: see HPI  Derm: Denies rash, itching, dry skin Psych: Denies  depression, anxiety, memory loss, confusion. No homicidal or suicidal ideation.  Heme: see HPI   Physical Exam: BP 106/67   Pulse 65   Temp 98.5 F (36.9 C) (Oral)   Ht 6\' 1"  (1.854 m)   Wt 261 lb (118.4 kg)   BMI 34.43 kg/m  General:   Alert and oriented. No distress noted. Pleasant and cooperative.  Head:  Normocephalic and atraumatic. Eyes:  Conjuctiva clear without scleral icterus. Mouth:  Oral mucosa pink and moist.  Abdomen:  +BS, soft, non-tender and non-distended. No rebound or guarding. No HSM or masses noted. Msk:  Symmetrical without gross deformities. Normal posture. Extremities:  Without edema. Neurologic:  Alert and  oriented x4 Psych:  Alert and cooperative. Normal mood and affect.  Lab Results  Component Value Date   WBC 3.5 (L) 07/13/2017   HGB 11.9 (L) 07/13/2017   HCT 36.1 (L) 07/13/2017   MCV 95.0 07/13/2017   PLT 225 07/13/2017

## 2018-06-11 NOTE — Assessment & Plan Note (Signed)
On EGD. S/p treatment with Pylera. I have written a "star" on his Prilosec bottle to stop taking today. On the 23rd, present to the hospital lab to complete urea breath test to ensure eradication. Resume Prilosec after the test. Return in 6 months. Doing well.

## 2018-06-11 NOTE — Patient Instructions (Signed)
Stop Prilosec (omeprazole) today. Do not take it for 2 weeks.   On August 23rd, go to the hospital lab to have the breath test and blood work done. After you get that done, you can restart Prilosec.  We will see you in 6 months!  It was a pleasure to see you today. I strive to create trusting relationships with patients to provide genuine, compassionate, and quality care. I value your feedback. If you receive a survey regarding your visit,  I greatly appreciate you taking time to fill this out.   Annitta Needs, PhD, ANP-BC [facility]

## 2018-06-11 NOTE — Assessment & Plan Note (Signed)
Check CBC, iron studies in 2 weeks. Likely multifactorial and H.pylori contributing. Now that it is eradicated, I suspect this may have resolved. Colonoscopy on file. Next surveillance in 3 years. Return in 6 months.

## 2018-06-11 NOTE — Progress Notes (Signed)
cc'ed to pcp °

## 2018-06-23 ENCOUNTER — Telehealth: Payer: Self-pay

## 2018-06-23 NOTE — Telephone Encounter (Signed)
I have faxed the lab order to [facility] for pt to do on 06/25/2018.

## 2018-06-25 ENCOUNTER — Other Ambulatory Visit (HOSPITAL_COMMUNITY)
Admission: RE | Admit: 2018-06-25 | Discharge: 2018-06-25 | Disposition: A | Discharge status: Home / Self Care | Payer: Self-pay | Source: Ambulatory Visit | Attending: Gastroenterology | Admitting: Gastroenterology

## 2018-06-25 ENCOUNTER — Telehealth: Payer: Self-pay

## 2018-06-25 ENCOUNTER — Other Ambulatory Visit (HOSPITAL_COMMUNITY)
Admission: RE | Admit: 2018-06-25 | Discharge: 2018-06-25 | Disposition: A | Discharge status: Home / Self Care | Payer: Self-pay | Source: Ambulatory Visit | Attending: Physician Assistant | Admitting: Physician Assistant

## 2018-06-25 DIAGNOSIS — I10 Essential (primary) hypertension: Secondary | ICD-10-CM | POA: Insufficient documentation

## 2018-06-25 DIAGNOSIS — B9681 Helicobacter pylori [H. pylori] as the cause of diseases classified elsewhere: Secondary | ICD-10-CM | POA: Insufficient documentation

## 2018-06-25 DIAGNOSIS — E119 Type 2 diabetes mellitus without complications: Secondary | ICD-10-CM | POA: Insufficient documentation

## 2018-06-25 DIAGNOSIS — K2970 Gastritis, unspecified, without bleeding: Secondary | ICD-10-CM | POA: Insufficient documentation

## 2018-06-25 LAB — BASIC METABOLIC PANEL
ANION GAP: 9 (ref 5–15)
BUN: 18 mg/dL (ref 8–23)
CALCIUM: 9 mg/dL (ref 8.9–10.3)
CHLORIDE: 109 mmol/L (ref 98–111)
CO2: 23 mmol/L (ref 22–32)
Creatinine, Ser: 1.12 mg/dL (ref 0.61–1.24)
GFR calc Af Amer: >60 mL/min (ref >60)
GFR calc non Af Amer: >60 mL/min (ref >60)
GLUCOSE: 112 mg/dL — AB (ref 70–99)
Potassium: 3.7 mmol/L (ref 3.5–5.1)
Sodium: 141 mmol/L (ref 135–145)

## 2018-06-25 LAB — CBC WITH DIFFERENTIAL/PLATELET
Basophils Absolute: 0 10*3/uL (ref 0.0–0.1)
Basophils Relative: 1 %
EOS ABS: 0.1 10*3/uL (ref 0.0–0.7)
EOS PCT: 1 %
HCT: 35.4 % — ABNORMAL LOW (ref 39.0–52.0)
HEMOGLOBIN: 11.6 g/dL — AB (ref 13.0–17.0)
LYMPHS ABS: 1.4 10*3/uL (ref 0.7–4.0)
Lymphocytes Relative: 37 %
MCH: 30.6 pg (ref 26.0–34.0)
MCHC: 32.8 g/dL (ref 30.0–36.0)
MCV: 93.4 fL (ref 78.0–100.0)
MONOS PCT: 6 %
Monocytes Absolute: 0.2 10*3/uL (ref 0.1–1.0)
NEUTROS PCT: 55 %
Neutro Abs: 2.1 10*3/uL (ref 1.7–7.7)
Platelets: 222 10*3/uL (ref 150–400)
RBC: 3.79 MIL/uL — AB (ref 4.22–5.81)
RDW: 15.3 % (ref 11.5–15.5)
WBC: 3.9 10*3/uL — ABNORMAL LOW (ref 4.0–10.5)

## 2018-06-25 LAB — HEMOGLOBIN A1C
Hgb A1c MFr Bld: 6.4 % — ABNORMAL HIGH (ref 4.8–5.6)
Mean Plasma Glucose: 136.98 mg/dL

## 2018-06-25 LAB — FERRITIN: FERRITIN: 354 ng/mL — AB (ref 24–336)

## 2018-06-25 LAB — IRON AND TIBC
IRON: 74 ug/dL (ref 45–182)
Saturation Ratios: 26 % (ref 17.9–39.5)
TIBC: 287 ug/dL (ref 250–450)
UIBC: 213 ug/dL

## 2018-06-25 NOTE — Telephone Encounter (Signed)
Received a call from [facility], Pt showed up to have lab work and H. Pylori Breath test. Pt had labs drawn but [facility] wasn't sure what type of H. Pylori testing to do. Spoke with AB and she agreed that pt should go across the street to [facility] to have H. Pylori breath test.

## 2018-06-30 ENCOUNTER — Telehealth: Payer: Self-pay

## 2018-06-30 ENCOUNTER — Other Ambulatory Visit (HOSPITAL_COMMUNITY)
Admission: RE | Admit: 2018-06-30 | Discharge: 2018-06-30 | Disposition: A | Discharge status: Home / Self Care | Payer: Self-pay | Source: Ambulatory Visit | Attending: Physician Assistant | Admitting: Physician Assistant

## 2018-06-30 ENCOUNTER — Other Ambulatory Visit: Payer: Self-pay

## 2018-06-30 DIAGNOSIS — B9681 Helicobacter pylori [H. pylori] as the cause of diseases classified elsewhere: Secondary | ICD-10-CM

## 2018-06-30 DIAGNOSIS — R69 Illness, unspecified: Secondary | ICD-10-CM | POA: Insufficient documentation

## 2018-06-30 DIAGNOSIS — K2970 Gastritis, unspecified, without bleeding: Principal | ICD-10-CM

## 2018-06-30 NOTE — Telephone Encounter (Signed)
Vicente Males, I received a call from [place] at [facility] saying the pt came there for labs and H pylori breath test. She said he is a patient at the [facility] and she asked me to fax orders to them and they can redo orders and have pt go to the hospital to do the labs and he would not have to pay.  Elmo Putt said pt had labs all but H Pylori done at [facility] already.  Please advise!

## 2018-06-30 NOTE — Telephone Encounter (Signed)
Per Talmage Coin to do the H pylori Stool antigen since he is [facility] pt. Carmelia Bake is aware at [facility] lab and I have faxed the order to them.

## 2018-06-30 NOTE — Progress Notes (Signed)
Ferritin slightly elevated but sats are normal. Hgb stable. Will await findings of H.pylori stool antigen.

## 2018-06-30 NOTE — Telephone Encounter (Signed)
T/C from [place] at the [facility]. She said I do not have to fax any orders to her, we just need to put in some orders that can be done at [facility].  Larene Beach said she usually orders the H Pylori stool since they can do that one. Vicente Males, please advise!

## 2018-06-30 NOTE — Telephone Encounter (Signed)
Pt's sister, Otelia Sergeant is aware pt will need to pick up stool containers to do the stool test. She said she will take him to get the containers.

## 2018-06-30 NOTE — Telephone Encounter (Signed)
LMOM to call.

## 2018-07-08 NOTE — Telephone Encounter (Signed)
Pt came by the office today, still confused about the H pylori Breath test. I explained to him the Breath test is not covered with patient assistance. I gave him the order for the H pylori stool test and he is going to [facility] lab to get containers. He is aware they will give him instructions and he will take home and do it and return to the lab at [facility].

## 2018-07-13 ENCOUNTER — Other Ambulatory Visit (HOSPITAL_COMMUNITY)
Admission: RE | Admit: 2018-07-13 | Discharge: 2018-07-13 | Disposition: A | Discharge status: Home / Self Care | Payer: Self-pay | Source: Ambulatory Visit | Attending: Gastroenterology | Admitting: Gastroenterology

## 2018-07-13 DIAGNOSIS — K2970 Gastritis, unspecified, without bleeding: Secondary | ICD-10-CM | POA: Insufficient documentation

## 2018-07-15 LAB — H. PYLORI ANTIGEN, STOOL: H. Pylori Stool Ag, Eia: NEGATIVE

## 2018-07-20 NOTE — Progress Notes (Signed)
H.pylori negative. He can resume PPI if he hasn't already. Let's recheck CBC, iron studies in 3 months.

## 2018-07-21 NOTE — Progress Notes (Signed)
L/M to call.

## 2018-07-22 ENCOUNTER — Other Ambulatory Visit: Payer: Self-pay

## 2018-07-22 DIAGNOSIS — D649 Anemia, unspecified: Secondary | ICD-10-CM

## 2018-07-22 NOTE — Progress Notes (Signed)
Letter mailed with results and plan. Lab orders on file for Dec.2019.

## 2018-07-28 ENCOUNTER — Telehealth: Payer: Self-pay

## 2018-07-28 NOTE — Telephone Encounter (Signed)
Pt walked in to discuss his letters he received about his results. Pt is aware of results and felt better coming in to discuss in person, where he could understand.

## 2018-08-16 ENCOUNTER — Other Ambulatory Visit (HOSPITAL_COMMUNITY)
Admission: RE | Admit: 2018-08-16 | Discharge: 2018-08-16 | Disposition: A | Discharge status: Home / Self Care | Payer: Self-pay | Source: Ambulatory Visit | Attending: Physician Assistant | Admitting: Physician Assistant

## 2018-08-16 DIAGNOSIS — D649 Anemia, unspecified: Secondary | ICD-10-CM | POA: Insufficient documentation

## 2018-08-16 LAB — CBC WITH DIFFERENTIAL/PLATELET
ABS IMMATURE GRANULOCYTES: 0.03 10*3/uL (ref 0.00–0.07)
BASOS ABS: 0.1 10*3/uL (ref 0.0–0.1)
Basophils Relative: 1 %
Eosinophils Absolute: 0.1 10*3/uL (ref 0.0–0.5)
Eosinophils Relative: 2 %
HCT: 39.4 % (ref 39.0–52.0)
Hemoglobin: 12.8 g/dL — ABNORMAL LOW (ref 13.0–17.0)
IMMATURE GRANULOCYTES: 1 %
LYMPHS PCT: 43 %
Lymphs Abs: 1.9 10*3/uL (ref 0.7–4.0)
MCH: 31 pg (ref 26.0–34.0)
MCHC: 32.5 g/dL (ref 30.0–36.0)
MCV: 95.4 fL (ref 80.0–100.0)
Monocytes Absolute: 0.4 10*3/uL (ref 0.1–1.0)
Monocytes Relative: 9 %
NEUTROS ABS: 1.9 10*3/uL (ref 1.7–7.7)
NRBC: 0 % (ref 0.0–0.2)
Neutrophils Relative %: 44 %
Platelets: 247 10*3/uL (ref 150–400)
RBC: 4.13 MIL/uL — AB (ref 4.22–5.81)
RDW: 14.1 % (ref 11.5–15.5)
WBC: 4.4 10*3/uL (ref 4.0–10.5)

## 2018-08-16 LAB — IRON AND TIBC
IRON: 65 ug/dL (ref 45–182)
SATURATION RATIOS: 21 % (ref 17.9–39.5)
TIBC: 317 ug/dL (ref 250–450)
UIBC: 252 ug/dL

## 2018-08-16 LAB — FERRITIN: FERRITIN: 452 ng/mL — AB (ref 24–336)

## 2018-08-17 ENCOUNTER — Other Ambulatory Visit: Payer: Self-pay | Admitting: Physician Assistant

## 2018-08-23 NOTE — Progress Notes (Signed)
Ferritin still >400, iron sats normal though. Ferritin likely elevated in setting of metabolic syndrome. Is he taking any extra iron supplementation?

## 2018-08-24 ENCOUNTER — Encounter: Payer: Self-pay | Admitting: Physician Assistant

## 2018-08-24 ENCOUNTER — Ambulatory Visit: Payer: Self-pay | Admitting: Physician Assistant

## 2018-08-24 VITALS — BP 144/80 | HR 71 | Temp 97.9°F | Ht 73.0 in | Wt 263.5 lb

## 2018-08-24 DIAGNOSIS — E785 Hyperlipidemia, unspecified: Secondary | ICD-10-CM

## 2018-08-24 DIAGNOSIS — I491 Atrial premature depolarization: Secondary | ICD-10-CM

## 2018-08-24 DIAGNOSIS — E669 Obesity, unspecified: Secondary | ICD-10-CM

## 2018-08-24 DIAGNOSIS — R9431 Abnormal electrocardiogram [ECG] [EKG]: Secondary | ICD-10-CM

## 2018-08-24 DIAGNOSIS — I10 Essential (primary) hypertension: Secondary | ICD-10-CM

## 2018-08-24 DIAGNOSIS — Z550 Illiteracy and low-level literacy: Secondary | ICD-10-CM

## 2018-08-24 DIAGNOSIS — R011 Cardiac murmur, unspecified: Secondary | ICD-10-CM

## 2018-08-24 DIAGNOSIS — R351 Nocturia: Secondary | ICD-10-CM

## 2018-08-24 DIAGNOSIS — E119 Type 2 diabetes mellitus without complications: Secondary | ICD-10-CM

## 2018-08-24 LAB — POCT URINALYSIS DIPSTICK
BILIRUBIN UA: NEGATIVE
Glucose, UA: NEGATIVE
KETONES UA: NEGATIVE
Leukocytes, UA: NEGATIVE
NITRITE UA: NEGATIVE
PROTEIN UA: POSITIVE — AB
SPEC GRAV UA: 1.025 (ref 1.010–1.025)
Urobilinogen, UA: 0.2 E.U./dL
pH, UA: 5 (ref 5.0–8.0)

## 2018-08-24 MED ORDER — TAMSULOSIN HCL 0.4 MG PO CAPS: 0.4000 mg | ORAL_CAPSULE | Freq: Every day | ORAL | 1 refills | Status: DC

## 2018-08-24 MED ORDER — LOSARTAN POTASSIUM 100 MG PO TABS: 100.0000 mg | ORAL_TABLET | Freq: Every day | ORAL | 1 refills | Status: DC

## 2018-08-24 NOTE — Progress Notes (Signed)
LMOM to call.

## 2018-08-24 NOTE — Progress Notes (Signed)
BP (!) 144/80 (BP Location: Left Arm, Patient Position: Sitting, Cuff Size: Normal)   Pulse 71   Temp 97.9 F (36.6 C)   Ht 6\' 1"  (1.854 m)   Wt 263 lb 8 oz (119.5 kg)   SpO2 92%   BMI 34.76 kg/m    Subjective:    Patient ID: Jack Cherry, male    DOB: 1956/06/24, 62 y.o.   MRN: [id number]  HPI: Jack Cherry is a 62 y.o. male presenting on 08/24/2018 for Diabetes and Hypertension   HPI  Pt says he gets up to urinate 8 times.  He says during the day he urinates every 3 or 4 hours.   Pt's labs that were ordered for f/u today were done incorrectly onn  06/25/18-  The a1c was 6.4 and BMP stable  Pt is out of his little green pills.  I assume this is the losartan since he doesn't have a bottle with that.  He says he threw the bottle away.  Pt is reminded to not throw the bottle.  He is reminded to call for refill or if he needs to get help from the nurse, to bring his bottle.   Relevant past medical, surgical, family and social history reviewed and updated as indicated. Interim medical history since our last visit reviewed. Allergies and medications reviewed and updated.   Current Outpatient Medications:  .  amLODipine (NORVASC) 10 MG tablet, Take 1 tablet (10 mg total) by mouth daily. for blood pressure, Disp: 90 tablet, Rfl: 3 .  atenolol (TENORMIN) 100 MG tablet, TAKE 1 TABLET BY MOUTH ONCE DAILY, Disp: 30 tablet, Rfl: 4 .  atorvastatin (LIPITOR) 20 MG tablet, Take 1 tablet (20 mg total) by mouth daily., Disp: 90 tablet, Rfl: 3 .  hydrochlorothiazide (HYDRODIURIL) 25 MG tablet, Take 1 tablet (25 mg total) by mouth daily., Disp: 90 tablet, Rfl: 3 .  loratadine (CLARITIN) 10 MG tablet, Take 1 tablet (10 mg total) by mouth daily as needed., Disp: 90 tablet, Rfl: 3 .  metFORMIN (GLUCOPHAGE XR) 500 MG 24 hr tablet, Take 1 tablet (500 mg total) by mouth daily with breakfast., Disp: 90 tablet, Rfl: 3 .  Omega-3 Fatty Acids (FISH OIL) 1000 MG CAPS, Take 1 capsule (1,000 mg total) by  mouth daily., Disp: , Rfl: 0 .  omeprazole (PRILOSEC) 20 MG capsule, TAKE 1 Capsule BY MOUTH EVERY MORNING 30 MINUTES BEFORE BREAKFAST, Disp: 120 capsule, Rfl: 0 .  losartan (COZAAR) 100 MG tablet, Take 1 tablet (100 mg total) by mouth daily. (Patient not taking: Reported on 08/24/2018), Disp: 90 tablet, Rfl: 3   Review of Systems  Respiratory: Negative for shortness of breath.   Cardiovascular: Negative for chest pain.  Gastrointestinal: Negative for abdominal pain.    Per HPI unless specifically indicated above     Objective:    BP (!) 144/80 (BP Location: Left Arm, Patient Position: Sitting, Cuff Size: Normal)   Pulse 71   Temp 97.9 F (36.6 C)   Ht 6\' 1"  (1.854 m)   Wt 263 lb 8 oz (119.5 kg)   SpO2 92%   BMI 34.76 kg/m   Wt Readings from Last 3 Encounters:  08/24/18 263 lb 8 oz (119.5 kg)  06/11/18 261 lb (118.4 kg)  05/17/18 265 lb (120.2 kg)    Physical Exam  Constitutional: He is oriented to person, place, and time. He appears well-developed and well-nourished.  HENT:  Head: Normocephalic and atraumatic.  Neck: Neck supple.  Cardiovascular: Normal rate. An  irregular rhythm present.  Murmur heard. Pulmonary/Chest: Effort normal and breath sounds normal. He has no wheezes.  Abdominal: Soft. Bowel sounds are normal. There is no hepatosplenomegaly. There is no tenderness.  Musculoskeletal: He exhibits no edema.  Lymphadenopathy:    He has no cervical adenopathy.  Neurological: He is alert and oriented to person, place, and time.  Skin: Skin is warm and dry.  Psychiatric: He has a normal mood and affect. His behavior is normal.  Vitals reviewed.   Results for orders placed or performed during the hospital encounter of 08/16/18  CBC with Differential  Result Value Ref Range   WBC 4.4 4.0 - 10.5 K/uL   RBC 4.13 (L) 4.22 - 5.81 MIL/uL   Hemoglobin 12.8 (L) 13.0 - 17.0 g/dL   HCT 39.4 39.0 - 52.0 %   MCV 95.4 80.0 - 100.0 fL   MCH 31.0 26.0 - 34.0 pg   MCHC 32.5  30.0 - 36.0 g/dL   RDW 14.1 11.5 - 15.5 %   Platelets 247 150 - 400 K/uL   nRBC 0.0 0.0 - 0.2 %   Neutrophils Relative % 44 %   Neutro Abs 1.9 1.7 - 7.7 K/uL   Lymphocytes Relative 43 %   Lymphs Abs 1.9 0.7 - 4.0 K/uL   Monocytes Relative 9 %   Monocytes Absolute 0.4 0.1 - 1.0 K/uL   Eosinophils Relative 2 %   Eosinophils Absolute 0.1 0.0 - 0.5 K/uL   Basophils Relative 1 %   Basophils Absolute 0.1 0.0 - 0.1 K/uL   Immature Granulocytes 1 %   Abs Immature Granulocytes 0.03 0.00 - 0.07 K/uL  Ferritin  Result Value Ref Range   Ferritin 452 (H) 24 - 336 ng/mL  Iron and TIBC  Result Value Ref Range   Iron 65 45 - 182 ug/dL   TIBC 317 250 - 450 ug/dL   Saturation Ratios 21 17.9 - 39.5 %   UIBC 252 ug/dL    EKG- frequent pac's.  Otherwise no changes from ekg done 05/19/16     Assessment & Plan:   Encounter Diagnoses  Name Primary?  . Type 2 diabetes mellitus without complication, unspecified whether long term insulin use ([place]) Yes  . Essential hypertension, benign   . Nocturia   . Heart murmur   . 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 Hyperlipidemia, unspecified hyperlipidemia type   . Obesity, unspecified classification, unspecified obesity type, unspecified whether serious comorbidity present   . PAC (premature atrial contraction)   . Atrial premature contractions   . Abnormal EKG      -Reviewed labs from august -Counseled pt to avoid throwing away bottle until he gets his refill -trial of tamsulosin (flomax) 0.4mg  qd for nocturia.  Pt counseled to avoid drinking a lot at night before bed -update echo -[facility] has [facility], he thinks -pt to get back on his losartan -pt to follow up 3 months.  RTO sooner prn

## 2018-08-25 NOTE — Progress Notes (Signed)
TO AB

## 2018-08-25 NOTE — Progress Notes (Signed)
Pt is aware and said he is not taking any extra iron.

## 2018-09-15 ENCOUNTER — Other Ambulatory Visit: Payer: Self-pay | Admitting: Physician Assistant

## 2018-09-15 ENCOUNTER — Ambulatory Visit (HOSPITAL_COMMUNITY): Payer: Self-pay | Attending: Physician Assistant

## 2018-09-15 DIAGNOSIS — Z125 Encounter for screening for malignant neoplasm of prostate: Secondary | ICD-10-CM

## 2018-09-15 DIAGNOSIS — E119 Type 2 diabetes mellitus without complications: Secondary | ICD-10-CM

## 2018-09-15 DIAGNOSIS — I10 Essential (primary) hypertension: Secondary | ICD-10-CM

## 2018-09-15 DIAGNOSIS — E785 Hyperlipidemia, unspecified: Secondary | ICD-10-CM

## 2018-09-16 ENCOUNTER — Other Ambulatory Visit: Payer: Self-pay

## 2018-09-16 DIAGNOSIS — D649 Anemia, unspecified: Secondary | ICD-10-CM

## 2018-10-21 ENCOUNTER — Other Ambulatory Visit (HOSPITAL_COMMUNITY)
Admission: RE | Admit: 2018-10-21 | Discharge: 2018-10-21 | Disposition: A | Discharge status: Home / Self Care | Payer: Self-pay | Source: Ambulatory Visit | Attending: Gastroenterology | Admitting: Gastroenterology

## 2018-10-21 DIAGNOSIS — D649 Anemia, unspecified: Secondary | ICD-10-CM | POA: Insufficient documentation

## 2018-10-21 LAB — CBC WITH DIFFERENTIAL/PLATELET
Abs Immature Granulocytes: 0.02 10*3/uL (ref 0.00–0.07)
Basophils Absolute: 0 10*3/uL (ref 0.0–0.1)
Basophils Relative: 1 %
Eosinophils Absolute: 0 10*3/uL (ref 0.0–0.5)
Eosinophils Relative: 1 %
HCT: 38.3 % — ABNORMAL LOW (ref 39.0–52.0)
Hemoglobin: 12.4 g/dL — ABNORMAL LOW (ref 13.0–17.0)
Immature Granulocytes: 1 %
Lymphocytes Relative: 41 %
Lymphs Abs: 1.4 10*3/uL (ref 0.7–4.0)
MCH: 30.2 pg (ref 26.0–34.0)
MCHC: 32.4 g/dL (ref 30.0–36.0)
MCV: 93.2 fL (ref 80.0–100.0)
Monocytes Absolute: 0.3 10*3/uL (ref 0.1–1.0)
Monocytes Relative: 8 %
Neutro Abs: 1.7 10*3/uL (ref 1.7–7.7)
Neutrophils Relative %: 48 %
Platelets: 217 10*3/uL (ref 150–400)
RBC: 4.11 MIL/uL — ABNORMAL LOW (ref 4.22–5.81)
RDW: 14.1 % (ref 11.5–15.5)
WBC: 3.5 10*3/uL — ABNORMAL LOW (ref 4.0–10.5)
nRBC: 0 % (ref 0.0–0.2)

## 2018-10-21 LAB — IRON AND TIBC
Iron: 73 ug/dL (ref 45–182)
Saturation Ratios: 22 % (ref 17.9–39.5)
TIBC: 333 ug/dL (ref 250–450)
UIBC: 260 ug/dL

## 2018-10-21 LAB — FERRITIN: Ferritin: 418 ng/mL — ABNORMAL HIGH (ref 24–336)

## 2018-11-01 NOTE — Progress Notes (Signed)
Ferritin elevated, sats normal at 22 and not elevated. Iron normal. Continues with anemia. Please refer to Hematology if patient willing.

## 2018-11-02 NOTE — Progress Notes (Signed)
I called pt and informed him. He seemed like he did not understand too well. I have called his sister, Otelia Sergeant, [phone]) and left Vm for a return call.

## 2018-11-04 ENCOUNTER — Other Ambulatory Visit: Payer: Self-pay | Admitting: *Deleted

## 2018-11-04 DIAGNOSIS — D649 Anemia, unspecified: Secondary | ICD-10-CM

## 2018-11-04 NOTE — Progress Notes (Signed)
PT's sister, Clarice, called and is aware. Ok to refer to Hematology. She takes him to his appts anyway.

## 2018-11-06 ENCOUNTER — Other Ambulatory Visit: Payer: Self-pay | Admitting: Physician Assistant

## 2018-11-15 ENCOUNTER — Other Ambulatory Visit (HOSPITAL_COMMUNITY)
Admission: RE | Admit: 2018-11-15 | Discharge: 2018-11-15 | Disposition: A | Discharge status: Home / Self Care | Payer: Self-pay | Source: Ambulatory Visit | Attending: Physician Assistant | Admitting: Physician Assistant

## 2018-11-15 DIAGNOSIS — Z125 Encounter for screening for malignant neoplasm of prostate: Secondary | ICD-10-CM

## 2018-11-15 DIAGNOSIS — E785 Hyperlipidemia, unspecified: Secondary | ICD-10-CM

## 2018-11-15 DIAGNOSIS — E119 Type 2 diabetes mellitus without complications: Secondary | ICD-10-CM

## 2018-11-15 DIAGNOSIS — I10 Essential (primary) hypertension: Secondary | ICD-10-CM

## 2018-11-15 LAB — COMPREHENSIVE METABOLIC PANEL
ALBUMIN: 4.1 g/dL (ref 3.5–5.0)
ALT: 26 U/L (ref 0–44)
AST: 31 U/L (ref 15–41)
Alkaline Phosphatase: 51 U/L (ref 38–126)
Anion gap: 8 (ref 5–15)
BILIRUBIN TOTAL: 0.5 mg/dL (ref 0.3–1.2)
BUN: 19 mg/dL (ref 8–23)
CHLORIDE: 108 mmol/L (ref 98–111)
CO2: 26 mmol/L (ref 22–32)
Calcium: 9.2 mg/dL (ref 8.9–10.3)
Creatinine, Ser: 1.16 mg/dL (ref 0.61–1.24)
GFR calc Af Amer: >60 mL/min (ref >60)
GFR calc non Af Amer: >60 mL/min (ref >60)
GLUCOSE: 125 mg/dL — AB (ref 70–99)
POTASSIUM: 3.4 mmol/L — AB (ref 3.5–5.1)
Sodium: 142 mmol/L (ref 135–145)
Total Protein: 7.6 g/dL (ref 6.5–8.1)

## 2018-11-15 LAB — PSA: PROSTATIC SPECIFIC ANTIGEN: 1.68 ng/mL (ref 0.00–4.00)

## 2018-11-15 LAB — HEMOGLOBIN A1C
Hgb A1c MFr Bld: 7 % — ABNORMAL HIGH (ref 4.8–5.6)
Mean Plasma Glucose: 154.2 mg/dL

## 2018-11-15 LAB — LIPID PANEL
Cholesterol: 126 mg/dL (ref 0–200)
HDL: 33 mg/dL — ABNORMAL LOW (ref >40)
LDL CALC: 69 mg/dL (ref 0–99)
Total CHOL/HDL Ratio: 3.8 RATIO
Triglycerides: 119 mg/dL (ref <150)
VLDL: 24 mg/dL (ref 0–40)

## 2018-11-17 ENCOUNTER — Inpatient Hospital Stay (HOSPITAL_COMMUNITY): Payer: Self-pay

## 2018-11-17 ENCOUNTER — Other Ambulatory Visit: Payer: Self-pay

## 2018-11-17 ENCOUNTER — Encounter (HOSPITAL_COMMUNITY): Payer: Self-pay | Admitting: Hematology

## 2018-11-17 ENCOUNTER — Inpatient Hospital Stay (HOSPITAL_COMMUNITY): Payer: Self-pay | Attending: Hematology | Admitting: Hematology

## 2018-11-17 DIAGNOSIS — D649 Anemia, unspecified: Secondary | ICD-10-CM | POA: Insufficient documentation

## 2018-11-17 DIAGNOSIS — Z79899 Other long term (current) drug therapy: Secondary | ICD-10-CM | POA: Insufficient documentation

## 2018-11-17 DIAGNOSIS — I10 Essential (primary) hypertension: Secondary | ICD-10-CM | POA: Insufficient documentation

## 2018-11-17 DIAGNOSIS — Z800 Family history of malignant neoplasm of digestive organs: Secondary | ICD-10-CM | POA: Insufficient documentation

## 2018-11-17 DIAGNOSIS — Z87891 Personal history of nicotine dependence: Secondary | ICD-10-CM | POA: Insufficient documentation

## 2018-11-17 DIAGNOSIS — E119 Type 2 diabetes mellitus without complications: Secondary | ICD-10-CM | POA: Insufficient documentation

## 2018-11-17 DIAGNOSIS — Z7984 Long term (current) use of oral hypoglycemic drugs: Secondary | ICD-10-CM | POA: Insufficient documentation

## 2018-11-17 LAB — CBC WITH DIFFERENTIAL/PLATELET
Abs Immature Granulocytes: 0.01 10*3/uL (ref 0.00–0.07)
BASOS ABS: 0 10*3/uL (ref 0.0–0.1)
Basophils Relative: 1 %
EOS PCT: 2 %
Eosinophils Absolute: 0.1 10*3/uL (ref 0.0–0.5)
HCT: 38.5 % — ABNORMAL LOW (ref 39.0–52.0)
Hemoglobin: 12.6 g/dL — ABNORMAL LOW (ref 13.0–17.0)
Immature Granulocytes: 0 %
Lymphocytes Relative: 43 %
Lymphs Abs: 1.9 10*3/uL (ref 0.7–4.0)
MCH: 31.1 pg (ref 26.0–34.0)
MCHC: 32.7 g/dL (ref 30.0–36.0)
MCV: 95.1 fL (ref 80.0–100.0)
Monocytes Absolute: 0.4 10*3/uL (ref 0.1–1.0)
Monocytes Relative: 10 %
Neutro Abs: 1.9 10*3/uL (ref 1.7–7.7)
Neutrophils Relative %: 44 %
Platelets: 229 10*3/uL (ref 150–400)
RBC: 4.05 MIL/uL — ABNORMAL LOW (ref 4.22–5.81)
RDW: 14.1 % (ref 11.5–15.5)
WBC: 4.3 10*3/uL (ref 4.0–10.5)
nRBC: 0 % (ref 0.0–0.2)

## 2018-11-17 LAB — FOLATE: Folate: 17.6 ng/mL (ref >5.9)

## 2018-11-17 LAB — RETICULOCYTES
IMMATURE RETIC FRACT: 16.9 % — AB (ref 2.3–15.9)
RBC.: 4.05 MIL/uL — ABNORMAL LOW (ref 4.22–5.81)
RETIC CT PCT: 3 % (ref 0.4–3.1)
Retic Count, Absolute: 121.1 10*3/uL (ref 19.0–186.0)

## 2018-11-17 LAB — TSH: TSH: 2.475 u[IU]/mL (ref 0.350–4.500)

## 2018-11-17 LAB — VITAMIN B12: Vitamin B-12: 367 pg/mL (ref 180–914)

## 2018-11-17 LAB — LACTATE DEHYDROGENASE: LDH: 163 U/L (ref 98–192)

## 2018-11-17 NOTE — Assessment & Plan Note (Addendum)
1.  Normocytic anemia: - EGD on 01/08/2018 shows normal esophagus, mild gastritis/duodenitis. -Colonoscopy on 01/08/2018 shows normal ileum, three 3 to 8 mm polyps in the rectum, sigmoid colon and in the ascending colon removed with hot snare.  Internal hemorrhoids and external hemorrhoids present. -CBC on 10/21/2018 shows hemoglobin 12.4 and MCV of 93.2.  Ferritin was 418 and percent saturation was 22.  Creatinine was 1.16. -Ferritin has been elevated between 350 and 420.  Patient never took iron pills in the past.  Denies any history of connective tissue disorders. - We will do work-up for anemia by repeating CBC, checking his Y60, folic acid, TSH and copper levels.  We will also check SPEP to rule out plasma cell disorders. - We will check his stool for occult blood. -We will see him back in 2 to 3 weeks to discuss the results.  We will also consider checking for hemochromatosis, as carrier state is associated with elevated ferritin levels.

## 2018-11-17 NOTE — Patient Instructions (Addendum)
[place] at [facility] Discharge Instructions   Followup  Thank you for choosing [place] at [facility] to provide your oncology and hematology care.  To afford each patient quality time with our provider, please arrive at least 15 minutes before your scheduled appointment time.   If you have a lab appointment with the [facility] please come in thru the  Main Entrance and check in at the main information desk  You need to re-schedule your appointment should you arrive 10 or more minutes late.  We strive to give you quality time with our providers, and arriving late affects you and other patients whose appointments are after yours.  Also, if you no show three or more times for appointments you may be dismissed from the clinic at the providers discretion.     Again, thank you for choosing [facility].  Our hope is that these requests will decrease the amount of time that you wait before being seen by our physicians.       _____________________________________________________________  Should you have questions after your visit to [facility], please contact our office at [phone] between the hours of 8:00 a.m. and 4:30 p.m.  Voicemails left after 4:00 p.m. will not be returned until the following business day.  For prescription refill requests, have your pharmacy contact our office and allow 72 hours.    Cancer Center Support Programs:   > Cancer Support Group  2nd Tuesday of the month 1pm-2pm, Journey Room

## 2018-11-17 NOTE — Progress Notes (Signed)
CONSULT NOTE  Patient Care Team: Soyla Dryer, PA-C as PCP - General (Physician Assistant) Danie Binder, MD as Consulting Physician (Gastroenterology)  CHIEF COMPLAINTS/PURPOSE OF CONSULTATION: Anemia  HISTORY OF PRESENTING ILLNESS:  Jack Cherry 63 y.o. male is here because of a low hemoglobin. He reports that he feels fine and has no problems or pain at home. Denies any night sweats, chills, or unexplained weight loss. Denies any nausea, vomiting, or diarrhea. Denies any new pains. Had not noticed any recent bleeding such as epistaxis, hematuria or hematochezia. Denies recent chest pain on exertion, shortness of breath on minimal exertion, pre-syncopal episodes, or palpitations. Denies any numbness or tingling in hands or feet. Denies any recent fevers, infections, or recent hospitalizations. He denies ever donating or receiving blood products. He has never taken any oral or IV iron products. Patient reports appetite at 100% and energy level at 100%.  He had an EGD in march and it was WNL and no bleeding. He stopped smoking over 12 years ago and denies any alcohol or illegal drugs. 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  He reports his mother had colon cancer and there was no other know cancer or blood disorders in his family.    MEDICAL HISTORY:  Past Medical History:  Diagnosis Date  . 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 Diabetes ([place])   . High cholesterol   . Hypertension     SURGICAL HISTORY: Past Surgical History:  Procedure Laterality Date  . COLONOSCOPY N/A 01/08/2018   Dr. Oneida Alar three 3-8 mm pol;yps, simple adenomas. internal hemorrhoids. next colonoscopy in 3 years  . ESOPHAGOGASTRODUODENOSCOPY N/A 01/08/2018   Dr. Oneida Alar: normal esophagus, mild gastritis/duodenitis, H.pylori  . HERNIA REPAIR      SOCIAL HISTORY: Social History   Socioeconomic History  . Marital  status: Single    Spouse name: Not on file  . Number of children: Not on file  . Years of education: Not on file  . Highest education level: Not on file  Occupational History  . Not on file  Social Needs  . Financial resource strain: Not very hard  . Food insecurity:    Worry: Never true    Inability: Never true  . Transportation needs:    Medical: No    Non-medical: No  Tobacco Use  . Smoking status: Never Smoker  . Smokeless tobacco: Never Used  Substance and Sexual Activity  . Alcohol use: No  . Drug use: Yes    Types: Marijuana  . Sexual activity: Never  Lifestyle  . Physical activity:    Days per week: 0 days    Minutes per session: 0 min  . Stress: Not at all  Relationships  . Social connections:    Talks on phone: Not on file    Gets together: Once a week    Attends religious service: 1 to 4 times per year    Active member of club or organization: No    Attends meetings of clubs or organizations: Never    Relationship status: Not on file  . Intimate partner violence:    Fear of current or ex partner: No    Emotionally abused: No    Physically abused: No    Forced sexual activity: No  Other Topics Concern  . Not on file  Social History Narrative  . Not on file    FAMILY  HISTORY: Family History  Problem Relation Age of Onset  . Cancer Mother        not sure what kind    ALLERGIES:  has No Known Allergies.  MEDICATIONS:  Current Outpatient Medications  Medication Sig Dispense Refill  . amLODipine (NORVASC) 10 MG tablet Take 1 tablet (10 mg total) by mouth daily. for blood pressure 90 tablet 3  . atenolol (TENORMIN) 100 MG tablet TAKE 1 TABLET BY MOUTH ONCE DAILY 30 tablet 3  . atorvastatin (LIPITOR) 20 MG tablet Take 1 tablet (20 mg total) by mouth daily. 90 tablet 3  . hydrochlorothiazide (HYDRODIURIL) 25 MG tablet Take 1 tablet (25 mg total) by mouth daily. 90 tablet 3  . loratadine (CLARITIN) 10 MG tablet Take 1 tablet (10 mg total) by mouth daily  as needed. 90 tablet 3  . losartan (COZAAR) 100 MG tablet Take 1 tablet (100 mg total) by mouth daily. 90 tablet 1  . metFORMIN (GLUCOPHAGE XR) 500 MG 24 hr tablet Take 1 tablet (500 mg total) by mouth daily with breakfast. 90 tablet 3  . Omega-3 Fatty Acids (FISH OIL) 1000 MG CAPS Take 1 capsule (1,000 mg total) by mouth daily.  0  . omeprazole (PRILOSEC) 20 MG capsule TAKE 1 Capsule BY MOUTH EVERY MORNING 30 MINUTES BEFORE BREAKFAST 120 capsule 0  . tamsulosin (FLOMAX) 0.4 MG CAPS capsule Take 1 capsule (0.4 mg total) by mouth daily. 90 capsule 1   No current facility-administered medications for this visit.     REVIEW OF SYSTEMS:   Constitutional: Denies fevers, chills or abnormal night sweats Eyes: Denies blurriness of vision, double vision or watery eyes Ears, nose, mouth, throat, and face: Denies mucositis or sore throat Respiratory: Denies cough, dyspnea or wheezes Cardiovascular: Denies palpitation, chest discomfort or lower extremity swelling Gastrointestinal:  Denies nausea, heartburn or change in bowel habits Skin: Denies abnormal skin rashes Lymphatics: Denies new lymphadenopathy or easy bruising Neurological:Denies numbness, tingling or new weaknesses Behavioral/Psych: Mood is stable, no new changes  All other systems were reviewed with the patient and are negative.  PHYSICAL EXAMINATION: ECOG PERFORMANCE STATUS: 1 - Symptomatic but completely ambulatory  Vitals:   11/17/18 1406  BP: 118/72  Pulse: 70  Temp: 98.7 F (37.1 C)   Filed Weights   11/17/18 1406  Weight: 264 lb 12.8 oz (120.1 kg)    GENERAL:alert, no distress and comfortable SKIN: skin color, texture, turgor are normal, no rashes or significant lesions EYES: normal, conjunctiva are pink and non-injected, sclera clear OROPHARYNX:no exudate, no erythema and lips, buccal mucosa, and tongue normal  NECK: supple, thyroid normal size, non-tender, without nodularity LYMPH:  no palpable lymphadenopathy in  the cervical, axillary or inguinal LUNGS: clear to auscultation and percussion with normal breathing effort HEART: regular rate & rhythm and no murmurs and no lower extremity edema ABDOMEN:abdomen soft, non-tender and normal bowel sounds Musculoskeletal:no cyanosis of digits and no clubbing  PSYCH: alert & oriented x 3 with fluent speech NEURO: no focal motor/sensory deficits  LABORATORY DATA:  I have reviewed the data as listed Recent Results (from the past 2160 hour(s))  POCT Urinalysis Dipstick     Status: Abnormal   Collection Time: 08/24/18  8:50 AM  Result Value Ref Range   Color, UA YELLOW    Clarity, UA CLEAR    Glucose, UA Negative Negative   Bilirubin, UA NEG    Ketones, UA NEG    Spec Grav, UA 1.025 1.010 - 1.025   Blood, UA  TRACE-LYCED    pH, UA 5.0 5.0 - 8.0   Protein, UA Positive (A) Negative    Comment: TRACE   Urobilinogen, UA 0.2 0.2 or 1.0 E.U./dL   Nitrite, UA NEG    Leukocytes, UA Negative Negative   Appearance     Odor    CBC with Differential/Platelet     Status: Abnormal   Collection Time: 10/21/18 10:14 AM  Result Value Ref Range   WBC 3.5 (L) 4.0 - 10.5 K/uL   RBC 4.11 (L) 4.22 - 5.81 MIL/uL   Hemoglobin 12.4 (L) 13.0 - 17.0 g/dL   HCT 38.3 (L) 39.0 - 52.0 %   MCV 93.2 80.0 - 100.0 fL   MCH 30.2 26.0 - 34.0 pg   MCHC 32.4 30.0 - 36.0 g/dL   RDW 14.1 11.5 - 15.5 %   Platelets 217 150 - 400 K/uL   nRBC 0.0 0.0 - 0.2 %   Neutrophils Relative % 48 %   Neutro Abs 1.7 1.7 - 7.7 K/uL   Lymphocytes Relative 41 %   Lymphs Abs 1.4 0.7 - 4.0 K/uL   Monocytes Relative 8 %   Monocytes Absolute 0.3 0.1 - 1.0 K/uL   Eosinophils Relative 1 %   Eosinophils Absolute 0.0 0.0 - 0.5 K/uL   Basophils Relative 1 %   Basophils Absolute 0.0 0.0 - 0.1 K/uL   Immature Granulocytes 1 %   Abs Immature Granulocytes 0.02 0.00 - 0.07 K/uL    Comment: Performed at [facility], [address], [place], [place] [address]  Ferritin     Status: Abnormal   Collection Time:  10/21/18 10:14 AM  Result Value Ref Range   Ferritin 418 (H) 24 - 336 ng/mL    Comment: Performed at [facility], [address], [place], [place] [address]  Iron and TIBC     Status: None   Collection Time: 10/21/18 10:14 AM  Result Value Ref Range   Iron 73 45 - 182 ug/dL   TIBC 333 250 - 450 ug/dL   Saturation Ratios 22 17.9 - 39.5 %   UIBC 260 ug/dL    Comment: Performed at [facility], [address], [place], [place] [address]  Lipid panel     Status: Abnormal   Collection Time: 11/15/18  8:37 AM  Result Value Ref Range   Cholesterol 126 0 - 200 mg/dL   Triglycerides 119 <150 mg/dL   HDL 33 (L) >40 mg/dL   Total CHOL/HDL Ratio 3.8 RATIO   VLDL 24 0 - 40 mg/dL   LDL Cholesterol 69 0 - 99 mg/dL    Comment:        Total Cholesterol/HDL:CHD Risk Coronary Heart Disease Risk Table                     Men   Women  1/2 Average Risk   3.4   3.3  Average Risk       5.0   4.4  2 X Average Risk   9.6   7.1  3 X Average Risk  23.4   11.0        Use the calculated Patient Ratio above and the CHD Risk Table to determine the patient's CHD Risk.        ATP III CLASSIFICATION (LDL):  <100     mg/dL   Optimal  100-129  mg/dL   Near or Above  Optimal  130-159  mg/dL   Borderline  160-189  mg/dL   High  >190     mg/dL   Very High Performed at [facility], [address] [place], [place] [address]   Comprehensive metabolic panel     Status: Abnormal   Collection Time: 11/15/18  8:38 AM  Result Value Ref Range   Sodium 142 135 - 145 mmol/L   Potassium 3.4 (L) 3.5 - 5.1 mmol/L   Chloride 108 98 - 111 mmol/L   CO2 26 22 - 32 mmol/L   Glucose, Bld 125 (H) 70 - 99 mg/dL   BUN 19 8 - 23 mg/dL   Creatinine, Ser 1.16 0.61 - 1.24 mg/dL   Calcium 9.2 8.9 - 10.3 mg/dL   Total Protein 7.6 6.5 - 8.1 g/dL   Albumin 4.1 3.5 - 5.0 g/dL   AST 31 15 - 41 U/L   ALT 26 0 - 44 U/L   Alkaline Phosphatase 51 38 - 126 U/L   Total Bilirubin 0.5 0.3 - 1.2 mg/dL   GFR calc non Af  Amer >60 >60 mL/min   GFR calc Af Amer >60 >60 mL/min   Anion gap 8 5 - 15    Comment: Performed at [facility], [address] [place], [place] [address]  Hemoglobin A1c     Status: Abnormal   Collection Time: 11/15/18  8:38 AM  Result Value Ref Range   Hgb A1c MFr Bld 7.0 (H) 4.8 - 5.6 %    Comment: (NOTE) Pre diabetes:          5.7%-6.4% Diabetes:              >6.4% Glycemic control for   <7.0% adults with diabetes    Mean Plasma Glucose 154.2 mg/dL    Comment: Performed at [facility], [place] [address], [place], [place] [address]  PSA     Status: None   Collection Time: 11/15/18  8:46 AM  Result Value Ref Range   Prostatic Specific Antigen 1.68 0.00 - 4.00 ng/mL    Comment: (NOTE) While PSA levels of <=4.0 ng/ml are reported as reference range, some men with levels below 4.0 ng/ml can have prostate cancer and many men with PSA above 4.0 ng/ml do not have prostate cancer.  Other tests such as free PSA, age specific reference ranges, PSA velocity and PSA doubling time may be helpful especially in men less than 46 years old. Performed at [facility], [place] [address], [place], [place] [address]   CBC with Differential     Status: Abnormal   Collection Time: 11/17/18  3:20 PM  Result Value Ref Range   WBC 4.3 4.0 - 10.5 K/uL   RBC 4.05 (L) 4.22 - 5.81 MIL/uL   Hemoglobin 12.6 (L) 13.0 - 17.0 g/dL   HCT 38.5 (L) 39.0 - 52.0 %   MCV 95.1 80.0 - 100.0 fL   MCH 31.1 26.0 - 34.0 pg   MCHC 32.7 30.0 - 36.0 g/dL   RDW 14.1 11.5 - 15.5 %   Platelets 229 150 - 400 K/uL   nRBC 0.0 0.0 - 0.2 %   Neutrophils Relative % 44 %   Neutro Abs 1.9 1.7 - 7.7 K/uL   Lymphocytes Relative 43 %   Lymphs Abs 1.9 0.7 - 4.0 K/uL   Monocytes Relative 10 %   Monocytes Absolute 0.4 0.1 - 1.0 K/uL   Eosinophils Relative 2 %   Eosinophils Absolute 0.1 0.0 -  0.5 K/uL   Basophils Relative 1 %   Basophils Absolute 0.0 0.0 - 0.1 K/uL   Immature Granulocytes 0 %   Abs Immature  Granulocytes 0.01 0.00 - 0.07 K/uL    Comment: Performed at [facility], [address], [place], [place] [address]  Lactate dehydrogenase     Status: None   Collection Time: 11/17/18  3:20 PM  Result Value Ref Range   LDH 163 98 - 192 U/L    Comment: Performed at [facility], [address], [place], [place] [address]  Reticulocytes     Status: Abnormal   Collection Time: 11/17/18  3:20 PM  Result Value Ref Range   Retic Ct Pct 3.0 0.4 - 3.1 %   RBC. 4.05 (L) 4.22 - 5.81 MIL/uL   Retic Count, Absolute 121.1 19.0 - 186.0 K/uL   Immature Retic Fract 16.9 (H) 2.3 - 15.9 %    Comment: Performed at [facility], [address] [place], [place] [address]    RADIOGRAPHIC STUDIES: I have personally reviewed the radiological images as listed and agreed with the findings in the report.   ASSESSMENT & PLAN:  Normocytic anemia 1.  Normocytic anemia: - EGD on 01/08/2018 shows normal esophagus, mild gastritis/duodenitis. -Colonoscopy on 01/08/2018 shows normal ileum, three 3 to 8 mm polyps in the rectum, sigmoid colon and in the ascending colon removed with hot snare.  Internal hemorrhoids and external hemorrhoids present. -CBC on 10/21/2018 shows hemoglobin 12.4 and MCV of 93.2.  Ferritin was 418 and percent saturation was 22.  Creatinine was 1.16. -Ferritin has been elevated between 350 and 420.  Patient never took iron pills in the past.  Denies any history of connective tissue disorders. - We will do work-up for anemia by repeating CBC, checking his H85, folic acid, TSH and copper levels.  We will also check SPEP to rule out plasma cell disorders. - We will check his stool for occult blood. -We will see him back in 2 to 3 weeks to discuss the results.  We will also consider checking for hemochromatosis, as carrier state is associated with elevated ferritin levels.     All questions were answered. The patient knows to call the clinic with any problems, questions or concerns.     Derek Jack, MD 11/17/18 4:09 PM

## 2018-11-18 LAB — PROTEIN ELECTROPHORESIS, SERUM
A/G Ratio: 1.3 (ref 0.7–1.7)
ALPHA-2-GLOBULIN: 0.8 g/dL (ref 0.4–1.0)
Albumin ELP: 4 g/dL (ref 2.9–4.4)
Alpha-1-Globulin: 0.2 g/dL (ref 0.0–0.4)
Beta Globulin: 0.9 g/dL (ref 0.7–1.3)
GLOBULIN, TOTAL: 3.2 g/dL (ref 2.2–3.9)
Gamma Globulin: 1.3 g/dL (ref 0.4–1.8)
Total Protein ELP: 7.2 g/dL (ref 6.0–8.5)

## 2018-11-20 LAB — COPPER, SERUM: Copper: 116 ug/dL (ref 72–166)

## 2018-11-22 ENCOUNTER — Encounter: Payer: Self-pay | Admitting: Physician Assistant

## 2018-11-22 ENCOUNTER — Ambulatory Visit: Payer: Self-pay | Admitting: Physician Assistant

## 2018-11-22 VITALS — BP 135/93 | HR 86 | Temp 97.7°F

## 2018-11-22 DIAGNOSIS — E785 Hyperlipidemia, unspecified: Secondary | ICD-10-CM

## 2018-11-22 DIAGNOSIS — I10 Essential (primary) hypertension: Secondary | ICD-10-CM

## 2018-11-22 DIAGNOSIS — Z550 Illiteracy and low-level literacy: Secondary | ICD-10-CM

## 2018-11-22 DIAGNOSIS — E119 Type 2 diabetes mellitus without complications: Secondary | ICD-10-CM

## 2018-11-22 MED ORDER — OMEPRAZOLE 20 MG PO CPDR: DELAYED_RELEASE_CAPSULE | ORAL | 3 refills | Status: DC

## 2018-11-22 MED ORDER — METFORMIN HCL ER 500 MG PO TB24: 500.0000 mg | ORAL_TABLET | Freq: Every day | ORAL | 3 refills | Status: DC

## 2018-11-22 MED ORDER — ATORVASTATIN CALCIUM 20 MG PO TABS: 20.0000 mg | ORAL_TABLET | Freq: Every day | ORAL | 3 refills | Status: DC

## 2018-11-22 MED ORDER — AMLODIPINE BESYLATE 10 MG PO TABS: 10.0000 mg | ORAL_TABLET | Freq: Every day | ORAL | 3 refills | Status: DC

## 2018-11-22 MED ORDER — HYDROCHLOROTHIAZIDE 25 MG PO TABS: 25.0000 mg | ORAL_TABLET | Freq: Every day | ORAL | 3 refills | Status: DC

## 2018-11-22 MED ORDER — LOSARTAN POTASSIUM 100 MG PO TABS: 100.0000 mg | ORAL_TABLET | Freq: Every day | ORAL | 1 refills | Status: DC

## 2018-11-22 MED ORDER — LORATADINE 10 MG PO TABS: 10.0000 mg | ORAL_TABLET | Freq: Every day | ORAL | 3 refills | Status: DC | PRN

## 2018-11-22 NOTE — Progress Notes (Signed)
BP (!) 135/93   Pulse 86   Temp 97.7 F (36.5 C)   SpO2 94%    Subjective:    Patient ID: Jack Cherry, male    DOB: 04-03-1956, 63 y.o.   MRN: [id number]  HPI: Jack Cherry is a 63 y.o. male presenting on 11/22/2018 for Diabetes; Hypertension; and Hyperlipidemia   HPI   Pt was referred to GI for + iFOBT and they referred him to hematology for anemia.  Likely anemia related to chronic disease.   Pt is feeling well today and has no complaints  Relevant past medical, surgical, family and social history reviewed and updated as indicated. Interim medical history since our last visit reviewed. Allergies and medications reviewed and updated.   CURRENT MEDS: Omeprazole 20mg  qd Fish oil 1000mg  daily Metformin xr 500mg  qd Losartan 100mg  qd hctz 25mg  qd Atenolol 100mg  qd Amlodipine 10mg  qd  Review of Systems  Constitutional: Negative for appetite change, chills, diaphoresis, fatigue, fever and unexpected weight change.  HENT: Positive for sneezing. Negative for congestion, dental problem, drooling, ear pain, facial swelling, hearing loss, mouth sores, sore throat, trouble swallowing and voice change.   Eyes: Negative for pain, discharge, redness, itching and visual disturbance.  Respiratory: Positive for cough. Negative for choking, shortness of breath and wheezing.   Cardiovascular: Negative for chest pain, palpitations and leg swelling.  Gastrointestinal: Negative for abdominal pain, blood in stool, constipation, diarrhea and vomiting.  Endocrine: Negative for cold intolerance, heat intolerance and polydipsia.  Genitourinary: Negative for decreased urine volume, dysuria and hematuria.  Musculoskeletal: Negative for arthralgias, back pain and gait problem.  Skin: Negative for rash.  Allergic/Immunologic: Negative for environmental allergies.  Neurological: Negative for seizures, syncope, light-headedness and headaches.  Hematological: Negative for adenopathy.   Psychiatric/Behavioral: Negative for agitation, dysphoric mood and suicidal ideas. The patient is not nervous/anxious.        Per HPI unless specifically indicated above     Objective:    BP (!) 135/93   Pulse 86   Temp 97.7 F (36.5 C)   SpO2 94%   Wt Readings from Last 3 Encounters:  11/17/18 264 lb 12.8 oz (120.1 kg)  08/24/18 263 lb 8 oz (119.5 kg)  06/11/18 261 lb (118.4 kg)    Physical Exam Vitals signs reviewed.  Constitutional:      Appearance: He is well-developed.  HENT:     Head: Normocephalic and atraumatic.  Neck:     Musculoskeletal: Neck supple.  Cardiovascular:     Rate and Rhythm: Normal rate and regular rhythm.  Pulmonary:     Effort: Pulmonary effort is normal.     Breath sounds: Normal breath sounds. No wheezing.  Abdominal:     General: Bowel sounds are normal.     Palpations: Abdomen is soft.     Tenderness: There is no abdominal tenderness.  Lymphadenopathy:     Cervical: No cervical adenopathy.  Skin:    General: Skin is warm and dry.  Neurological:     Mental Status: He is alert and oriented to person, place, and time.  Psychiatric:        Behavior: Behavior normal.       a1c- 7.0 LDL - 69 Trig 119 PSA 1.68 Hg 12.6       Assessment & Plan:   Encounter Diagnoses  Name Primary?  . Type 2 diabetes mellitus without complication, unspecified whether long term insulin use ([place]) Yes  . Essential hypertension, benign   . Hyperlipidemia, unspecified hyperlipidemia  type   . Unable to read or write     -reviewed labs with pt -bp a bit elevated today.  It was 118/72 last week at hem/onc appointment -no changes to medications today -pt to follow up 3 months.  RTO sooner prn

## 2018-12-06 ENCOUNTER — Other Ambulatory Visit (HOSPITAL_COMMUNITY): Payer: Self-pay

## 2018-12-06 DIAGNOSIS — D649 Anemia, unspecified: Secondary | ICD-10-CM

## 2018-12-06 LAB — OCCULT BLOOD X 1 CARD TO LAB, STOOL
Fecal Occult Bld: POSITIVE — AB
Fecal Occult Bld: POSITIVE — AB
Fecal Occult Bld: POSITIVE — AB

## 2018-12-07 ENCOUNTER — Inpatient Hospital Stay (HOSPITAL_COMMUNITY): Payer: Self-pay | Attending: Hematology | Admitting: Hematology

## 2018-12-07 ENCOUNTER — Encounter (HOSPITAL_COMMUNITY): Payer: Self-pay | Admitting: Hematology

## 2018-12-07 ENCOUNTER — Other Ambulatory Visit: Payer: Self-pay

## 2018-12-07 VITALS — BP 152/86 | HR 68 | Temp 98.6°F | Resp 18 | Wt 264.4 lb

## 2018-12-07 DIAGNOSIS — E119 Type 2 diabetes mellitus without complications: Secondary | ICD-10-CM | POA: Insufficient documentation

## 2018-12-07 DIAGNOSIS — Z79899 Other long term (current) drug therapy: Secondary | ICD-10-CM | POA: Insufficient documentation

## 2018-12-07 DIAGNOSIS — D649 Anemia, unspecified: Secondary | ICD-10-CM | POA: Insufficient documentation

## 2018-12-07 DIAGNOSIS — Z86010 Personal history of colonic polyps: Secondary | ICD-10-CM | POA: Insufficient documentation

## 2018-12-07 DIAGNOSIS — I10 Essential (primary) hypertension: Secondary | ICD-10-CM | POA: Insufficient documentation

## 2018-12-07 DIAGNOSIS — Z7984 Long term (current) use of oral hypoglycemic drugs: Secondary | ICD-10-CM | POA: Insufficient documentation

## 2018-12-07 DIAGNOSIS — E785 Hyperlipidemia, unspecified: Secondary | ICD-10-CM | POA: Insufficient documentation

## 2018-12-07 DIAGNOSIS — R195 Other fecal abnormalities: Secondary | ICD-10-CM | POA: Insufficient documentation

## 2018-12-07 DIAGNOSIS — R7989 Other specified abnormal findings of blood chemistry: Secondary | ICD-10-CM | POA: Insufficient documentation

## 2018-12-07 NOTE — Patient Instructions (Signed)
[facility] at [facility] Discharge Instructions     Thank you for choosing [facility] at [facility] to provide your oncology and hematology care.  To afford each patient quality time with our provider, please arrive at least 15 minutes before your scheduled appointment time.   If you have a lab appointment with the Cancer Center please come in thru the  Main Entrance and check in at the main information desk  You need to re-schedule your appointment should you arrive 10 or more minutes late.  We strive to give you quality time with our providers, and arriving late affects you and other patients whose appointments are after yours.  Also, if you no show three or more times for appointments you may be dismissed from the clinic at the providers discretion.     Again, thank you for choosing [facility].  Our hope is that these requests will decrease the amount of time that you wait before being seen by our physicians.       _____________________________________________________________  Should you have questions after your visit to [facility], please contact our office at [phone] between the hours of 8:00 a.m. and 4:30 p.m.  Voicemails left after 4:00 p.m. will not be returned until the following business day.  For prescription refill requests, have your pharmacy contact our office and allow 72 hours.    Cancer Center Support Programs:   > Cancer Support Group  2nd Tuesday of the month 1pm-2pm, Journey Room    

## 2018-12-07 NOTE — Assessment & Plan Note (Signed)
1.  Normocytic anemia: - EGD on 01/08/2018 shows normal esophagus, mild gastritis/duodenitis. -Colonoscopy on 01/08/2018 shows normal ileum, three 3 to 8 mm polyps in the rectum, sigmoid colon and in the ascending colon removed with hot snare.  Internal hemorrhoids and external hemorrhoids present. -CBC on 10/21/2018 shows hemoglobin 12.4 and MCV of 93.2.  Ferritin was 418 and percent saturation was 22.  Creatinine was 1.16. -Ferritin has been elevated between 350 and 420.  Patient never took iron pills in the past.  Denies any history of connective tissue disorders. - He denies any bleeding per rectum or melena. -We reviewed his blood work from most recent visit.  Hemoglobin is 12.6 with a normal MCV.  G18, folic acid, copper and LDH were normal.  SPEP was negative. -However his stool was positive for occult blood on 3 different samples.  He does not require any iron repletion at this time.  Hence I have made a referral back to GI at this time. -We will see him back in 4 months with repeat CBC.  2.  Elevated ferritin levels: - His ferritin has been high in the range of 350-420.  These levels are not suggestive of homozygous hereditary hemochromatosis although they can be suggestive of a carrier state (heterozygous). - We talked about testing for mutations.  However we are holding it off now because of the expense of the test.  He has to pay out-of-pocket.  We will plan to repeat ferritin at next visit.

## 2018-12-07 NOTE — Progress Notes (Signed)
Jack Cherry, [place] [address]   CLINIC:  Medical Oncology/Hematology  PCP:  Soyla Dryer, [address] [place] [id number] [phone]   REASON FOR VISIT: Follow-up for anemia  CURRENT THERAPY: observation    INTERVAL HISTORY:  Jack Cherry 63 y.o. male returns for routine follow-up for anemia. He here today and reports doing well since his last visit. He has no complaints at this time. Denies any nausea, vomiting, or diarrhea. Denies any new pains. Had not noticed any recent bleeding such as epistaxis, hematuria or hematochezia. Denies recent chest pain on exertion, shortness of breath on minimal exertion, pre-syncopal episodes, or palpitations. Denies any numbness or tingling in hands or feet. Denies any recent fevers, infections, or recent hospitalizations. Patient reports appetite at 100% and energy level at 100%.   REVIEW OF SYSTEMS:  Review of Systems  All other systems reviewed and are negative.    PAST MEDICAL/SURGICAL HISTORY:  Past Medical History:  Diagnosis Date  . 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 Diabetes ([place])   . High cholesterol   . Hypertension    Past Surgical History:  Procedure Laterality Date  . COLONOSCOPY N/A 01/08/2018   Dr. Oneida Alar three 3-8 mm pol;yps, simple adenomas. internal hemorrhoids. next colonoscopy in 3 years  . ESOPHAGOGASTRODUODENOSCOPY N/A 01/08/2018   Dr. Oneida Alar: normal esophagus, mild gastritis/duodenitis, H.pylori  . HERNIA REPAIR       SOCIAL HISTORY:  Social History   Socioeconomic History  . Marital status: Single    Spouse name: Not on file  . Number of children: Not on file  . Years of education: Not on file  . Highest education level: Not on file  Occupational History  . Not on file  Social Needs  . Financial resource strain: Not very hard  . Food insecurity:    Worry: Never true    Inability: Never true  .  Transportation needs:    Medical: No    Non-medical: No  Tobacco Use  . Smoking status: Never Smoker  . Smokeless tobacco: Never Used  Substance and Sexual Activity  . Alcohol use: No  . Drug use: Yes    Types: Marijuana  . Sexual activity: Never  Lifestyle  . Physical activity:    Days per week: 0 days    Minutes per session: 0 min  . Stress: Not at all  Relationships  . Social connections:    Talks on phone: Not on file    Gets together: Once a week    Attends religious service: 1 to 4 times per year    Active member of club or organization: No    Attends meetings of clubs or organizations: Never    Relationship status: Not on file  . Intimate partner violence:    Fear of current or ex partner: No    Emotionally abused: No    Physically abused: No    Forced sexual activity: No  Other Topics Concern  . Not on file  Social History Narrative  . Not on file    FAMILY HISTORY:  Family History  Problem Relation Age of Onset  . Cancer Mother        not sure what kind    CURRENT MEDICATIONS:  Outpatient Encounter Medications as of 12/07/2018  Medication Sig  . amLODipine (NORVASC) 10 MG tablet Take 1 tablet (10 mg total) by mouth daily. for blood  pressure  . atenolol (TENORMIN) 100 MG tablet TAKE 1 TABLET BY MOUTH ONCE DAILY  . atorvastatin (LIPITOR) 20 MG tablet Take 1 tablet (20 mg total) by mouth daily.  . hydrochlorothiazide (HYDRODIURIL) 25 MG tablet Take 1 tablet (25 mg total) by mouth daily.  Marland Kitchen loratadine (CLARITIN) 10 MG tablet Take 1 tablet (10 mg total) by mouth daily as needed.  Marland Kitchen losartan (COZAAR) 100 MG tablet Take 1 tablet (100 mg total) by mouth daily.  . metFORMIN (GLUCOPHAGE XR) 500 MG 24 hr tablet Take 1 tablet (500 mg total) by mouth daily with breakfast.  . Omega-3 Fatty Acids (FISH OIL) 1000 MG CAPS Take 1 capsule (1,000 mg total) by mouth daily.  Marland Kitchen omeprazole (PRILOSEC) 20 MG capsule TAKE 1 Capsule BY MOUTH EVERY MORNING 30 MINUTES BEFORE BREAKFAST    . tamsulosin (FLOMAX) 0.4 MG CAPS capsule Take 1 capsule (0.4 mg total) by mouth daily.   No facility-administered encounter medications on file as of 12/07/2018.     ALLERGIES:  No Known Allergies   PHYSICAL EXAM:  ECOG Performance status: 1  Vitals:   12/07/18 1105  BP: (!) 152/86  Pulse: 68  Resp: 18  Temp: 98.6 F (37 C)  SpO2: 97%   Filed Weights   12/07/18 1105  Weight: 264 lb 6.4 oz (119.9 kg)    Physical Exam Constitutional:      Appearance: Normal appearance. He is normal weight.  Musculoskeletal: Normal range of motion.  Skin:    General: Skin is warm and dry.  Neurological:     Mental Status: He is alert and oriented to person, place, and time. Mental status is at baseline.  Psychiatric:        Mood and Affect: Mood normal.        Behavior: Behavior normal.        Thought Content: Thought content normal.        Judgment: Judgment normal.      LABORATORY DATA:  I have reviewed the labs as listed.  CBC    Component Value Date/Time   WBC 4.3 11/17/2018 1520   RBC 4.05 (L) 11/17/2018 1520   RBC 4.05 (L) 11/17/2018 1520   HGB 12.6 (L) 11/17/2018 1520   HCT 38.5 (L) 11/17/2018 1520   PLT 229 11/17/2018 1520   MCV 95.1 11/17/2018 1520   MCH 31.1 11/17/2018 1520   MCHC 32.7 11/17/2018 1520   RDW 14.1 11/17/2018 1520   LYMPHSABS 1.9 11/17/2018 1520   MONOABS 0.4 11/17/2018 1520   EOSABS 0.1 11/17/2018 1520   BASOSABS 0.0 11/17/2018 1520   CMP Latest Ref Rng & Units 11/15/2018 06/25/2018 05/13/2018  Glucose 70 - 99 mg/dL 125(H) 112(H) 119(H)  BUN 8 - 23 mg/dL 19 18 20   Creatinine 0.61 - 1.24 mg/dL 1.16 1.12 1.17  Sodium 135 - 145 mmol/L 142 141 140  Potassium 3.5 - 5.1 mmol/L 3.4(L) 3.7 4.1  Chloride 98 - 111 mmol/L 108 109 107  CO2 22 - 32 mmol/L 26 23 26   Calcium 8.9 - 10.3 mg/dL 9.2 9.0 9.5  Total Protein 6.5 - 8.1 g/dL 7.6 - 8.0  Total Bilirubin 0.3 - 1.2 mg/dL 0.5 - 0.5  Alkaline Phos 38 - 126 U/L 51 - 52  AST 15 - 41 U/L 31 - 26  ALT 0 -  44 U/L 26 - 23       DIAGNOSTIC IMAGING:  I have independently reviewed the scans and discussed with the patient.   I have reviewed  Francene Finders, NP's note and agree with the documentation.  I personally performed a face-to-face visit, made revisions and my assessment and plan is as follows.    ASSESSMENT & PLAN:   Normocytic anemia 1.  Normocytic anemia: - EGD on 01/08/2018 shows normal esophagus, mild gastritis/duodenitis. -Colonoscopy on 01/08/2018 shows normal ileum, three 3 to 8 mm polyps in the rectum, sigmoid colon and in the ascending colon removed with hot snare.  Internal hemorrhoids and external hemorrhoids present. -CBC on 10/21/2018 shows hemoglobin 12.4 and MCV of 93.2.  Ferritin was 418 and percent saturation was 22.  Creatinine was 1.16. -Ferritin has been elevated between 350 and 420.  Patient never took iron pills in the past.  Denies any history of connective tissue disorders. - He denies any bleeding per rectum or melena. -We reviewed his blood work from most recent visit.  Hemoglobin is 12.6 with a normal MCV.  P53, folic acid, copper and LDH were normal.  SPEP was negative. -However his stool was positive for occult blood on 3 different samples.  He does not require any iron repletion at this time.  Hence I have made a referral back to GI at this time. -We will see him back in 4 months with repeat CBC.  2.  Elevated ferritin levels: - His ferritin has been high in the range of 350-420.  These levels are not suggestive of homozygous hereditary hemochromatosis although they can be suggestive of a carrier state (heterozygous). - We talked about testing for mutations.  However we are holding it off now because of the expense of the test.  He has to pay out-of-pocket.  We will plan to repeat ferritin at next visit.      Orders placed this encounter:  Orders Placed This Encounter  Procedures  . CBC with Differential/Platelet  . Ferritin  . Iron and TIBC       Derek Jack, MD [place] [phone]

## 2018-12-15 ENCOUNTER — Encounter: Payer: Self-pay | Admitting: Gastroenterology

## 2018-12-15 ENCOUNTER — Encounter: Payer: Self-pay | Admitting: *Deleted

## 2018-12-15 ENCOUNTER — Ambulatory Visit (INDEPENDENT_AMBULATORY_CARE_PROVIDER_SITE_OTHER): Payer: Self-pay | Admitting: Gastroenterology

## 2018-12-15 VITALS — BP 132/79 | HR 62 | Temp 97.0°F | Ht 73.0 in | Wt 261.8 lb

## 2018-12-15 DIAGNOSIS — D649 Anemia, unspecified: Secondary | ICD-10-CM

## 2018-12-15 NOTE — Assessment & Plan Note (Signed)
63 year old male with recent EGD/colonoscopy March 2019 with multiple adenomas, H.pylori gastritis, and documented eradication. He is followed by Hematology due to normocytic anemia and elevated ferritin. Continues with chronic anemia but stable. Recent hemoccults done by Hematology were positive on 3 occassions. Discussed capsule study with patient to wrap up the evaluation, and he is agreeable to this.  Capsule study planned Return in 3-4 months Keep follow-up with Hematology

## 2018-12-15 NOTE — Patient Instructions (Signed)
We have scheduled you for a capsule study in the near future.   I will see you in 3-4 months!  I enjoyed seeing you again today! As you know, I value our relationship and want to provide genuine, compassionate, and quality care. I welcome your feedback. If you receive a survey regarding your visit,  I greatly appreciate you taking time to fill this out. See you next time!  Annitta Needs, PhD, ANP-BC [facility]

## 2018-12-15 NOTE — Progress Notes (Signed)
CC'ED TO PCP 

## 2018-12-15 NOTE — Progress Notes (Signed)
Referring Provider: Soyla Dryer, PA-C Primary Care Physician:  Soyla Dryer, PA-C Primary GI: Dr. Oneida Alar   Chief Complaint  Patient presents with  . Anemia    HPI:   Jack Cherry is a 63 y.o. male presenting today with a history of normocytic anemia, s/p EGD/TCS with findings of simple adenomas on colonoscopy and H.pylori gastritis. He completed Pylera. Next colonoscopy due in 3 years. H.pylori eradication documented via stool test. Chronic normocytic anemia with iron normal and ferritin elevated. Referred to Hematology. As ferritin was elevated, Hematology discussed testing for carrier state for hereditary hemochromatosis but not suggestive of homozygous hemochromatosis. Hemoccult positive X 3. Hgb is remaining stable at 12.6 most recently.   Denies any abdominal pain, diarrhea, overt GI bleeding. Good appetite. GERD controlled on omeprazole.   Past Medical History:  Diagnosis Date  . 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 Diabetes ([place])   . High cholesterol   . Hypertension     Past Surgical History:  Procedure Laterality Date  . COLONOSCOPY N/A 01/08/2018   Dr. Oneida Alar three 3-8 mm pol;yps, simple adenomas. internal hemorrhoids. next colonoscopy in 3 years  . ESOPHAGOGASTRODUODENOSCOPY N/A 01/08/2018   Dr. Oneida Alar: normal esophagus, mild gastritis/duodenitis, H.pylori  . HERNIA REPAIR      Current Outpatient Medications  Medication Sig Dispense Refill  . amLODipine (NORVASC) 10 MG tablet Take 1 tablet (10 mg total) by mouth daily. for blood pressure 90 tablet 3  . atenolol (TENORMIN) 100 MG tablet TAKE 1 TABLET BY MOUTH ONCE DAILY 30 tablet 3  . atorvastatin (LIPITOR) 20 MG tablet Take 1 tablet (20 mg total) by mouth daily. 90 tablet 3  . hydrochlorothiazide (HYDRODIURIL) 25 MG tablet Take 1 tablet (25 mg total) by mouth daily. 90 tablet 3  . loratadine (CLARITIN) 10 MG tablet Take 1 tablet (10 mg total) by mouth daily as  needed. 90 tablet 3  . losartan (COZAAR) 100 MG tablet Take 1 tablet (100 mg total) by mouth daily. 90 tablet 1  . metFORMIN (GLUCOPHAGE XR) 500 MG 24 hr tablet Take 1 tablet (500 mg total) by mouth daily with breakfast. 90 tablet 3  . Omega-3 Fatty Acids (FISH OIL) 1000 MG CAPS Take 1 capsule (1,000 mg total) by mouth daily.  0  . omeprazole (PRILOSEC) 20 MG capsule TAKE 1 Capsule BY MOUTH EVERY MORNING 30 MINUTES BEFORE BREAKFAST 90 capsule 3  . tamsulosin (FLOMAX) 0.4 MG CAPS capsule Take 1 capsule (0.4 mg total) by mouth daily. (Patient not taking: Reported on 12/15/2018) 90 capsule 1   No current facility-administered medications for this visit.     Allergies as of 12/15/2018  . (No Known Allergies)    Family History  Problem Relation Age of Onset  . Cancer Mother        not sure what kind    Social History   Socioeconomic History  . Marital status: Single    Spouse name: Not on file  . Number of children: Not on file  . Years of education: Not on file  . Highest education level: Not on file  Occupational History  . Not on file  Social Needs  . Financial resource strain: Not very hard  . Food insecurity:    Worry: Never true    Inability: Never true  . Transportation needs:    Medical: No    Non-medical: No  Tobacco Use  . Smoking status: Never Smoker  .  Smokeless tobacco: Never Used  Substance and Sexual Activity  . Alcohol use: No  . Drug use: Not Currently    Types: Marijuana  . Sexual activity: Never  Lifestyle  . Physical activity:    Days per week: 0 days    Minutes per session: 0 min  . Stress: Not at all  Relationships  . Social connections:    Talks on phone: Not on file    Gets together: Once a week    Attends religious service: 1 to 4 times per year    Active member of club or organization: No    Attends meetings of clubs or organizations: Never    Relationship status: Not on file  Other Topics Concern  . Not on file  Social History  Narrative  . Not on file    Review of Systems: Gen: Denies fever, chills, anorexia. Denies fatigue, weakness, weight loss.  CV: Denies chest pain, palpitations, syncope, peripheral edema, and claudication. Resp: Denies dyspnea at rest, cough, wheezing, coughing up blood, and pleurisy. GI: see HPI Derm: Denies rash, itching, dry skin Psych: Denies depression, anxiety, memory loss, confusion. No homicidal or suicidal ideation.  Heme: Denies bruising, bleeding, and enlarged lymph nodes.  Physical Exam: BP 132/79   Pulse 62   Temp (!) 97 F (36.1 C) (Oral)   Ht 6\' 1"  (1.854 m)   Wt 261 lb 12.8 oz (118.8 kg)   BMI 34.54 kg/m  General:   Alert and oriented. No distress noted. Pleasant and cooperative.  Head:  Normocephalic and atraumatic. Eyes:  Conjuctiva clear without scleral icterus. Mouth:  Oral mucosa pink and moist.  Abdomen:  +BS, soft, non-tender and non-distended. No rebound or guarding. No HSM or masses noted. Msk:  Symmetrical without gross deformities. Normal posture. Extremities:  Without edema. Neurologic:  Alert and  oriented x4 Psych:  Alert and cooperative. Normal mood and affect.

## 2018-12-21 ENCOUNTER — Ambulatory Visit (HOSPITAL_COMMUNITY)
Admission: RE | Admit: 2018-12-21 | Discharge: 2018-12-21 | Disposition: A | Discharge status: Home / Self Care | Payer: Self-pay | Attending: Gastroenterology | Admitting: Gastroenterology

## 2018-12-21 ENCOUNTER — Encounter (HOSPITAL_COMMUNITY)
Admission: RE | Disposition: A | Discharge status: Home / Self Care | Payer: Self-pay | Source: Home / Self Care | Attending: Gastroenterology

## 2018-12-21 DIAGNOSIS — D649 Anemia, unspecified: Secondary | ICD-10-CM

## 2018-12-21 HISTORY — PX: GIVENS CAPSULE STUDY: SHX5432

## 2018-12-21 SURGERY — IMAGING PROCEDURE, GI TRACT, INTRALUMINAL, VIA CAPSULE

## 2018-12-22 ENCOUNTER — Encounter (HOSPITAL_COMMUNITY): Payer: Self-pay | Admitting: Gastroenterology

## 2018-12-31 ENCOUNTER — Telehealth: Payer: Self-pay | Admitting: Gastroenterology

## 2018-12-31 NOTE — Telephone Encounter (Signed)
PLEASE CALL PT. HIS GIVENS TEST IS NORMAL. NO SOURCE FOR HIS ANEMIA HAS BEEN IDENTIFIED. HIS HEME POSITIVE STOOLS ARE LIKELY DUE TO HEMORRHOIDS. NO ADDITIONAL WORKUP NEEDED AT THIS TIME. FOLLOW UP IN 6 MOS WITH DR. Caven Perine.

## 2018-12-31 NOTE — Procedures (Signed)
  INDICATION: NORMOCYTIC ANEMIA  PATIENT DATA: GASTRIC PASSAGE TIME: 65 m, SB PASSAGE TIME: 4H 75m  RESULTS: LIMITED views of gastric mucosa due to retained contents.  No ULCERS, masses or AVMs seen IN THE SMALL BOWEL.  LIMITED VIEWS OF THE COLON DUE TO RETAINED CONTENTS. No old blood or fresh blood in the stomach, small bowel, or colon.  DIAGNOSIS: NORMAL GIVENS STUDY  Plan: 1. FOLLOW UP WITH HEMATOLOGY TO CONSIDER PRN IV FE 2. OPV IN 6 MOS W/ RGA

## 2019-01-03 NOTE — Telephone Encounter (Signed)
PT's sister, [name] is aware.

## 2019-01-03 NOTE — Telephone Encounter (Signed)
ON RECALL  °

## 2019-01-18 ENCOUNTER — Other Ambulatory Visit: Payer: Self-pay | Admitting: Physician Assistant

## 2019-01-18 MED ORDER — HYDROCHLOROTHIAZIDE 25 MG PO TABS: 25.0000 mg | ORAL_TABLET | Freq: Every day | ORAL | 3 refills | Status: DC

## 2019-01-18 MED ORDER — LORATADINE 10 MG PO TABS: 10.0000 mg | ORAL_TABLET | Freq: Every day | ORAL | 3 refills | Status: DC | PRN

## 2019-01-18 MED ORDER — METFORMIN HCL ER 500 MG PO TB24: 500.0000 mg | ORAL_TABLET | Freq: Every day | ORAL | 3 refills | Status: DC

## 2019-01-18 MED ORDER — ATORVASTATIN CALCIUM 20 MG PO TABS: 20.0000 mg | ORAL_TABLET | Freq: Every day | ORAL | 3 refills | Status: DC

## 2019-01-18 MED ORDER — OMEPRAZOLE 20 MG PO CPDR: DELAYED_RELEASE_CAPSULE | ORAL | 3 refills | Status: DC

## 2019-01-18 MED ORDER — AMLODIPINE BESYLATE 10 MG PO TABS: 10.0000 mg | ORAL_TABLET | Freq: Every day | ORAL | 3 refills | Status: DC

## 2019-01-18 MED ORDER — LOSARTAN POTASSIUM 100 MG PO TABS: 100.0000 mg | ORAL_TABLET | Freq: Every day | ORAL | 1 refills | Status: DC

## 2019-02-21 ENCOUNTER — Ambulatory Visit: Payer: Self-pay | Admitting: Physician Assistant

## 2019-03-16 ENCOUNTER — Ambulatory Visit: Payer: Self-pay | Admitting: Gastroenterology

## 2019-04-05 ENCOUNTER — Other Ambulatory Visit (HOSPITAL_COMMUNITY): Payer: Self-pay

## 2019-04-05 ENCOUNTER — Other Ambulatory Visit: Payer: Self-pay | Admitting: Physician Assistant

## 2019-04-12 ENCOUNTER — Ambulatory Visit (HOSPITAL_COMMUNITY): Payer: Self-pay | Admitting: Hematology

## 2019-04-18 ENCOUNTER — Other Ambulatory Visit (HOSPITAL_COMMUNITY)
Admission: RE | Admit: 2019-04-18 | Discharge: 2019-04-18 | Disposition: A | Discharge status: Home / Self Care | Payer: Self-pay | Source: Ambulatory Visit | Attending: Physician Assistant | Admitting: Physician Assistant

## 2019-04-18 ENCOUNTER — Ambulatory Visit: Payer: Self-pay | Admitting: Physician Assistant

## 2019-04-18 ENCOUNTER — Encounter: Payer: Self-pay | Admitting: Physician Assistant

## 2019-04-18 ENCOUNTER — Other Ambulatory Visit: Payer: Self-pay

## 2019-04-18 VITALS — BP 166/98 | HR 101 | Temp 97.9°F | Wt 260.1 lb

## 2019-04-18 DIAGNOSIS — I10 Essential (primary) hypertension: Secondary | ICD-10-CM

## 2019-04-18 DIAGNOSIS — Z9119 Patient's noncompliance with other medical treatment and regimen: Secondary | ICD-10-CM

## 2019-04-18 DIAGNOSIS — E119 Type 2 diabetes mellitus without complications: Secondary | ICD-10-CM

## 2019-04-18 DIAGNOSIS — Z91199 Patient's noncompliance with other medical treatment and regimen due to unspecified reason: Secondary | ICD-10-CM

## 2019-04-18 DIAGNOSIS — Z550 Illiteracy and low-level literacy: Secondary | ICD-10-CM

## 2019-04-18 DIAGNOSIS — E785 Hyperlipidemia, unspecified: Secondary | ICD-10-CM

## 2019-04-18 LAB — COMPREHENSIVE METABOLIC PANEL
ALT: 25 U/L (ref 0–44)
AST: 29 U/L (ref 15–41)
Albumin: 4.1 g/dL (ref 3.5–5.0)
Alkaline Phosphatase: 54 U/L (ref 38–126)
Anion gap: 13 (ref 5–15)
BUN: 22 mg/dL (ref 8–23)
CO2: 23 mmol/L (ref 22–32)
Calcium: 9.7 mg/dL (ref 8.9–10.3)
Chloride: 105 mmol/L (ref 98–111)
Creatinine, Ser: 1.15 mg/dL (ref 0.61–1.24)
GFR calc Af Amer: >60 mL/min (ref >60)
GFR calc non Af Amer: >60 mL/min (ref >60)
Glucose, Bld: 115 mg/dL — ABNORMAL HIGH (ref 70–99)
Potassium: 3.9 mmol/L (ref 3.5–5.1)
Sodium: 141 mmol/L (ref 135–145)
Total Bilirubin: 0.4 mg/dL (ref 0.3–1.2)
Total Protein: 8 g/dL (ref 6.5–8.1)

## 2019-04-18 LAB — LIPID PANEL
Cholesterol: 199 mg/dL (ref 0–200)
HDL: 34 mg/dL — ABNORMAL LOW (ref >40)
LDL Cholesterol: 138 mg/dL — ABNORMAL HIGH (ref 0–99)
Total CHOL/HDL Ratio: 5.9 RATIO
Triglycerides: 136 mg/dL (ref <150)
VLDL: 27 mg/dL (ref 0–40)

## 2019-04-18 LAB — HEMOGLOBIN A1C
Hgb A1c MFr Bld: 6.7 % — ABNORMAL HIGH (ref 4.8–5.6)
Mean Plasma Glucose: 145.59 mg/dL

## 2019-04-18 NOTE — Progress Notes (Signed)
BP (!) 166/98   Pulse (!) 101   Temp 97.9 F (36.6 C)   Wt 260 lb 1.6 oz (118 kg)   SpO2 97%   BMI 34.32 kg/m    Subjective:    Patient ID: Jack Cherry, male    DOB: Nov 03, 1956, 63 y.o.   MRN: [id number]  HPI: Jack Cherry is a 63 y.o. male presenting on 04/18/2019 for Diabetes, Hyperlipidemia, and Hypertension   HPI   Pt had negative screeningquestionnairefor CV19   Pt did not get labs drawn.   He is out of all of his medications.  Pt is not wearing a mask when he is out in the community.    Pt has no complaints and denies fever, sob, cp.    Relevant past medical, surgical, family and social history reviewed and updated as indicated. Interim medical history since our last visit reviewed. Allergies and medications reviewed and updated.   Current Outpatient Medications:  .  loratadine (CLARITIN) 10 MG tablet, Take 1 tablet (10 mg total) by mouth daily as needed., Disp: 90 tablet, Rfl: 3 .  Omega-3 Fatty Acids (FISH OIL) 1000 MG CAPS, Take 1 capsule (1,000 mg total) by mouth daily., Disp: , Rfl: 0 .  amLODipine (NORVASC) 10 MG tablet, Take 1 tablet (10 mg total) by mouth daily. for blood pressure (Patient not taking: Reported on 04/18/2019), Disp: 90 tablet, Rfl: 3 .  atenolol (TENORMIN) 100 MG tablet, Take 1 tablet by mouth once daily (Patient not taking: Reported on 04/18/2019), Disp: 30 tablet, Rfl: 3 .  atorvastatin (LIPITOR) 20 MG tablet, Take 1 tablet (20 mg total) by mouth daily. (Patient not taking: Reported on 04/18/2019), Disp: 90 tablet, Rfl: 3 .  hydrochlorothiazide (HYDRODIURIL) 25 MG tablet, Take 1 tablet (25 mg total) by mouth daily. (Patient not taking: Reported on 04/18/2019), Disp: 90 tablet, Rfl: 3 .  losartan (COZAAR) 100 MG tablet, Take 1 tablet (100 mg total) by mouth daily. (Patient not taking: Reported on 04/18/2019), Disp: 90 tablet, Rfl: 1 .  metFORMIN (GLUCOPHAGE XR) 500 MG 24 hr tablet, Take 1 tablet (500 mg total) by mouth daily with breakfast.  (Patient not taking: Reported on 04/18/2019), Disp: 90 tablet, Rfl: 3 .  omeprazole (PRILOSEC) 20 MG capsule, TAKE 1 Capsule BY MOUTH EVERY MORNING 30 MINUTES BEFORE BREAKFAST (Patient not taking: Reported on 04/18/2019), Disp: 90 capsule, Rfl: 3     Review of Systems  Per HPI unless specifically indicated above     Objective:    BP (!) 166/98   Pulse (!) 101   Temp 97.9 F (36.6 C)   Wt 260 lb 1.6 oz (118 kg)   SpO2 97%   BMI 34.32 kg/m   Wt Readings from Last 3 Encounters:  04/18/19 260 lb 1.6 oz (118 kg)  12/21/18 261 lb 12.8 oz (118.8 kg)  12/15/18 261 lb 12.8 oz (118.8 kg)    Physical Exam Vitals signs reviewed.  Constitutional:      Appearance: He is well-developed.  HENT:     Head: Normocephalic and atraumatic.  Neck:     Musculoskeletal: Neck supple.  Cardiovascular:     Rate and Rhythm: Normal rate and regular rhythm.  Pulmonary:     Effort: Pulmonary effort is normal.     Breath sounds: Normal breath sounds. No wheezing.  Abdominal:     General: Bowel sounds are normal.     Palpations: Abdomen is soft.     Tenderness: There is no abdominal tenderness.  Lymphadenopathy:  Cervical: No cervical adenopathy.  Skin:    General: Skin is warm and dry.  Neurological:     Mental Status: He is alert and oriented to person, place, and time.  Psychiatric:        Behavior: Behavior normal.           Assessment & Plan:    Encounter Diagnoses  Name Primary?  . Type 2 diabetes mellitus without complication, unspecified whether long term insulin use ([place]) Yes  . Essential hypertension, benign   . Hyperlipidemia, unspecified hyperlipidemia type   . 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 Personal history of noncompliance with medical treatment, presenting hazards to health       -Pt counseled to avoid running out of his medications -pt counseled to get his labs drawn -pt encouraged to wear a mask when in public per CDC guidelines -pt to follow up 3 months.  He  is to contact office sooner prn

## 2019-05-31 ENCOUNTER — Encounter: Payer: Self-pay | Admitting: Gastroenterology

## 2019-07-19 ENCOUNTER — Ambulatory Visit: Payer: Self-pay | Admitting: Physician Assistant

## 2019-07-19 ENCOUNTER — Other Ambulatory Visit: Payer: Self-pay

## 2019-07-19 ENCOUNTER — Encounter: Payer: Self-pay | Admitting: Physician Assistant

## 2019-07-19 VITALS — BP 110/76 | HR 67 | Temp 97.7°F | Wt 260.6 lb

## 2019-07-19 DIAGNOSIS — E669 Obesity, unspecified: Secondary | ICD-10-CM

## 2019-07-19 DIAGNOSIS — Z125 Encounter for screening for malignant neoplasm of prostate: Secondary | ICD-10-CM

## 2019-07-19 DIAGNOSIS — Z550 Illiteracy and low-level literacy: Secondary | ICD-10-CM

## 2019-07-19 DIAGNOSIS — E785 Hyperlipidemia, unspecified: Secondary | ICD-10-CM

## 2019-07-19 DIAGNOSIS — I10 Essential (primary) hypertension: Secondary | ICD-10-CM

## 2019-07-19 DIAGNOSIS — E119 Type 2 diabetes mellitus without complications: Secondary | ICD-10-CM

## 2019-07-19 DIAGNOSIS — D649 Anemia, unspecified: Secondary | ICD-10-CM

## 2019-07-19 NOTE — Progress Notes (Signed)
BP 110/76   Pulse 67   Temp 97.7 F (36.5 C)   Wt 260 lb 9.6 oz (118.2 kg)   SpO2 94%   BMI 34.38 kg/m    Subjective:    Patient ID: Jack Cherry, male    DOB: Mar 10, 1956, 63 y.o.   MRN: [id number]  HPI: Jack Cherry is a 63 y.o. male presenting on 07/19/2019 for Diabetes, Hypertension, and Hyperlipidemia   HPI   Pt had a negative covid 19 screening questionnaire   Pt is back on all his meds  He is doing well and has no complaints.  He specifically denies CP, SOB, abdominal pain  Echo oct 2018- ef 0000000, grade 1 diastolic dysfunction, trivial MR, mod calcified aortic valve with mild AS. Trivial TR.      Relevant past medical, surgical, family and social history reviewed and updated as indicated. Interim medical history since our last visit reviewed. Allergies and medications reviewed and updated.   Current Outpatient Medications:  .  amLODipine (NORVASC) 10 MG tablet, Take 1 tablet (10 mg total) by mouth daily. for blood pressure, Disp: 90 tablet, Rfl: 3 .  atenolol (TENORMIN) 100 MG tablet, Take 1 tablet by mouth once daily, Disp: 30 tablet, Rfl: 3 .  atorvastatin (LIPITOR) 20 MG tablet, Take 1 tablet (20 mg total) by mouth daily., Disp: 90 tablet, Rfl: 3 .  hydrochlorothiazide (HYDRODIURIL) 25 MG tablet, Take 1 tablet (25 mg total) by mouth daily., Disp: 90 tablet, Rfl: 3 .  loratadine (CLARITIN) 10 MG tablet, Take 1 tablet (10 mg total) by mouth daily as needed., Disp: 90 tablet, Rfl: 3 .  losartan (COZAAR) 100 MG tablet, Take 1 tablet (100 mg total) by mouth daily., Disp: 90 tablet, Rfl: 1 .  metFORMIN (GLUCOPHAGE XR) 500 MG 24 hr tablet, Take 1 tablet (500 mg total) by mouth daily with breakfast., Disp: 90 tablet, Rfl: 3 .  Omega-3 Fatty Acids (FISH OIL) 1000 MG CAPS, Take 1 capsule (1,000 mg total) by mouth daily., Disp: , Rfl: 0 .  omeprazole (PRILOSEC) 20 MG capsule, TAKE 1 Capsule BY MOUTH EVERY MORNING 30 MINUTES BEFORE BREAKFAST, Disp: 90 capsule, Rfl:  3    Review of Systems  Per HPI unless specifically indicated above     Objective:    BP 110/76   Pulse 67   Temp 97.7 F (36.5 C)   Wt 260 lb 9.6 oz (118.2 kg)   SpO2 94%   BMI 34.38 kg/m   Wt Readings from Last 3 Encounters:  07/19/19 260 lb 9.6 oz (118.2 kg)  04/18/19 260 lb 1.6 oz (118 kg)  12/21/18 261 lb 12.8 oz (118.8 kg)    Physical Exam Vitals signs reviewed.  Constitutional:      General: He is not in acute distress.    Appearance: Normal appearance. He is well-developed. He is obese. He is not ill-appearing.  HENT:     Head: Normocephalic and atraumatic.  Neck:     Musculoskeletal: Neck supple.  Cardiovascular:     Rate and Rhythm: Normal rate and regular rhythm.     Heart sounds: Murmur present.  Pulmonary:     Effort: Pulmonary effort is normal.     Breath sounds: Normal breath sounds. No wheezing.  Abdominal:     General: Bowel sounds are normal.     Palpations: Abdomen is soft.     Tenderness: There is no abdominal tenderness.  Lymphadenopathy:     Cervical: No cervical adenopathy.  Skin:  General: Skin is warm and dry.  Neurological:     Mental Status: He is alert and oriented to person, place, and time.  Psychiatric:        Attention and Perception: Attention normal.        Speech: Speech normal.        Behavior: Behavior normal. Behavior is cooperative.           Assessment & Plan:    Encounter Diagnoses  Name Primary?  . Type 2 diabetes mellitus without complication, unspecified whether long term insulin use ([place]) Yes  . Essential hypertension, benign   . Hyperlipidemia, unspecified hyperlipidemia type   . Obesity, unspecified classification, unspecified obesity type, unspecified whether serious comorbidity present   . Unable to read or write      -Appointment was made for pt to get a flu shot  -Will Refer for annual DM eye exam  -pt to continue current medications  -pt is encouraged to continue to wear a mask when in  public  -pt to follow up in 3 months.  He is to contact office sooner prn

## 2019-07-25 ENCOUNTER — Other Ambulatory Visit: Payer: Self-pay | Admitting: Physician Assistant

## 2019-07-25 DIAGNOSIS — D649 Anemia, unspecified: Secondary | ICD-10-CM

## 2019-08-21 ENCOUNTER — Other Ambulatory Visit: Payer: Self-pay | Admitting: Physician Assistant

## 2019-08-30 ENCOUNTER — Other Ambulatory Visit: Payer: Self-pay | Admitting: Physician Assistant

## 2019-08-30 MED ORDER — METOPROLOL SUCCINATE ER 100 MG PO TB24: 100.0000 mg | ORAL_TABLET | Freq: Every day | ORAL | 3 refills | Status: DC

## 2019-10-12 ENCOUNTER — Other Ambulatory Visit: Payer: Self-pay | Admitting: Physician Assistant

## 2019-10-17 ENCOUNTER — Other Ambulatory Visit (HOSPITAL_COMMUNITY)
Admission: RE | Admit: 2019-10-17 | Discharge: 2019-10-17 | Disposition: A | Discharge status: Home / Self Care | Payer: Self-pay | Source: Ambulatory Visit | Attending: Physician Assistant | Admitting: Physician Assistant

## 2019-10-17 DIAGNOSIS — E119 Type 2 diabetes mellitus without complications: Secondary | ICD-10-CM | POA: Insufficient documentation

## 2019-10-17 DIAGNOSIS — Z125 Encounter for screening for malignant neoplasm of prostate: Secondary | ICD-10-CM | POA: Insufficient documentation

## 2019-10-17 DIAGNOSIS — D649 Anemia, unspecified: Secondary | ICD-10-CM | POA: Insufficient documentation

## 2019-10-17 DIAGNOSIS — I10 Essential (primary) hypertension: Secondary | ICD-10-CM | POA: Insufficient documentation

## 2019-10-17 DIAGNOSIS — E785 Hyperlipidemia, unspecified: Secondary | ICD-10-CM | POA: Insufficient documentation

## 2019-10-17 LAB — LIPID PANEL
Cholesterol: 141 mg/dL (ref 0–200)
HDL: 30 mg/dL — ABNORMAL LOW (ref >40)
LDL Cholesterol: 91 mg/dL (ref 0–99)
Total CHOL/HDL Ratio: 4.7 RATIO
Triglycerides: 98 mg/dL (ref <150)
VLDL: 20 mg/dL (ref 0–40)

## 2019-10-17 LAB — COMPREHENSIVE METABOLIC PANEL
ALT: 19 U/L (ref 0–44)
AST: 21 U/L (ref 15–41)
Albumin: 4.1 g/dL (ref 3.5–5.0)
Alkaline Phosphatase: 56 U/L (ref 38–126)
Anion gap: 11 (ref 5–15)
BUN: 25 mg/dL — ABNORMAL HIGH (ref 8–23)
CO2: 26 mmol/L (ref 22–32)
Calcium: 9.9 mg/dL (ref 8.9–10.3)
Chloride: 105 mmol/L (ref 98–111)
Creatinine, Ser: 1.21 mg/dL (ref 0.61–1.24)
GFR calc Af Amer: >60 mL/min (ref >60)
GFR calc non Af Amer: >60 mL/min (ref >60)
Glucose, Bld: 122 mg/dL — ABNORMAL HIGH (ref 70–99)
Potassium: 4.2 mmol/L (ref 3.5–5.1)
Sodium: 142 mmol/L (ref 135–145)
Total Bilirubin: 0.5 mg/dL (ref 0.3–1.2)
Total Protein: 7.9 g/dL (ref 6.5–8.1)

## 2019-10-17 LAB — CBC
HCT: 37.7 % — ABNORMAL LOW (ref 39.0–52.0)
Hemoglobin: 11.9 g/dL — ABNORMAL LOW (ref 13.0–17.0)
MCH: 30.6 pg (ref 26.0–34.0)
MCHC: 31.6 g/dL (ref 30.0–36.0)
MCV: 96.9 fL (ref 80.0–100.0)
Platelets: 217 10*3/uL (ref 150–400)
RBC: 3.89 MIL/uL — ABNORMAL LOW (ref 4.22–5.81)
RDW: 14.2 % (ref 11.5–15.5)
WBC: 3.3 10*3/uL — ABNORMAL LOW (ref 4.0–10.5)
nRBC: 0 % (ref 0.0–0.2)

## 2019-10-17 LAB — PSA: Prostatic Specific Antigen: 2.76 ng/mL (ref 0.00–4.00)

## 2019-10-17 LAB — HEMOGLOBIN A1C
Hgb A1c MFr Bld: 6.9 % — ABNORMAL HIGH (ref 4.8–5.6)
Mean Plasma Glucose: 151.33 mg/dL

## 2019-10-18 LAB — MICROALBUMIN, URINE: Microalb, Ur: 53.7 ug/mL — ABNORMAL HIGH

## 2019-10-19 ENCOUNTER — Other Ambulatory Visit: Payer: Self-pay

## 2019-10-19 ENCOUNTER — Ambulatory Visit: Payer: Self-pay | Admitting: Physician Assistant

## 2019-10-19 ENCOUNTER — Encounter: Payer: Self-pay | Admitting: Physician Assistant

## 2019-10-19 VITALS — BP 156/104 | HR 94 | Temp 98.2°F

## 2019-10-19 DIAGNOSIS — I10 Essential (primary) hypertension: Secondary | ICD-10-CM

## 2019-10-19 DIAGNOSIS — E785 Hyperlipidemia, unspecified: Secondary | ICD-10-CM

## 2019-10-19 DIAGNOSIS — E119 Type 2 diabetes mellitus without complications: Secondary | ICD-10-CM

## 2019-10-19 DIAGNOSIS — D649 Anemia, unspecified: Secondary | ICD-10-CM

## 2019-10-19 DIAGNOSIS — Z550 Illiteracy and low-level literacy: Secondary | ICD-10-CM

## 2019-10-19 NOTE — Patient Instructions (Signed)
Over the counter IRON to help anemia (low blood)

## 2019-10-19 NOTE — Progress Notes (Signed)
BP (!) 156/104   Pulse 94   Temp 98.2 F (36.8 C)   SpO2 96%    Subjective:    Patient ID: Jack Cherry, male    DOB: 1955-12-03, 63 y.o.   MRN: [id number]  HPI: Latrail Qamar is a 63 y.o. male presenting on 10/19/2019 for Diabetes, Hypertension (pt states he just got his medications from [facility] and had been out of 4 medications for about 4 days. pt states he has already taken medications today.), and Hyperlipidemia   HPI   Pt had a negative covid 62 screening questionnaire   63yo M with DM, htn, dyslipidemia. Pt doing well and has no complaints today  Relevant past medical, surgical, family and social history reviewed and updated as indicated. Interim medical history since our last visit reviewed. Allergies and medications reviewed and updated.   Current Outpatient Medications:  .  amLODipine (NORVASC) 10 MG tablet, Take 1 tablet (10 mg total) by mouth daily. for blood pressure, Disp: 90 tablet, Rfl: 3 .  atorvastatin (LIPITOR) 20 MG tablet, Take 1 tablet (20 mg total) by mouth daily., Disp: 90 tablet, Rfl: 3 .  hydrochlorothiazide (HYDRODIURIL) 25 MG tablet, Take 1 tablet (25 mg total) by mouth daily., Disp: 90 tablet, Rfl: 3 .  loratadine (CLARITIN) 10 MG tablet, Take 1 tablet (10 mg total) by mouth daily as needed., Disp: 90 tablet, Rfl: 3 .  losartan (COZAAR) 100 MG tablet, TAKE 1 Tablet BY MOUTH EVERY DAY, Disp: 90 tablet, Rfl: 1 .  metFORMIN (GLUCOPHAGE XR) 500 MG 24 hr tablet, Take 1 tablet (500 mg total) by mouth daily with breakfast., Disp: 90 tablet, Rfl: 3 .  metoprolol succinate (TOPROL-XL) 100 MG 24 hr tablet, Take 1 tablet (100 mg total) by mouth daily. Take with or immediately following a meal., Disp: 30 tablet, Rfl: 3 .  Omega-3 Fatty Acids (FISH OIL) 1000 MG CAPS, Take 1 capsule (1,000 mg total) by mouth daily., Disp: , Rfl: 0 .  omeprazole (PRILOSEC) 20 MG capsule, TAKE 1 Capsule BY MOUTH EVERY MORNING 30 MINUTES BEFORE BREAKFAST, Disp: 90 capsule, Rfl:  3    Review of Systems  Per HPI unless specifically indicated above     Objective:    BP (!) 156/104   Pulse 94   Temp 98.2 F (36.8 C)   SpO2 96%   Wt Readings from Last 3 Encounters:  07/19/19 260 lb 9.6 oz (118.2 kg)  04/18/19 260 lb 1.6 oz (118 kg)  12/21/18 261 lb 12.8 oz (118.8 kg)    Physical Exam Vitals reviewed.  Constitutional:      General: He is not in acute distress.    Appearance: Normal appearance. He is well-developed. He is not ill-appearing.  HENT:     Head: Normocephalic and atraumatic.  Cardiovascular:     Rate and Rhythm: Normal rate and regular rhythm.  Pulmonary:     Effort: Pulmonary effort is normal.     Breath sounds: Normal breath sounds. No wheezing.  Abdominal:     General: Bowel sounds are normal.     Palpations: Abdomen is soft.     Tenderness: There is no abdominal tenderness.  Musculoskeletal:     Cervical back: Neck supple.     Right lower leg: No edema.     Left lower leg: No edema.  Lymphadenopathy:     Cervical: No cervical adenopathy.  Skin:    General: Skin is warm and dry.  Neurological:     Mental Status: He  is alert and oriented to person, place, and time.  Psychiatric:        Behavior: Behavior normal. Behavior is cooperative.     Results for orders placed or performed during the hospital encounter of 10/17/19  Hemoglobin A1c  Result Value Ref Range   Hgb A1c MFr Bld 6.9 (H) 4.8 - 5.6 %   Mean Plasma Glucose 151.33 mg/dL  CBC  Result Value Ref Range   WBC 3.3 (L) 4.0 - 10.5 K/uL   RBC 3.89 (L) 4.22 - 5.81 MIL/uL   Hemoglobin 11.9 (L) 13.0 - 17.0 g/dL   HCT 37.7 (L) 39.0 - 52.0 %   MCV 96.9 80.0 - 100.0 fL   MCH 30.6 26.0 - 34.0 pg   MCHC 31.6 30.0 - 36.0 g/dL   RDW 14.2 11.5 - 15.5 %   Platelets 217 150 - 400 K/uL   nRBC 0.0 0.0 - 0.2 %  Microalbumin, urine  Result Value Ref Range   Microalb, Ur 53.7 (H) Not Estab. ug/mL  PSA  Result Value Ref Range   Prostatic Specific Antigen 2.76 0.00 - 4.00 ng/mL   Lipid panel  Result Value Ref Range   Cholesterol 141 0 - 200 mg/dL   Triglycerides 98 <150 mg/dL   HDL 30 (L) >40 mg/dL   Total CHOL/HDL Ratio 4.7 RATIO   VLDL 20 0 - 40 mg/dL   LDL Cholesterol 91 0 - 99 mg/dL  Comprehensive metabolic panel  Result Value Ref Range   Sodium 142 135 - 145 mmol/L   Potassium 4.2 3.5 - 5.1 mmol/L   Chloride 105 98 - 111 mmol/L   CO2 26 22 - 32 mmol/L   Glucose, Bld 122 (H) 70 - 99 mg/dL   BUN 25 (H) 8 - 23 mg/dL   Creatinine, Ser 1.21 0.61 - 1.24 mg/dL   Calcium 9.9 8.9 - 10.3 mg/dL   Total Protein 7.9 6.5 - 8.1 g/dL   Albumin 4.1 3.5 - 5.0 g/dL   AST 21 15 - 41 U/L   ALT 19 0 - 44 U/L   Alkaline Phosphatase 56 38 - 126 U/L   Total Bilirubin 0.5 0.3 - 1.2 mg/dL   GFR calc non Af Amer >60 >60 mL/min   GFR calc Af Amer >60 >60 mL/min   Anion gap 11 5 - 15      Assessment & Plan:    Encounter Diagnoses  Name Primary?  . Type 2 diabetes mellitus without complication, unspecified whether long term insulin use ([place]) Yes  . Essential hypertension, benign   . Hyperlipidemia, unspecified hyperlipidemia type   . Anemia, unspecified type   . Unable to read or write      -Reviewed labs with pt -Pt to continue current meds -Pt is counseled to avoid running out of his meds.  Discussed with pt that bp likely elevated today due to being out of his meds -Pt to follow up 3 months.  He is to contact office sooner prn

## 2019-10-24 ENCOUNTER — Ambulatory Visit: Payer: Self-pay | Admitting: Physician Assistant

## 2019-11-28 ENCOUNTER — Other Ambulatory Visit: Payer: Self-pay

## 2019-11-28 ENCOUNTER — Inpatient Hospital Stay (HOSPITAL_COMMUNITY)
Admission: EM | Admit: 2019-11-28 | Discharge: 2019-11-30 | DRG: 177 | Disposition: A | Discharge status: Home / Self Care | Payer: HRSA Program | Attending: Family Medicine | Admitting: Family Medicine

## 2019-11-28 ENCOUNTER — Encounter (HOSPITAL_COMMUNITY): Payer: Self-pay | Admitting: Emergency Medicine

## 2019-11-28 ENCOUNTER — Emergency Department (HOSPITAL_COMMUNITY): Payer: HRSA Program

## 2019-11-28 DIAGNOSIS — Z809 Family history of malignant neoplasm, unspecified: Secondary | ICD-10-CM

## 2019-11-28 DIAGNOSIS — E860 Dehydration: Secondary | ICD-10-CM | POA: Diagnosis present

## 2019-11-28 DIAGNOSIS — R0902 Hypoxemia: Secondary | ICD-10-CM

## 2019-11-28 DIAGNOSIS — Z20822 Contact with and (suspected) exposure to covid-19: Secondary | ICD-10-CM

## 2019-11-28 DIAGNOSIS — J9600 Acute respiratory failure, unspecified whether with hypoxia or hypercapnia: Secondary | ICD-10-CM | POA: Diagnosis present

## 2019-11-28 DIAGNOSIS — Z7984 Long term (current) use of oral hypoglycemic drugs: Secondary | ICD-10-CM | POA: Diagnosis not present

## 2019-11-28 DIAGNOSIS — N179 Acute kidney failure, unspecified: Secondary | ICD-10-CM | POA: Diagnosis present

## 2019-11-28 DIAGNOSIS — E7800 Pure hypercholesterolemia, unspecified: Secondary | ICD-10-CM | POA: Diagnosis present

## 2019-11-28 DIAGNOSIS — J9601 Acute respiratory failure with hypoxia: Secondary | ICD-10-CM | POA: Diagnosis present

## 2019-11-28 DIAGNOSIS — I10 Essential (primary) hypertension: Secondary | ICD-10-CM | POA: Diagnosis present

## 2019-11-28 DIAGNOSIS — U071 COVID-19: Principal | ICD-10-CM | POA: Diagnosis present

## 2019-11-28 DIAGNOSIS — R0602 Shortness of breath: Secondary | ICD-10-CM | POA: Diagnosis present

## 2019-11-28 DIAGNOSIS — E785 Hyperlipidemia, unspecified: Secondary | ICD-10-CM | POA: Diagnosis present

## 2019-11-28 DIAGNOSIS — J069 Acute upper respiratory infection, unspecified: Secondary | ICD-10-CM | POA: Diagnosis present

## 2019-11-28 DIAGNOSIS — E119 Type 2 diabetes mellitus without complications: Secondary | ICD-10-CM | POA: Diagnosis present

## 2019-11-28 DIAGNOSIS — T380X5A Adverse effect of glucocorticoids and synthetic analogues, initial encounter: Secondary | ICD-10-CM | POA: Diagnosis not present

## 2019-11-28 DIAGNOSIS — E1165 Type 2 diabetes mellitus with hyperglycemia: Secondary | ICD-10-CM

## 2019-11-28 LAB — COMPREHENSIVE METABOLIC PANEL
ALT: 33 U/L (ref 0–44)
AST: 63 U/L — ABNORMAL HIGH (ref 15–41)
Albumin: 3.2 g/dL — ABNORMAL LOW (ref 3.5–5.0)
Alkaline Phosphatase: 44 U/L (ref 38–126)
Anion gap: 9 (ref 5–15)
BUN: 58 mg/dL — ABNORMAL HIGH (ref 8–23)
CO2: 25 mmol/L (ref 22–32)
Calcium: 8.7 mg/dL — ABNORMAL LOW (ref 8.9–10.3)
Chloride: 99 mmol/L (ref 98–111)
Creatinine, Ser: 1.83 mg/dL — ABNORMAL HIGH (ref 0.61–1.24)
GFR calc Af Amer: 44 mL/min — ABNORMAL LOW (ref >60)
GFR calc non Af Amer: 38 mL/min — ABNORMAL LOW (ref >60)
Glucose, Bld: 112 mg/dL — ABNORMAL HIGH (ref 70–99)
Potassium: 3.5 mmol/L (ref 3.5–5.1)
Sodium: 133 mmol/L — ABNORMAL LOW (ref 135–145)
Total Bilirubin: 0.9 mg/dL (ref 0.3–1.2)
Total Protein: 7.4 g/dL (ref 6.5–8.1)

## 2019-11-28 LAB — CBC WITH DIFFERENTIAL/PLATELET
Abs Immature Granulocytes: 0.16 10*3/uL — ABNORMAL HIGH (ref 0.00–0.07)
Basophils Absolute: 0 10*3/uL (ref 0.0–0.1)
Basophils Relative: 0 %
Eosinophils Absolute: 0 10*3/uL (ref 0.0–0.5)
Eosinophils Relative: 1 %
HCT: 34 % — ABNORMAL LOW (ref 39.0–52.0)
Hemoglobin: 11.3 g/dL — ABNORMAL LOW (ref 13.0–17.0)
Immature Granulocytes: 4 %
Lymphocytes Relative: 14 %
Lymphs Abs: 0.6 10*3/uL — ABNORMAL LOW (ref 0.7–4.0)
MCH: 30.7 pg (ref 26.0–34.0)
MCHC: 33.2 g/dL (ref 30.0–36.0)
MCV: 92.4 fL (ref 80.0–100.0)
Monocytes Absolute: 0.1 10*3/uL (ref 0.1–1.0)
Monocytes Relative: 3 %
Neutro Abs: 3.4 10*3/uL (ref 1.7–7.7)
Neutrophils Relative %: 78 %
Platelets: 318 10*3/uL (ref 150–400)
RBC: 3.68 MIL/uL — ABNORMAL LOW (ref 4.22–5.81)
RDW: 14.1 % (ref 11.5–15.5)
WBC: 4.4 10*3/uL (ref 4.0–10.5)
nRBC: 0.7 % — ABNORMAL HIGH (ref 0.0–0.2)

## 2019-11-28 LAB — C-REACTIVE PROTEIN: CRP: 18.8 mg/dL — ABNORMAL HIGH (ref <1.0)

## 2019-11-28 LAB — BLOOD GAS, VENOUS
Acid-Base Excess: 2.9 mmol/L — ABNORMAL HIGH (ref 0.0–2.0)
Bicarbonate: 27.1 mmol/L (ref 20.0–28.0)
FIO2: 36
O2 Saturation: 96.5 %
Patient temperature: 37
pCO2, Ven: 38.3 mmHg — ABNORMAL LOW (ref 44.0–60.0)
pH, Ven: 7.456 — ABNORMAL HIGH (ref 7.250–7.430)
pO2, Ven: 89.3 mmHg — ABNORMAL HIGH (ref 32.0–45.0)

## 2019-11-28 LAB — PROCALCITONIN: Procalcitonin: 0.21 ng/mL

## 2019-11-28 LAB — FIBRINOGEN: Fibrinogen: 725 mg/dL — ABNORMAL HIGH (ref 210–475)

## 2019-11-28 LAB — LACTATE DEHYDROGENASE: LDH: 372 U/L — ABNORMAL HIGH (ref 98–192)

## 2019-11-28 LAB — FERRITIN: Ferritin: 1909 ng/mL — ABNORMAL HIGH (ref 24–336)

## 2019-11-28 LAB — BRAIN NATRIURETIC PEPTIDE: B Natriuretic Peptide: 40 pg/mL (ref 0.0–100.0)

## 2019-11-28 LAB — D-DIMER, QUANTITATIVE: D-Dimer, Quant: 3.3 ug/mL-FEU — ABNORMAL HIGH (ref 0.00–0.50)

## 2019-11-28 LAB — POC SARS CORONAVIRUS 2 AG -  ED: SARS Coronavirus 2 Ag: NEGATIVE

## 2019-11-28 MED ORDER — ZINC SULFATE 220 (50 ZN) MG PO CAPS
220.0000 mg | ORAL_CAPSULE | Freq: Every day | ORAL | Status: DC
  Administered 2019-11-29 – 2019-11-30 (×2): 220 mg via ORAL
  Filled 2019-11-28 (×2): qty 1

## 2019-11-28 MED ORDER — ACETAMINOPHEN 325 MG PO TABS: 650.0000 mg | ORAL_TABLET | Freq: Four times a day (QID) | ORAL | Status: DC | PRN

## 2019-11-28 MED ORDER — POTASSIUM CHLORIDE IN NACL 20-0.9 MEQ/L-% IV SOLN
INTRAVENOUS | Status: AC
  Filled 2019-11-28: qty 1000

## 2019-11-28 MED ORDER — ONDANSETRON HCL 4 MG PO TABS: 4.0000 mg | ORAL_TABLET | Freq: Four times a day (QID) | ORAL | Status: DC | PRN

## 2019-11-28 MED ORDER — ALBUTEROL SULFATE HFA 108 (90 BASE) MCG/ACT IN AERS: 1.0000 | INHALATION_SPRAY | RESPIRATORY_TRACT | Status: DC | PRN

## 2019-11-28 MED ORDER — DEXAMETHASONE SODIUM PHOSPHATE 10 MG/ML IJ SOLN
6.0000 mg | Freq: Once | INTRAMUSCULAR | Status: AC
  Administered 2019-11-28: 6 mg via INTRAVENOUS
  Filled 2019-11-28: qty 1

## 2019-11-28 MED ORDER — ONDANSETRON HCL 4 MG/2ML IJ SOLN: 4.0000 mg | Freq: Four times a day (QID) | INTRAMUSCULAR | Status: DC | PRN

## 2019-11-28 MED ORDER — SODIUM CHLORIDE 0.9 % IV SOLN
1.0000 g | INTRAVENOUS | Status: AC
  Administered 2019-11-28: 23:00:00 1 g via INTRAVENOUS
  Filled 2019-11-28 (×2): qty 10

## 2019-11-28 MED ORDER — AMLODIPINE BESYLATE 5 MG PO TABS
10.0000 mg | ORAL_TABLET | Freq: Every day | ORAL | Status: DC
  Administered 2019-11-29 – 2019-11-30 (×2): 10 mg via ORAL
  Filled 2019-11-28 (×2): qty 2

## 2019-11-28 MED ORDER — ASCORBIC ACID 500 MG PO TABS
500.0000 mg | ORAL_TABLET | Freq: Every day | ORAL | Status: DC
  Administered 2019-11-29 – 2019-11-30 (×2): 500 mg via ORAL
  Filled 2019-11-28 (×2): qty 1

## 2019-11-28 MED ORDER — SODIUM CHLORIDE 0.9 % IV SOLN
500.0000 mg | INTRAVENOUS | Status: AC
  Administered 2019-11-29: 500 mg via INTRAVENOUS
  Filled 2019-11-28 (×2): qty 500

## 2019-11-28 MED ORDER — DEXAMETHASONE SODIUM PHOSPHATE 10 MG/ML IJ SOLN
6.0000 mg | INTRAMUSCULAR | Status: DC
  Administered 2019-11-29 – 2019-11-30 (×2): 6 mg via INTRAVENOUS
  Filled 2019-11-28 (×2): qty 1

## 2019-11-28 MED ORDER — ENOXAPARIN SODIUM 40 MG/0.4ML ~~LOC~~ SOLN
40.0000 mg | SUBCUTANEOUS | Status: DC
  Administered 2019-11-29 (×2): 40 mg via SUBCUTANEOUS
  Filled 2019-11-28 (×2): qty 0.4

## 2019-11-28 MED ORDER — POLYETHYLENE GLYCOL 3350 17 G PO PACK: 17.0000 g | PACK | Freq: Every day | ORAL | Status: DC | PRN

## 2019-11-28 MED ORDER — METOPROLOL SUCCINATE ER 50 MG PO TB24
100.0000 mg | ORAL_TABLET | Freq: Every day | ORAL | Status: DC
  Administered 2019-11-29 – 2019-11-30 (×2): 100 mg via ORAL
  Filled 2019-11-28 (×2): qty 2

## 2019-11-28 MED ORDER — ATORVASTATIN CALCIUM 20 MG PO TABS
20.0000 mg | ORAL_TABLET | Freq: Every day | ORAL | Status: DC
  Administered 2019-11-29 – 2019-11-30 (×2): 20 mg via ORAL
  Filled 2019-11-28 (×2): qty 1

## 2019-11-28 MED ORDER — GUAIFENESIN-DM 100-10 MG/5ML PO SYRP: 10.0000 mL | ORAL_SOLUTION | ORAL | Status: DC | PRN

## 2019-11-28 NOTE — H&P (Addendum)
History and Physical    Jack Cherry [id number] DOB: 1956-03-06 DOA: 11/28/2019  PCP: Soyla Dryer, PA-C   Patient coming from: Home  I have personally briefly reviewed patient's old medical records in [place]  Chief Complaint: Cough, confusion  HPI: Jack Cherry is a 64 y.o. male with medical history significant for hypertension, diabetes mellitus.  Patient was brought to the ED via EMS for reports of patient 'not acting right" per family.  Patient's niece checks patient's O2 sats and they were 84 to 85% on room air, he was placed on 4 L by EMS sats increased to 94%. On my evaluation patient is awake and alert, answers questions appropriately.  Reports a dry cough over the past few days, denies difficulty breathing.  No fever no chills.  No known Covid positive contacts.  ED Course: Temperature 98.9.  Tachypneic.  O2 sats 89% on room air, initially placed on 4 L, reduced to 3 L O2 sats greater than 95%.  POC COVID-19 test negative.  Elevation in inflammatory markers D-dimer, CRP, LDH, fibrinogen. Chest x-ray consistent with COVID-19 pneumonia.  Dexamethasone given in ED.  Review of Systems: As per HPI all other systems reviewed and negative.  Past Medical History:  Diagnosis Date  . 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 Diabetes ([place])   . High cholesterol   . Hypertension     Past Surgical History:  Procedure Laterality Date  . COLONOSCOPY N/A 01/08/2018   Dr. Oneida Alar three 3-8 mm pol;yps, simple adenomas. internal hemorrhoids. next colonoscopy in 3 years  . ESOPHAGOGASTRODUODENOSCOPY N/A 01/08/2018   Dr. Oneida Alar: normal esophagus, mild gastritis/duodenitis, H.pylori  . GIVENS CAPSULE STUDY N/A 12/21/2018   Procedure: GIVENS CAPSULE STUDY;  Surgeon: Danie Binder, MD;  Location: [facility];  Service: Endoscopy;  Laterality: N/A;  7:30am  . HERNIA REPAIR       reports that he has never smoked. He has never used  smokeless tobacco. He reports previous drug use. Drug: Marijuana. He reports that he does not drink alcohol.  No Known Allergies  Family History  Problem Relation Age of Onset  . Cancer Mother        not sure what kind    Prior to Admission medications   Medication Sig Start Date End Date Taking? Authorizing Provider  amLODipine (NORVASC) 10 MG tablet Take 1 tablet (10 mg total) by mouth daily. for blood pressure 01/18/19   Soyla Dryer, PA-C  atorvastatin (LIPITOR) 20 MG tablet Take 1 tablet (20 mg total) by mouth daily. 01/18/19   Soyla Dryer, PA-C  hydrochlorothiazide (HYDRODIURIL) 25 MG tablet Take 1 tablet (25 mg total) by mouth daily. 01/18/19   Soyla Dryer, PA-C  loratadine (CLARITIN) 10 MG tablet Take 1 tablet (10 mg total) by mouth daily as needed. 01/18/19   Soyla Dryer, PA-C  losartan (COZAAR) 100 MG tablet TAKE 1 Tablet BY MOUTH EVERY DAY 10/12/19   Soyla Dryer, PA-C  metFORMIN (GLUCOPHAGE XR) 500 MG 24 hr tablet Take 1 tablet (500 mg total) by mouth daily with breakfast. 01/18/19   Soyla Dryer, PA-C  metoprolol succinate (TOPROL-XL) 100 MG 24 hr tablet Take 1 tablet (100 mg total) by mouth daily. Take with or immediately following a meal. 08/30/19   Soyla Dryer, PA-C  Omega-3 Fatty Acids (FISH OIL) 1000 MG CAPS Take 1 capsule (1,000 mg total) by mouth daily. 07/20/17   Soyla Dryer, PA-C  omeprazole (PRILOSEC) 20 MG  capsule TAKE 1 Capsule BY MOUTH EVERY MORNING 30 MINUTES BEFORE BREAKFAST 01/18/19   Soyla Dryer, PA-C    Physical Exam: Vitals:   11/28/19 2030 11/28/19 2100 11/28/19 2130 11/28/19 2200  BP: 114/79 137/88 125/79 110/77  Pulse: 85 88 85 88  Resp: (!) 30 (!) 37 (!) 21 (!) 29  Temp:      TempSrc:      SpO2: 96% 97% 97% 95%  Weight:      Height:        Constitutional: NAD, calm, comfortable Vitals:   11/28/19 2030 11/28/19 2100 11/28/19 2130 11/28/19 2200  BP: 114/79 137/88 125/79 110/77  Pulse: 85 88 85 88  Resp: (!)  30 (!) 37 (!) 21 (!) 29  Temp:      TempSrc:      SpO2: 96% 97% 97% 95%  Weight:      Height:       Eyes: lids and conjunctivae normal ENMT: Mucous membranes are moist.   Neck: normal, supple,  Respiratory: Normal respiratory effort. No accessory muscle use.  Cardiovascular: Regular rate and rhythm,  No extremity edema. 2+ pedal pulses.   Abdomen: Full abdomen, no tenderness,  No hepatosplenomegaly. Bowel sounds positive.  Musculoskeletal: no clubbing / cyanosis. No joint deformity upper and lower extremities.  Skin: no rashes, lesions, ulcers. No induration Neurologic: No gross cranial nerve abnormalities, moving all extremities spontaneously,  Psychiatric: Normal judgment and insight. Alert and oriented x 3. Normal mood.   Labs on Admission: I have personally reviewed following labs and imaging studies  CBC: Recent Labs  Lab 11/28/19 1923  WBC 4.4  NEUTROABS 3.4  HGB 11.3*  HCT 34.0*  MCV 92.4  PLT 0000000   Basic Metabolic Panel: Recent Labs  Lab 11/28/19 1923  NA 133*  K 3.5  CL 99  CO2 25  GLUCOSE 112*  BUN 58*  CREATININE 1.83*  CALCIUM 8.7*   Liver Function Tests: Recent Labs  Lab 11/28/19 1923  AST 63*  ALT 33  ALKPHOS 44  BILITOT 0.9  PROT 7.4  ALBUMIN 3.2*    Radiological Exams on Admission: DG Chest Portable 1 View  Result Date: 11/28/2019 CLINICAL DATA:  Shortness of breath EXAM: PORTABLE CHEST 1 VIEW COMPARISON:  12/26/2015 FINDINGS: Diffuse bilateral airspace disease concerning for infection. COVID pneumonia could have this appearance. Heart is normal size. No effusions or acute bony abnormality. IMPRESSION: Diffuse bilateral airspace disease concerning for pneumonia. COVID pneumonia could have this appearance. Electronically Signed   By: Rolm Baptise M.D.   On: 11/28/2019 19:31    EKG: Independently reviewed.  Sinus rhythm.  QTc 407.  Nonspecific T wave abnormalities to V5 and V6.  Assessment/Plan Principal Problem:   Acute respiratory  failure (HCC) Active Problems:   Essential hypertension, benign   Type 2 diabetes mellitus without complication (HCC)   AKI (acute kidney injury) ([place])  Acute hypoxic respiratory failure with pneumonia- cough without dyspnea, O2 sats initially 89% on room air, currently on 3 L O2 sats greater than 95% . POC Covid test negative.  Elevation in inflammatory markers CRP, D-dimer, ferritin, LDH, fibrinogen.  Portable chest x-ray-diffuse bilateral airspace disease concerning for COVID-19  - Obtain pro-calcitonin - Daily inflammatory markers - Continue dexamethasone - Mucolytics, as needed albuterol inhaler -Supplemental O2 -DC IV antibiotic ceftriaxone and azithromycin if Covid test comes back positive  Acute kidney injury- likely prerenal from dehydration, with HCTZ and losartan use. - N/s + 20 KCL 100cc/hr x 15 hrs -Hold home  HCTZ and losartan  Hypertension-.  Stable -Hold home losartan and Lasix in the setting of acute kidney injury -Resume home metoprolol, Norvasc  Diabetes mellitus- random glucose 112.  Recent hgba1c- 6.9.  - SSI- m -Hold home Metformin -Resume home statin   DVT prophylaxis: Lovenox Code Status: Full code Family Communication: None at bedside Disposition Plan: > 2 days Consults called:  Admission status: inPatient, telemetry I certify that at the point of admission it is my clinical judgment that the patient will require inpatient hospital care spanning beyond 2 midnights from the point of admission due to high intensity of service, high risk for further deterioration and high frequency of surveillance required.    Bethena Roys MD [facility]  11/28/2019, 11:03 PM

## 2019-11-28 NOTE — ED Provider Notes (Signed)
[facility] EMERGENCY DEPARTMENT Provider Note   CSN: [id number] Arrival date & time: 11/28/19  1853     History Chief Complaint  Patient presents with  . Shortness of Breath    Low 02 sats    Jack Cherry is a 64 y.o. male possible history of diabetes, hypercholesterol, hypertension who presents via EMS for evaluation of low O2 sats.  Per EMS, patient's niece noted that patient was ranging between 81-84% on home pulse oximetry.  She called EMS.  On EMS arrival, they confirmed low O2 sats in the mid 80s.  He was placed on 4 L O2 which brought him up to 93-94%.  Patient states that he is not having any trouble breathing.  He states that he has felt fine over the last few days.  He does state that he was recently put on a medication that causes him to pee a lot but he does not know why.  He states he has not any recent travel and denies any recent Covid exposure that he knows of.  He does state that he has had a slight cough but states it is nonproductive.  He denies any fevers, difficulty breathing, chest pain, abdominal pain, swelling in his legs.  The history is provided by the patient.       Past Medical History:  Diagnosis Date  . 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 Diabetes ([place])   . High cholesterol   . Hypertension     Patient Active Problem List   Diagnosis Date Noted  . Acute respiratory failure ([place]) 11/28/2019  . Helicobacter pylori gastritis 06/11/2018  . Heme positive stool 12/08/2017  . Normocytic anemia 12/08/2017  . 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 Premature contractions, atrial 05/19/2016  . Heart murmur 05/19/2016  . Type 2 diabetes mellitus without complication ([place]) [id number]  . Hyperlipidemia 01/21/2016  . Obesity, unspecified 01/21/2016  . Cough 12/24/2015  . Essential hypertension, benign 09/24/2015    Past Surgical History:  Procedure Laterality Date  . COLONOSCOPY N/A 01/08/2018   Dr. Oneida Alar  three 3-8 mm pol;yps, simple adenomas. internal hemorrhoids. next colonoscopy in 3 years  . ESOPHAGOGASTRODUODENOSCOPY N/A 01/08/2018   Dr. Oneida Alar: normal esophagus, mild gastritis/duodenitis, H.pylori  . GIVENS CAPSULE STUDY N/A 12/21/2018   Procedure: GIVENS CAPSULE STUDY;  Surgeon: Danie Binder, MD;  Location: [facility] SU[facility];  Service: Endoscopy;  Laterality: N/A;  7:30am  . HERNIA REPAIR         Family History  Problem Relation Age of Onset  . Cancer Mother        not sure what kind    Social History   Tobacco Use  . Smoking status: Never Smoker  . Smokeless tobacco: Never Used  Substance Use Topics  . Alcohol use: No  . Drug use: Not Currently    Types: Marijuana    Home Medications Prior to Admission medications   Medication Sig Start Date End Date Taking? Authorizing Provider  amLODipine (NORVASC) 10 MG tablet Take 1 tablet (10 mg total) by mouth daily. for blood pressure 01/18/19   Soyla Dryer, PA-C  atorvastatin (LIPITOR) 20 MG tablet Take 1 tablet (20 mg total) by mouth daily. 01/18/19   Soyla Dryer, PA-C  hydrochlorothiazide (HYDRODIURIL) 25 MG tablet Take 1 tablet (25 mg total) by mouth daily. 01/18/19   Soyla Dryer, PA-C  loratadine (CLARITIN) 10 MG tablet Take 1 tablet (10 mg total) by mouth  daily as needed. 01/18/19   Soyla Dryer, PA-C  losartan (COZAAR) 100 MG tablet TAKE 1 Tablet BY MOUTH EVERY DAY 10/12/19   Soyla Dryer, PA-C  metFORMIN (GLUCOPHAGE XR) 500 MG 24 hr tablet Take 1 tablet (500 mg total) by mouth daily with breakfast. 01/18/19   Soyla Dryer, PA-C  metoprolol succinate (TOPROL-XL) 100 MG 24 hr tablet Take 1 tablet (100 mg total) by mouth daily. Take with or immediately following a meal. 08/30/19   Soyla Dryer, PA-C  Omega-3 Fatty Acids (FISH OIL) 1000 MG CAPS Take 1 capsule (1,000 mg total) by mouth daily. 07/20/17   Soyla Dryer, PA-C  omeprazole (PRILOSEC) 20 MG capsule TAKE 1 Capsule BY MOUTH EVERY MORNING 30  MINUTES BEFORE BREAKFAST 01/18/19   Soyla Dryer, PA-C    Allergies    Patient has no known allergies.  Review of Systems   Review of Systems  Constitutional: Negative for fever.  Respiratory: Positive for cough. Negative for shortness of breath.   Cardiovascular: Negative for chest pain and leg swelling.  Gastrointestinal: Negative for abdominal pain, nausea and vomiting.  Genitourinary: Negative for dysuria and hematuria.  Neurological: Negative for headaches.  All other systems reviewed and are negative.   Physical Exam Updated Vital Signs BP 137/88   Pulse 88   Temp 98.9 F (37.2 C) (Oral)   Resp (!) 37   Ht 6\' 1"  (1.854 m)   Wt 117.9 kg   SpO2 97%   BMI 34.30 kg/m   Physical Exam Vitals and nursing note reviewed.  Constitutional:      Appearance: Normal appearance. He is well-developed.  HENT:     Head: Normocephalic and atraumatic.  Eyes:     General: Lids are normal.     Conjunctiva/sclera: Conjunctivae normal.     Pupils: Pupils are equal, round, and reactive to light.  Cardiovascular:     Rate and Rhythm: Normal rate and regular rhythm.     Pulses: Normal pulses.     Heart sounds: Normal heart sounds. No murmur. No friction rub. No gallop.   Pulmonary:     Effort: Pulmonary effort is normal. Tachypnea present.     Breath sounds: Normal breath sounds.     Comments: Tachypnea noted.  He does appear to have some slight increased work of breathing.  No obvious wheezing noted on lung exam.  He does have some faint crackles noted in the bilateral bases. Abdominal:     Palpations: Abdomen is soft. Abdomen is not rigid.     Tenderness: There is no abdominal tenderness. There is no guarding.     Comments: Slightly protuberant abdomen.  No tenderness to palpation noted.  Musculoskeletal:        General: Normal range of motion.     Cervical back: Full passive range of motion without pain.     Comments: Bilateral lower extremities are symmetric in appearance  without any overlying warmth, erythema.  Skin:    General: Skin is warm and dry.     Capillary Refill: Capillary refill takes less than 2 seconds.  Neurological:     Mental Status: He is alert and oriented to person, place, and time.  Psychiatric:        Speech: Speech normal.     ED Results / Procedures / Treatments   Labs (all labs ordered are listed, but only abnormal results are displayed) Labs Reviewed  COMPREHENSIVE METABOLIC PANEL - Abnormal; Notable for the following components:      Result Value  Sodium 133 (*)    Glucose, Bld 112 (*)    BUN 58 (*)    Creatinine, Ser 1.83 (*)    Calcium 8.7 (*)    Albumin 3.2 (*)    AST 63 (*)    GFR calc non Af Amer 38 (*)    GFR calc Af Amer 44 (*)    All other components within normal limits  CBC WITH DIFFERENTIAL/PLATELET - Abnormal; Notable for the following components:   RBC 3.68 (*)    Hemoglobin 11.3 (*)    HCT 34.0 (*)    nRBC 0.7 (*)    Lymphs Abs 0.6 (*)    Abs Immature Granulocytes 0.16 (*)    All other components within normal limits  BLOOD GAS, VENOUS - Abnormal; Notable for the following components:   pH, Ven 7.456 (*)    pCO2, Ven 38.3 (*)    pO2, Ven 89.3 (*)    Acid-Base Excess 2.9 (*)    All other components within normal limits  LACTATE DEHYDROGENASE - Abnormal; Notable for the following components:   LDH 372 (*)    All other components within normal limits  FIBRINOGEN - Abnormal; Notable for the following components:   Fibrinogen 725 (*)    All other components within normal limits  SARS CORONAVIRUS 2 (TAT 6-24 HRS)  BRAIN NATRIURETIC PEPTIDE  FERRITIN  D-DIMER, QUANTITATIVE (NOT AT [facility])  C-REACTIVE PROTEIN  POC SARS CORONAVIRUS 2 AG -  ED    EKG EKG Interpretation  Date/Time:  Monday November 28 2019 19:11:10 EST Ventricular Rate:  99 PR Interval:    QRS Duration: 90 QT Interval:  317 QTC Calculation: 407 R Axis:   45 Text Interpretation: Sinus rhythm Nonspecific T abnormalities, lateral  leads Confirmed by Fredia Sorrow [phone]) on 11/28/2019 7:17:43 PM   Radiology DG Chest Portable 1 View  Result Date: 11/28/2019 CLINICAL DATA:  Shortness of breath EXAM: PORTABLE CHEST 1 VIEW COMPARISON:  12/26/2015 FINDINGS: Diffuse bilateral airspace disease concerning for infection. COVID pneumonia could have this appearance. Heart is normal size. No effusions or acute bony abnormality. IMPRESSION: Diffuse bilateral airspace disease concerning for pneumonia. COVID pneumonia could have this appearance. Electronically Signed   By: Rolm Baptise M.D.   On: 11/28/2019 19:31    Procedures .Critical Care Performed by: Volanda Napoleon, PA-C Authorized by: Volanda Napoleon, PA-C   Critical care provider statement:    Critical care time (minutes):  30   Critical care was necessary to treat or prevent imminent or life-threatening deterioration of the following conditions:  Respiratory failure (hypoxia)   Critical care was time spent personally by me on the following activities:  Discussions with consultants, evaluation of patient's response to treatment, examination of patient, ordering and performing treatments and interventions, ordering and review of laboratory studies, ordering and review of radiographic studies, pulse oximetry, re-evaluation of patient's condition, obtaining history from patient or surrogate and review of old charts   (including critical care time)  Medications Ordered in ED Medications  dexamethasone (DECADRON) injection 6 mg (6 mg Intravenous Given 11/28/19 2100)    ED Course  I have reviewed the triage vital signs and the nursing notes.  Pertinent labs & imaging results that were available during my care of the patient were reviewed by me and considered in my medical decision making (see chart for details).    MDM Rules/Calculators/A&P  64 year old man brought in by by EMS for evaluation of hypoxia that was noted at home pulse oximeter.  Was  confirmed on EMS arrival that he was in the mid 70s and was placed on 4 L O2 with improvement.  On initial ED arrival, he is afebrile but does appear to be tachypneic and have some mild increased work of breathing.  Vitals otherwise stable.  Patient was taken off O2 when he initially came into the ED and stood up to have him go to the bathroom and he dropped into the high 70s.  He was placed back on 4 L O2 with improvement.  On exam, he does appear to have some increased work of breathing and tachypnea.  Lungs show some faint crackles at bilateral bases.  No leg swelling noted.  He does mention that he was recently started on a medication that makes him pee a lot but he does not know why.  We will plan to check labs, chest x-ray.  I attempted to call family to gather more information but was unable to get in contact with anybody.  CBC shows no leukocytosis.  Hemoglobin stable at 11.3.  His initial rapid Covid is negative.  His BNP is normal.  CMP shows BUN and creatinine at 50 and 1.83.  He looks like his baseline is around 1.21.  VBG shows pH of 7.4.  PCO2 is 38.3.  Chest x-ray shows diffuse bilateral airspace concerning for pneumonia.  Covid pneumonia could have this appearance.  At this time, patient is hypoxic and is requiring 4 L of home O2.  He does not have any home O2 requirement.  Given hypoxia, concern for possible Covid pneumonia, will plan for admission.  Discussed patient with hospitalist who accepts patient for admission.   Aerion Volker was evaluated in Emergency Department on 11/28/2019 for the symptoms described in the history of present illness. He was evaluated in the context of the global COVID-19 pandemic, which necessitated consideration that the patient might be at risk for infection with the SARS-CoV-2 virus that causes COVID-19. Institutional protocols and algorithms that pertain to the evaluation of patients at risk for COVID-19 are in a state of rapid change based on information  released by regulatory bodies including the CDC and federal and state organizations. These policies and algorithms were followed during the patient's care in the ED.  Portions of this note were generated with [employer]. Dictation errors may occur despite best attempts at proofreading.   Final Clinical Impression(s) / ED Diagnoses Final diagnoses:  Hypoxia  Suspected COVID-19 virus infection    Rx / DC Orders ED Discharge Orders    None       Desma Mcgregor 11/28/19 2142    Fredia Sorrow, MD 11/29/19 1527

## 2019-11-28 NOTE — ED Triage Notes (Signed)
Pt from home via [facility]. Per family, pt "not acting right." Niece checked pt sats with handheld device with readings 84-85% on RA. Sats verified by EMS, they placed pt on 4L  [place]. Sats increased to 94%.  Pt has no complaints

## 2019-11-29 DIAGNOSIS — J069 Acute upper respiratory infection, unspecified: Secondary | ICD-10-CM | POA: Diagnosis present

## 2019-11-29 DIAGNOSIS — J9601 Acute respiratory failure with hypoxia: Secondary | ICD-10-CM | POA: Diagnosis present

## 2019-11-29 LAB — COMPREHENSIVE METABOLIC PANEL
ALT: 29 U/L (ref 0–44)
AST: 53 U/L — ABNORMAL HIGH (ref 15–41)
Albumin: 2.9 g/dL — ABNORMAL LOW (ref 3.5–5.0)
Alkaline Phosphatase: 43 U/L (ref 38–126)
Anion gap: 12 (ref 5–15)
BUN: 59 mg/dL — ABNORMAL HIGH (ref 8–23)
CO2: 25 mmol/L (ref 22–32)
Calcium: 8.8 mg/dL — ABNORMAL LOW (ref 8.9–10.3)
Chloride: 102 mmol/L (ref 98–111)
Creatinine, Ser: 1.63 mg/dL — ABNORMAL HIGH (ref 0.61–1.24)
GFR calc Af Amer: 51 mL/min — ABNORMAL LOW (ref >60)
GFR calc non Af Amer: 44 mL/min — ABNORMAL LOW (ref >60)
Glucose, Bld: 143 mg/dL — ABNORMAL HIGH (ref 70–99)
Potassium: 4.4 mmol/L (ref 3.5–5.1)
Sodium: 139 mmol/L (ref 135–145)
Total Bilirubin: 0.7 mg/dL (ref 0.3–1.2)
Total Protein: 7 g/dL (ref 6.5–8.1)

## 2019-11-29 LAB — CBC WITH DIFFERENTIAL/PLATELET
Abs Immature Granulocytes: 0.17 10*3/uL — ABNORMAL HIGH (ref 0.00–0.07)
Basophils Absolute: 0 10*3/uL (ref 0.0–0.1)
Basophils Relative: 1 %
Eosinophils Absolute: 0 10*3/uL (ref 0.0–0.5)
Eosinophils Relative: 0 %
HCT: 33.1 % — ABNORMAL LOW (ref 39.0–52.0)
Hemoglobin: 10.7 g/dL — ABNORMAL LOW (ref 13.0–17.0)
Immature Granulocytes: 4 %
Lymphocytes Relative: 11 %
Lymphs Abs: 0.4 10*3/uL — ABNORMAL LOW (ref 0.7–4.0)
MCH: 30.7 pg (ref 26.0–34.0)
MCHC: 32.3 g/dL (ref 30.0–36.0)
MCV: 95.1 fL (ref 80.0–100.0)
Monocytes Absolute: 0.1 10*3/uL (ref 0.1–1.0)
Monocytes Relative: 4 %
Neutro Abs: 3.1 10*3/uL (ref 1.7–7.7)
Neutrophils Relative %: 80 %
Platelets: 309 10*3/uL (ref 150–400)
RBC: 3.48 MIL/uL — ABNORMAL LOW (ref 4.22–5.81)
RDW: 14.5 % (ref 11.5–15.5)
WBC: 3.9 10*3/uL — ABNORMAL LOW (ref 4.0–10.5)
nRBC: 0.5 % — ABNORMAL HIGH (ref 0.0–0.2)

## 2019-11-29 LAB — RESPIRATORY PANEL BY RT PCR (FLU A&B, COVID)
Influenza A by PCR: NEGATIVE
Influenza B by PCR: NEGATIVE
SARS Coronavirus 2 by RT PCR: POSITIVE — AB

## 2019-11-29 LAB — C-REACTIVE PROTEIN: CRP: 18.9 mg/dL — ABNORMAL HIGH (ref <1.0)

## 2019-11-29 LAB — ABO/RH: ABO/RH(D): O POS

## 2019-11-29 LAB — D-DIMER, QUANTITATIVE: D-Dimer, Quant: 4.94 ug/mL-FEU — ABNORMAL HIGH (ref 0.00–0.50)

## 2019-11-29 LAB — GLUCOSE, CAPILLARY: Glucose-Capillary: 136 mg/dL — ABNORMAL HIGH (ref 70–99)

## 2019-11-29 LAB — HIV ANTIBODY (ROUTINE TESTING W REFLEX): HIV Screen 4th Generation wRfx: NONREACTIVE

## 2019-11-29 LAB — FERRITIN: Ferritin: 1742 ng/mL — ABNORMAL HIGH (ref 24–336)

## 2019-11-29 MED ORDER — SODIUM CHLORIDE 0.9 % IV SOLN
200.0000 mg | Freq: Once | INTRAVENOUS | Status: AC
  Administered 2019-11-29: 10:00:00 200 mg via INTRAVENOUS
  Filled 2019-11-29: qty 40

## 2019-11-29 MED ORDER — PRO-STAT SUGAR FREE PO LIQD
30.0000 mL | Freq: Two times a day (BID) | ORAL | Status: DC
  Administered 2019-11-29 – 2019-11-30 (×3): 30 mL via ORAL
  Filled 2019-11-29 (×3): qty 30

## 2019-11-29 MED ORDER — ENSURE ENLIVE PO LIQD
237.0000 mL | Freq: Two times a day (BID) | ORAL | Status: DC
  Administered 2019-11-30: 237 mL via ORAL

## 2019-11-29 MED ORDER — ADULT MULTIVITAMIN W/MINERALS CH
1.0000 | ORAL_TABLET | Freq: Every day | ORAL | Status: DC
  Administered 2019-11-29 – 2019-11-30 (×2): 1 via ORAL
  Filled 2019-11-29 (×2): qty 1

## 2019-11-29 MED ORDER — SODIUM CHLORIDE 0.9 % IV SOLN
100.0000 mg | Freq: Every day | INTRAVENOUS | Status: DC
  Administered 2019-11-30: 100 mg via INTRAVENOUS
  Filled 2019-11-29 (×2): qty 20

## 2019-11-29 MED ORDER — PNEUMOCOCCAL VAC POLYVALENT 25 MCG/0.5ML IJ INJ: 0.5000 mL | INJECTION | INTRAMUSCULAR | Status: DC

## 2019-11-29 MED ORDER — PANTOPRAZOLE SODIUM 40 MG PO TBEC
40.0000 mg | DELAYED_RELEASE_TABLET | Freq: Every day | ORAL | Status: DC
  Administered 2019-11-29 – 2019-11-30 (×2): 40 mg via ORAL
  Filled 2019-11-29 (×2): qty 1

## 2019-11-29 NOTE — ED Notes (Signed)
Date and time results received: 11/29/19 0232 (use smartphrase ".now" to insert current time)  Test: Covid Critical Value: positive  Name of Provider Notified: [place]  Orders Received? Or Actions Taken?:  

## 2019-11-29 NOTE — ED Notes (Signed)
ED TO INPATIENT HANDOFF REPORT  ED Nurse Name and Phone #:   S Name/Age/Gender Jack Cherry 64 y.o. male Room/Bed: APA16A/APA16A  Code Status   Code Status: Full Code  Home/SNF/Other Home Patient oriented to: self, place, time and situation Is this baseline? Yes   Triage Complete: Triage complete  Chief Complaint Acute respiratory failure ([place]) [J96.00]  Triage Note Pt from home via [facility]. Per family, pt "not acting right." Niece checked pt sats with handheld device with readings 84-85% on RA. Sats verified by EMS, they placed pt on 4L  [place]. Sats increased to 94%.  Pt has no complaints    Allergies No Known Allergies  Level of Care/Admitting Diagnosis ED Disposition    ED Disposition Condition [facility] Area: [facility] [[id number]]  Level of Care: Telemetry [5]  Covid Evaluation: Symptomatic Person Under Investigation (PUI)  Diagnosis: Acute respiratory failure ([place]) [518.81.ICD-9-CM]  Admitting Physician: Bethena Roys [[id number]]  Attending Physician: Bethena Roys [phone]  Estimated length of stay: past midnight tomorrow  Certification:: I certify this patient will need inpatient services for at least 2 midnights       B Medical/Surgery History Past Medical History:  Diagnosis Date  . 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 Diabetes ([place])   . High cholesterol   . Hypertension    Past Surgical History:  Procedure Laterality Date  . COLONOSCOPY N/A 01/08/2018   Dr. Oneida Alar three 3-8 mm pol;yps, simple adenomas. internal hemorrhoids. next colonoscopy in 3 years  . ESOPHAGOGASTRODUODENOSCOPY N/A 01/08/2018   Dr. Oneida Alar: normal esophagus, mild gastritis/duodenitis, H.pylori  . GIVENS CAPSULE STUDY N/A 12/21/2018   Procedure: GIVENS CAPSULE STUDY;  Surgeon: Danie Binder, MD;  Location: [facility] ENDO SUITE;  Service: Endoscopy;  Laterality: N/A;  7:30am  . HERNIA REPAIR       A IV  Location/Drains/Wounds Patient Lines/Drains/Airways Status   Active Line/Drains/Airways    Name:   Placement date:   Placement time:   Site:   Days:   Peripheral IV 11/28/19 Right Antecubital   11/28/19    1923    Antecubital   1          Intake/Output Last 24 hours No intake or output data in the 24 hours ending 11/29/19 0236  Labs/Imaging Results for orders placed or performed during the hospital encounter of 11/28/19 (from the past 48 hour(s))  Comprehensive metabolic panel     Status: Abnormal   Collection Time: 11/28/19  7:23 PM  Result Value Ref Range   Sodium 133 (L) 135 - 145 mmol/L   Potassium 3.5 3.5 - 5.1 mmol/L   Chloride 99 98 - 111 mmol/L   CO2 25 22 - 32 mmol/L   Glucose, Bld 112 (H) 70 - 99 mg/dL   BUN 58 (H) 8 - 23 mg/dL   Creatinine, Ser 1.83 (H) 0.61 - 1.24 mg/dL   Calcium 8.7 (L) 8.9 - 10.3 mg/dL   Total Protein 7.4 6.5 - 8.1 g/dL   Albumin 3.2 (L) 3.5 - 5.0 g/dL   AST 63 (H) 15 - 41 U/L   ALT 33 0 - 44 U/L   Alkaline Phosphatase 44 38 - 126 U/L   Total Bilirubin 0.9 0.3 - 1.2 mg/dL   GFR calc non Af Amer 38 (L) >60 mL/min   GFR calc Af Amer 44 (L) >60 mL/min   Anion gap 9 5 - 15  Comment: Performed at [facility], [address], [place], [place] [address]  CBC with Differential     Status: Abnormal   Collection Time: 11/28/19  7:23 PM  Result Value Ref Range   WBC 4.4 4.0 - 10.5 K/uL   RBC 3.68 (L) 4.22 - 5.81 MIL/uL   Hemoglobin 11.3 (L) 13.0 - 17.0 g/dL   HCT 34.0 (L) 39.0 - 52.0 %   MCV 92.4 80.0 - 100.0 fL   MCH 30.7 26.0 - 34.0 pg   MCHC 33.2 30.0 - 36.0 g/dL   RDW 14.1 11.5 - 15.5 %   Platelets 318 150 - 400 K/uL   nRBC 0.7 (H) 0.0 - 0.2 %   Neutrophils Relative % 78 %   Neutro Abs 3.4 1.7 - 7.7 K/uL   Lymphocytes Relative 14 %   Lymphs Abs 0.6 (L) 0.7 - 4.0 K/uL   Monocytes Relative 3 %   Monocytes Absolute 0.1 0.1 - 1.0 K/uL   Eosinophils Relative 1 %   Eosinophils Absolute 0.0 0.0 - 0.5 K/uL   Basophils Relative 0 %    Basophils Absolute 0.0 0.0 - 0.1 K/uL   Immature Granulocytes 4 %   Abs Immature Granulocytes 0.16 (H) 0.00 - 0.07 K/uL   Reactive, Benign Lymphocytes PRESENT     Comment: Performed at [facility], [address] [place], [place] [address]  Brain natriuretic peptide     Status: None   Collection Time: 11/28/19  7:23 PM  Result Value Ref Range   B Natriuretic Peptide 40.0 0.0 - 100.0 pg/mL    Comment: Performed at [facility], [address] [place], [place] [address]  Blood gas, venous (at [facility] and [facility], not at [facility])     Status: Abnormal   Collection Time: 11/28/19  7:23 PM  Result Value Ref Range   FIO2 36.00    pH, Ven 7.456 (H) 7.250 - 7.430   pCO2, Ven 38.3 (L) 44.0 - 60.0 mmHg   pO2, Ven 89.3 (H) 32.0 - 45.0 mmHg   Bicarbonate 27.1 20.0 - 28.0 mmol/L   Acid-Base Excess 2.9 (H) 0.0 - 2.0 mmol/L   O2 Saturation 96.5 %   Patient temperature 37.0    Collection site VENOUS     Comment: Performed at [facility], [address] [place], [place] [address]  Ferritin     Status: Abnormal   Collection Time: 11/28/19  7:23 PM  Result Value Ref Range   Ferritin 1,909 (H) 24 - 336 ng/mL    Comment: Performed at [facility], [address], [place], [place] [address]  D-dimer, quantitative (not at [facility])     Status: Abnormal   Collection Time: 11/28/19  7:23 PM  Result Value Ref Range   D-Dimer, Quant 3.30 (H) 0.00 - 0.50 ug/mL-FEU    Comment: (NOTE) At the manufacturer cut-off of 0.50 ug/mL FEU, this assay has been documented to exclude PE with a sensitivity and negative predictive value of 97 to 99%.  At this time, this assay has not been approved by the FDA to exclude DVT/VTE. Results should be correlated with clinical presentation. Performed at [facility], [address], [place], [place] [address]   C-reactive protein     Status: Abnormal   Collection Time: 11/28/19  7:23 PM  Result Value Ref Range   CRP 18.8 (H) <1.0 mg/dL    Comment: Performed at [facility], [address] [place], [place] [address]  Lactate dehydrogenase     Status: Abnormal  Collection Time: 11/28/19  7:23 PM  Result Value Ref Range   LDH 372 (H) 98 - 192 U/L    Comment: Performed at [facility], [address], [place], [place] [address]  Fibrinogen     Status: Abnormal   Collection Time: 11/28/19  7:23 PM  Result Value Ref Range   Fibrinogen 725 (H) 210 - 475 mg/dL    Comment: Performed at [facility], [address] [place], [place] [address]  Procalcitonin - Baseline     Status: None   Collection Time: 11/28/19  7:23 PM  Result Value Ref Range   Procalcitonin 0.21 ng/mL    Comment:        Interpretation: PCT (Procalcitonin) <= 0.5 ng/mL: Systemic infection (sepsis) is not likely. Local bacterial infection is possible. (NOTE)       Sepsis PCT Algorithm           Lower Respiratory Tract                                      Infection PCT Algorithm    ----------------------------     ----------------------------         PCT < 0.25 ng/mL                PCT < 0.10 ng/mL         Strongly encourage             Strongly discourage   discontinuation of antibiotics    initiation of antibiotics    ----------------------------     -----------------------------       PCT 0.25 - 0.50 ng/mL            PCT 0.10 - 0.25 ng/mL               OR       >80% decrease in PCT            Discourage initiation of                                            antibiotics      Encourage discontinuation           of antibiotics    ----------------------------     -----------------------------         PCT >= 0.50 ng/mL              PCT 0.26 - 0.50 ng/mL               AND        <80% decrease in PCT             Encourage initiation of                                             antibiotics       Encourage continuation           of antibiotics    ----------------------------     -----------------------------        PCT >= 0.50 ng/mL                  PCT > 0.50 ng/mL  AND         increase in  PCT                  Strongly encourage                                      initiation of antibiotics    Strongly encourage escalation           of antibiotics                                     -----------------------------                                           PCT <= 0.25 ng/mL                                                 OR                                        > 80% decrease in PCT                                     Discontinue / Do not initiate                                             antibiotics Performed at [facility], [address], [place], [place] [address]   POC SARS Coronavirus 2 Ag-ED - Nasal Swab (BD Veritor Kit)     Status: None   Collection Time: 11/28/19  7:26 PM  Result Value Ref Range   SARS Coronavirus 2 Ag NEGATIVE NEGATIVE    Comment: (NOTE) SARS-CoV-2 antigen NOT DETECTED.  Negative results are presumptive.  Negative results do not preclude SARS-CoV-2 infection and should not be used as the sole basis for treatment or other patient management decisions, including infection  control decisions, particularly in the presence of clinical signs and  symptoms consistent with COVID-19, or in those who have been in contact with the virus.  Negative results must be combined with clinical observations, patient history, and epidemiological information. The expected result is Negative. Fact Sheet for Patients: PodPark.tn Fact Sheet for Healthcare Providers: GiftContent.is This test is not yet approved or cleared by the [place] FDA and  has been authorized for detection and/or diagnosis of SARS-CoV-2 by FDA under an Emergency Use Authorization (EUA).  This EUA will remain in effect (meaning this test can be used) for the duration of  the COVID-19 de claration under Section 564(b)(1) of the Act, 21 U.S.C. section 360bbb-3(b)(1), unless the authorization is terminated or revoked sooner.    DG  Chest Portable 1 View  Result Date: 11/28/2019 CLINICAL DATA:  Shortness of breath EXAM: PORTABLE CHEST 1 VIEW COMPARISON:  12/26/2015 FINDINGS:  Diffuse bilateral airspace disease concerning for infection. COVID pneumonia could have this appearance. Heart is normal size. No effusions or acute bony abnormality. IMPRESSION: Diffuse bilateral airspace disease concerning for pneumonia. COVID pneumonia could have this appearance. Electronically Signed   By: Rolm Baptise M.D.   On: 11/28/2019 19:31    Pending Labs Unresulted Labs (From admission, onward)    Start     Ordered   11/29/19 0500  CBC with Differential/Platelet  Daily,   R     11/28/19 2306   11/29/19 0500  Comprehensive metabolic panel  Daily,   R     11/28/19 2306   11/29/19 0500  C-reactive protein  Daily,   R     11/28/19 2306   11/29/19 0500  D-dimer, quantitative (not at [facility])  Daily,   R     11/28/19 2306   11/29/19 0500  Ferritin  Daily,   R     11/28/19 2306   11/29/19 0130  Respiratory Panel by RT PCR (Flu A&B, Covid) - Nasopharyngeal Swab  (Tier 2 Respiratory Panel by RT PCR (Flu A&B, Covid) (TAT 2 hrs))  Once,   STAT    Question Answer Comment  Is this test for diagnosis or screening Diagnosis of ill patient   Symptomatic for COVID-19 as defined by CDC Yes   Date of Symptom Onset 11/29/2019   Hospitalized for COVID-19 No   Admitted to ICU for COVID-19 No   Previously tested for COVID-19 Yes   Resident in a congregate (group) care setting No   Employed in healthcare setting No      11/29/19 0130   11/28/19 2306  HIV Antibody (routine testing w rflx)  (HIV Antibody (Routine testing w reflex) panel)  Once,   STAT     11/28/19 2306   11/28/19 2306  ABO/Rh  Once,   STAT     11/28/19 2306          Vitals/Pain Today's Vitals   11/29/19 0030 11/29/19 0100 11/29/19 0130 11/29/19 0200  BP: 108/69 103/66 110/69 105/70  Pulse: (!) 32  (!) 41 (!) 46  Resp: (!) 31 (!) 30 (!) 37 (!) 33  Temp:      TempSrc:      SpO2:  92%  93% (!) 89%  Weight:      Height:      PainSc:        Isolation Precautions Airborne and Contact precautions  Medications Medications  metoprolol succinate (TOPROL-XL) 24 hr tablet 100 mg (has no administration in time range)  amLODipine (NORVASC) tablet 10 mg (has no administration in time range)  atorvastatin (LIPITOR) tablet 20 mg (has no administration in time range)  enoxaparin (LOVENOX) injection 40 mg (has no administration in time range)  dexamethasone (DECADRON) injection 6 mg (has no administration in time range)  guaiFENesin-dextromethorphan (ROBITUSSIN DM) 100-10 MG/5ML syrup 10 mL (has no administration in time range)  ascorbic acid (VITAMIN C) tablet 500 mg (has no administration in time range)  zinc sulfate capsule 220 mg (has no administration in time range)  ondansetron (ZOFRAN) tablet 4 mg (has no administration in time range)    Or  ondansetron (ZOFRAN) injection 4 mg (has no administration in time range)  acetaminophen (TYLENOL) tablet 650 mg (has no administration in time range)  polyethylene glycol (MIRALAX / GLYCOLAX) packet 17 g (has no administration in time range)  0.9 % NaCl with KCl 20 mEq/ L  infusion ( Intravenous New Bag/Given 11/28/19 2317)  albuterol (VENTOLIN HFA) 108 (90 Base) MCG/ACT inhaler 1 puff (has no administration in time range)  cefTRIAXone (ROCEPHIN) 1 g in sodium chloride 0.9 % 100 mL IVPB (0 g Intravenous Stopped 11/28/19 2353)  azithromycin (ZITHROMAX) 500 mg in sodium chloride 0.9 % 250 mL IVPB (500 mg Intravenous New Bag/Given 11/29/19 0018)  dexamethasone (DECADRON) injection 6 mg (6 mg Intravenous Given 11/28/19 2100)    Mobility walks Low fall risk   Focused Assessments    R Recommendations: See Admitting Provider Note  Report given to:   Additional Notes:

## 2019-11-29 NOTE — Progress Notes (Signed)
MD Everlena Cooper made aware of patient's MEWS score:2 d/t increased respirations.

## 2019-11-29 NOTE — Progress Notes (Signed)
Patient Demographics:    Jack Cherry, is a 64 y.o. male, DOB - 1956/09/16, [id number]  Admit date - 11/28/2019   Admitting Physician Ejiroghene Arlyce Dice, MD  Outpatient Primary MD for the patient is Soyla Dryer, PA-C  LOS - 1   Chief Complaint  Patient presents with  . Shortness of Breath    Low 02 sats        Subjective:    Jack Cherry today has no fevers, no emesis,  No chest pain,  -Cough persist, shortness of breath improved, desaturation on room air with minimal ambulation down to 85 to 87%  Assessment  & Plan :    Principal Problem:   Acute respiratory failure with hypoxia due to Covid infection Active Problems:   Acute respiratory disease due to COVID-19 virus   Type 2 diabetes mellitus without complication (HCC)   Acute respiratory failure (HCC)   Essential hypertension, benign   AKI (acute kidney injury) [facility])  Brief Summary 64 y.o. male with medical history significant for hypertension, diabetes mellitus admitted on 11/27/2018 with confusion and found to have acute hypoxic respiratory failure secondary to COVID-19 infection  A/p 1)Acute hypoxic respiratory failure secondary to COVID-19 infection --Unable to wean off O2, desats with minimal ambulation to 85 to 87% on room air -Continue remdesivir and Decadron -Inflammatory markers noted -Continue mucolytics, bronchodilators and antitussives  2)AKI----acute kidney injury due to decreased oral intake in the setting of COVID-19 infection-   creatinine on admission=1.83  , baseline creatinine =1.2 (10/17/19)    , creatinine is now=1.63  , renally adjust medications, avoid nephrotoxic agents / dehydration  / hypotension -Continue to hold losartan and HCTZ  3)HTN--- stable, continue amlodipine and metoprolol, losartan/HCTZ on hold as above #2  4)DM2-last A1c 6.9 reflecting excellent DM control PTA, anticipate worsening  glycemic control with high-dose steroids -Continue to hold Metformin, Use Novolog/Humalog Sliding scale insulin with Accu-Cheks/Fingersticks as ordered    Disposition/Need for in-Hospital Stay- patient unable to be discharged at this time due to --- persistent hypoxia in the setting of COVID-19 infection requiring IV Decadron and IV remdesivir supplemental oxygen -Possible discharge home when clinically improved and hypoxia improves   Code Status : Full  Family Communication:   NA (patient is alert, awake and coherent)   Disposition Plan  :  Consults  :   na  DVT Prophylaxis  :  Lovenox  - SCDs   Lab Results  Component Value Date   PLT 309 11/29/2019    Inpatient Medications  Scheduled Meds: . amLODipine  10 mg Oral Daily  . vitamin C  500 mg Oral Daily  . atorvastatin  20 mg Oral Daily  . dexamethasone (DECADRON) injection  6 mg Intravenous Q24H  . enoxaparin (LOVENOX) injection  40 mg Subcutaneous Q24H  . feeding supplement (ENSURE ENLIVE)  237 mL Oral BID BM  . feeding supplement (PRO-STAT SUGAR FREE 64)  30 mL Oral BID  . metoprolol succinate  100 mg Oral Daily  . multivitamin with minerals  1 tablet Oral Daily  . pantoprazole  40 mg Oral Daily  . [START ON 11/30/2019] pneumococcal 23 valent vaccine  0.5 mL Intramuscular Tomorrow-1000  . zinc sulfate  220 mg Oral Daily   Continuous Infusions: .  azithromycin Stopped (11/29/19 0118)  . cefTRIAXone (ROCEPHIN)  IV Stopped (11/28/19 2353)  . [START ON 11/30/2019] remdesivir 100 mg in NS 100 mL     PRN Meds:.acetaminophen, albuterol, guaiFENesin-dextromethorphan, ondansetron **OR** ondansetron (ZOFRAN) IV, polyethylene glycol    Anti-infectives (From admission, onward)   Start     Dose/Rate Route Frequency Ordered Stop   11/30/19 1000  remdesivir 100 mg in sodium chloride 0.9 % 100 mL IVPB     100 mg 200 mL/hr over 30 Minutes Intravenous Daily 11/29/19 0842 12/04/19 0959   11/29/19 0900  remdesivir 200 mg in sodium  chloride 0.9% 250 mL IVPB     200 mg 580 mL/hr over 30 Minutes Intravenous Once 11/29/19 0842 11/29/19 1048   11/28/19 2315  cefTRIAXone (ROCEPHIN) 1 g in sodium chloride 0.9 % 100 mL IVPB     1 g 200 mL/hr over 30 Minutes Intravenous Every 24 hours 11/28/19 2308     11/28/19 2315  azithromycin (ZITHROMAX) 500 mg in sodium chloride 0.9 % 250 mL IVPB     500 mg 250 mL/hr over 60 Minutes Intravenous Every 24 hours 11/28/19 2308          Objective:   Vitals:   11/29/19 1347 11/29/19 1349 11/29/19 1524 11/29/19 1529  BP:  119/71    Pulse:  72 76 72  Resp:  20    Temp:  98 F (36.7 C)    TempSrc:  Oral    SpO2: 96% 90% (!) 88% 94%  Weight:      Height:        Wt Readings from Last 3 Encounters:  11/28/19 117.9 kg  07/19/19 118.2 kg  04/18/19 118 kg     Intake/Output Summary (Last 24 hours) at 11/29/2019 1906 Last data filed at 11/29/2019 1700 Gross per 24 hour  Intake 2054.16 ml  Output 650 ml  Net 1404.16 ml     Physical Exam  Gen:- Awake Alert, conversational dyspnea HEENT:- [place].AT, No sclera icterus Nose- [place] 2 L/min Neck-Supple Neck,No JVD,.  Lungs-diminished breath sounds with scattered wheezes  CV- S1, S2 normal, regular  Abd-  +ve B.Sounds, Abd Soft, No tenderness,    Extremity/Skin:- No  edema, pedal pulses present  Psych-affect is appropriate, oriented x3 Neuro-no new focal deficits, no tremors   Data Review:   Micro Results Recent Results (from the past 240 hour(s))  Respiratory Panel by RT PCR (Flu A&B, Covid) - Nasopharyngeal Swab     Status: Abnormal   Collection Time: 11/29/19  1:30 AM   Specimen: Nasopharyngeal Swab  Result Value Ref Range Status   SARS Coronavirus 2 by RT PCR POSITIVE (A) NEGATIVE Final    Comment: RESULT CALLED TO, READ BACK BY AND VERIFIED WITH: T EASTER,RN @0235  11/29/19 MKELLY (NOTE) SARS-CoV-2 target nucleic acids are DETECTED. SARS-CoV-2 RNA is generally detectable in upper respiratory specimens  during the acute  phase of infection. Positive results are indicative of the presence of the identified virus, but do not rule out bacterial infection or co-infection with other pathogens not detected by the test. Clinical correlation with patient history and other diagnostic information is necessary to determine patient infection status. The expected result is Negative. Fact Sheet for Patients:  PinkCheek.be Fact Sheet for Healthcare Providers: GravelBags.it This test is not yet approved or cleared by the [place] FDA and  has been authorized for detection and/or diagnosis of SARS-CoV-2 by FDA under an Emergency Use Authorization (EUA).  This EUA will remain in effect (meaning  this test can be used) for  the duration of  the COVID-19 declaration under Section 564(b)(1) of the Act, 21 U.S.C. section 360bbb-3(b)(1), unless the authorization is terminated or revoked sooner.    Influenza A by PCR NEGATIVE NEGATIVE Final   Influenza B by PCR NEGATIVE NEGATIVE Final    Comment: (NOTE) The Xpert Xpress SARS-CoV-2/FLU/RSV assay is intended as an aid in  the diagnosis of influenza from Nasopharyngeal swab specimens and  should not be used as a sole basis for treatment. Nasal washings and  aspirates are unacceptable for Xpert Xpress SARS-CoV-2/FLU/RSV  testing. Fact Sheet for Patients: PinkCheek.be Fact Sheet for Healthcare Providers: GravelBags.it This test is not yet approved or cleared by the [place] FDA and  has been authorized for detection and/or diagnosis of SARS-CoV-2 by  FDA under an Emergency Use Authorization (EUA). This EUA will remain  in effect (meaning this test can be used) for the duration of the  Covid-19 declaration under Section 564(b)(1) of the Act, 21  U.S.C. section 360bbb-3(b)(1), unless the authorization is  terminated or revoked. Performed at [facility], [address] [place], [place] [address]     Radiology Reports DG Chest Portable 1 View  Result Date: 11/28/2019 CLINICAL DATA:  Shortness of breath EXAM: PORTABLE CHEST 1 VIEW COMPARISON:  12/26/2015 FINDINGS: Diffuse bilateral airspace disease concerning for infection. COVID pneumonia could have this appearance. Heart is normal size. No effusions or acute bony abnormality. IMPRESSION: Diffuse bilateral airspace disease concerning for pneumonia. COVID pneumonia could have this appearance. Electronically Signed   By: Rolm Baptise M.D.   On: 11/28/2019 19:31     CBC Recent Labs  Lab 11/28/19 1923 11/29/19 0657  WBC 4.4 3.9*  HGB 11.3* 10.7*  HCT 34.0* 33.1*  PLT 318 309  MCV 92.4 95.1  MCH 30.7 30.7  MCHC 33.2 32.3  RDW 14.1 14.5  LYMPHSABS 0.6* 0.4*  MONOABS 0.1 0.1  EOSABS 0.0 0.0  BASOSABS 0.0 0.0    Chemistries  Recent Labs  Lab 11/28/19 1923 11/29/19 0657  NA 133* 139  K 3.5 4.4  CL 99 102  CO2 25 25  GLUCOSE 112* 143*  BUN 58* 59*  CREATININE 1.83* 1.63*  CALCIUM 8.7* 8.8*  AST 63* 53*  ALT 33 29  ALKPHOS 44 43  BILITOT 0.9 0.7   ------------------------------------------------------------------------------------------------------------------ No results for input(s): CHOL, HDL, LDLCALC, TRIG, CHOLHDL, LDLDIRECT in the last 72 hours.  Lab Results  Component Value Date   HGBA1C 6.9 (H) 10/17/2019   ------------------------------------------------------------------------------------------------------------------ No results for input(s): TSH, T4TOTAL, T3FREE, THYROIDAB in the last 72 hours.  Invalid input(s): FREET3 ------------------------------------------------------------------------------------------------------------------ Recent Labs    11/28/19 1923 11/29/19 0657  FERRITIN 1,909* 1,742*    Coagulation profile No results for input(s): INR, PROTIME in the last 168 hours.  Recent Labs    11/28/19 1923 11/29/19 0657  DDIMER 3.30* 4.94*     Cardiac Enzymes No results for input(s): CKMB, TROPONINI, MYOGLOBIN in the last 168 hours.  Invalid input(s): CK ------------------------------------------------------------------------------------------------------------------    Component Value Date/Time   BNP 40.0 11/28/2019 1923     Roxan Hockey M.D on 11/29/2019 at 7:06 PM  Go to [email or web address] - for contact info  [facility] - Office  [phone]

## 2019-11-29 NOTE — Progress Notes (Signed)
Initial Nutrition Assessment  DOCUMENTATION CODES:   Obesity unspecified  INTERVENTION:  -Ensure Enlive po BID, each supplement provides 350 kcal and 20 grams of protein  -Prostat 30 ml po BID, each supplement provides 100 kcal and 15 grams of protein  -MVI with minerals daily   NUTRITION DIAGNOSIS:   Increased nutrient needs related to catabolic illness(acute respiratory failure secondary to Covid-19 virus infection) as evidenced by estimated needs.  GOAL:   Patient will meet greater than or equal to 90% of their needs  MONITOR:   PO intake, Supplement acceptance, Labs, Weight trends, I & O's  REASON FOR ASSESSMENT:   Malnutrition Screening Tool    ASSESSMENT:  RD working remotely.  64 year old male with past medical history of HTN, DM, and HLD who presented to ED via EMS with confusion and cough. Patient reported dry cough over the past few days, O2 sats 84-85% on room air upon EMS arrival, pt placed on 4 L with sat increase to 94%. In ED, pt temperature 98.9, POC COVID-19 negative, elevated inflammatory makers and CXR consistent with COVID-19 pneumonia.   1/26 Covid positive Per chart, pt sats 97% on 2 L   RD attempted to reach pt via phone this afternoon, pt did not pick up. Unable to obtain nutrition history at this time. Patient eating 100% x 1 documented meal this admission. Will continue to monitor po intake and provide Ensure and Prostat nutrition supplements to aid with increased calorie/protein needs.  Current wt 259.38 lbs Weight stable over the past year per history  Medications reviewed and include: Vitamin C, Decadron, Protonix, Zinc sulfate, Zithromax, Remdesivir  Labs: BG 143, Cr 1.43 (H), BUN 59 (H), WBC 3.9 (L), CRP 18.9 (H) Lab Results  Component Value Date   HGBA1C 6.9 (H) 10/17/2019    NUTRITION - FOCUSED PHYSICAL EXAM: Unable to complete at this time, RD working remotely secondary to COVID-19 positive pt.  Diet Order:   Diet Order           Diet heart healthy/carb modified Room service appropriate? Yes; Fluid consistency: Thin  Diet effective now              EDUCATION NEEDS:   No education needs have been identified at this time  Skin:  Skin Assessment: Reviewed RN Assessment  Last BM:  1/26  Height:   Ht Readings from Last 1 Encounters:  11/29/19 6\' 1"  (1.854 m)    Weight:   Wt Readings from Last 1 Encounters:  11/28/19 117.9 kg    Ideal Body Weight:  83.6 kg  BMI:  Body mass index is 34.3 kg/m.  Estimated Nutritional Needs:   Kcal:  [id number]  Protein:  142-165 (1.7-2g/kg/IBW)  Fluid:  >/= 2.2 L/day    Lajuan Lines, RD, LDN Clinical Nutrition Office [phone] After Hours/Weekend Pager: [phone]

## 2019-11-29 NOTE — Progress Notes (Signed)
Attempt to wean from o2 today several times but dropped to high 80's on room air. Ambulated in room several times and sat in chair.  Denies SOB

## 2019-11-30 LAB — COMPREHENSIVE METABOLIC PANEL
ALT: 33 U/L (ref 0–44)
AST: 49 U/L — ABNORMAL HIGH (ref 15–41)
Albumin: 2.8 g/dL — ABNORMAL LOW (ref 3.5–5.0)
Alkaline Phosphatase: 40 U/L (ref 38–126)
Anion gap: 9 (ref 5–15)
BUN: 51 mg/dL — ABNORMAL HIGH (ref 8–23)
CO2: 25 mmol/L (ref 22–32)
Calcium: 8.9 mg/dL (ref 8.9–10.3)
Chloride: 105 mmol/L (ref 98–111)
Creatinine, Ser: 1.18 mg/dL (ref 0.61–1.24)
GFR calc Af Amer: >60 mL/min (ref >60)
GFR calc non Af Amer: >60 mL/min (ref >60)
Glucose, Bld: 121 mg/dL — ABNORMAL HIGH (ref 70–99)
Potassium: 4.1 mmol/L (ref 3.5–5.1)
Sodium: 139 mmol/L (ref 135–145)
Total Bilirubin: 0.5 mg/dL (ref 0.3–1.2)
Total Protein: 6.8 g/dL (ref 6.5–8.1)

## 2019-11-30 LAB — CBC WITH DIFFERENTIAL/PLATELET
Abs Immature Granulocytes: 0.38 10*3/uL — ABNORMAL HIGH (ref 0.00–0.07)
Basophils Absolute: 0 10*3/uL (ref 0.0–0.1)
Basophils Relative: 0 %
Eosinophils Absolute: 0 10*3/uL (ref 0.0–0.5)
Eosinophils Relative: 0 %
HCT: 33.3 % — ABNORMAL LOW (ref 39.0–52.0)
Hemoglobin: 10.7 g/dL — ABNORMAL LOW (ref 13.0–17.0)
Immature Granulocytes: 6 %
Lymphocytes Relative: 12 %
Lymphs Abs: 0.7 10*3/uL (ref 0.7–4.0)
MCH: 30.7 pg (ref 26.0–34.0)
MCHC: 32.1 g/dL (ref 30.0–36.0)
MCV: 95.7 fL (ref 80.0–100.0)
Monocytes Absolute: 0.4 10*3/uL (ref 0.1–1.0)
Monocytes Relative: 6 %
Neutro Abs: 4.8 10*3/uL (ref 1.7–7.7)
Neutrophils Relative %: 76 %
Platelets: 373 10*3/uL (ref 150–400)
RBC: 3.48 MIL/uL — ABNORMAL LOW (ref 4.22–5.81)
RDW: 14.6 % (ref 11.5–15.5)
WBC: 6.3 10*3/uL (ref 4.0–10.5)
nRBC: 0.5 % — ABNORMAL HIGH (ref 0.0–0.2)

## 2019-11-30 LAB — C-REACTIVE PROTEIN: CRP: 11.7 mg/dL — ABNORMAL HIGH (ref <1.0)

## 2019-11-30 LAB — GLUCOSE, CAPILLARY
Glucose-Capillary: 108 mg/dL — ABNORMAL HIGH (ref 70–99)
Glucose-Capillary: 153 mg/dL — ABNORMAL HIGH (ref 70–99)

## 2019-11-30 LAB — D-DIMER, QUANTITATIVE: D-Dimer, Quant: 3.13 ug/mL-FEU — ABNORMAL HIGH (ref 0.00–0.50)

## 2019-11-30 LAB — FERRITIN: Ferritin: 2050 ng/mL — ABNORMAL HIGH (ref 24–336)

## 2019-11-30 MED ORDER — LOSARTAN POTASSIUM 100 MG PO TABS: 100.0000 mg | ORAL_TABLET | Freq: Every day | ORAL | 3 refills | Status: DC

## 2019-11-30 MED ORDER — GUAIFENESIN ER 600 MG PO TB12: 600.0000 mg | ORAL_TABLET | Freq: Two times a day (BID) | ORAL | 0 refills | Status: AC

## 2019-11-30 MED ORDER — ALBUTEROL SULFATE HFA 108 (90 BASE) MCG/ACT IN AERS: 1.0000 | INHALATION_SPRAY | RESPIRATORY_TRACT | 0 refills | Status: DC | PRN

## 2019-11-30 MED ORDER — AMLODIPINE BESYLATE 10 MG PO TABS: 10.0000 mg | ORAL_TABLET | Freq: Every day | ORAL | 3 refills | Status: DC

## 2019-11-30 MED ORDER — ASCORBIC ACID 500 MG PO TABS: 500.0000 mg | ORAL_TABLET | Freq: Every day | ORAL | 3 refills | Status: DC

## 2019-11-30 MED ORDER — PREDNISONE 20 MG PO TABS: 40.0000 mg | ORAL_TABLET | Freq: Every day | ORAL | 0 refills | Status: DC

## 2019-11-30 MED ORDER — ZINC SULFATE 220 (50 ZN) MG PO CAPS: 220.0000 mg | ORAL_CAPSULE | Freq: Every day | ORAL | 1 refills | Status: DC

## 2019-11-30 MED ORDER — METOPROLOL SUCCINATE ER 100 MG PO TB24: 100.0000 mg | ORAL_TABLET | Freq: Every day | ORAL | 3 refills | Status: DC

## 2019-11-30 MED ORDER — ATORVASTATIN CALCIUM 20 MG PO TABS: 20.0000 mg | ORAL_TABLET | Freq: Every day | ORAL | 3 refills | Status: DC

## 2019-11-30 MED ORDER — ADULT MULTIVITAMIN W/MINERALS CH: 1.0000 | ORAL_TABLET | Freq: Every day | ORAL | 2 refills | Status: DC

## 2019-11-30 MED ORDER — METFORMIN HCL ER 500 MG PO TB24: 500.0000 mg | ORAL_TABLET | ORAL | 3 refills | Status: DC

## 2019-11-30 NOTE — Discharge Instructions (Addendum)
You are scheduled for an outpatient infusion of Remdesivir at 10:00 am on Thursday 1/28, Friday 1/29 and Saturday 1/30.    Please report to [facility] at [address].  Drive to the security guard and tell them you are here for an infusion. They will direct you to the front entrance where we will come and get you.  For questions call [phone].  Thanks   -1)Patient scheduled for outpatient Remdesivir infusion at 10:00 am on Thursday 1/28, Friday 1/29 and Saturday 1/30.   Please advise them to report to [facility] at [address]  Drive to the security guard and tell them you are here for an infusion. They will direct you to the front entrance where we will come and get you.  For questions call [phone]  2) your Metformin has been adjusted as follows --take 1 tablet twice a day for 1 week through 12/07/2019 then go back to 1 tablet every morning with food starting on 12/08/2019  3)Take the rest of the medications as prescribed  4)Avoid ibuprofen/Advil/Aleve/Motrin/Goody Powders/Naproxen/BC powders/Meloxicam/Diclofenac/Indomethacin and other Nonsteroidal anti-inflammatory medications as these will make you more likely to bleed and can cause stomach ulcers, can also cause Kidney problems.

## 2019-11-30 NOTE — Progress Notes (Signed)
Pt post ambulation saturation 90% on room air. Ambulated in room and bathroom. Upon rest increased to 95% on room air. Denies shortness of breath no s/s of acute distress noted.

## 2019-11-30 NOTE — Discharge Summary (Signed)
Jack Cherry, is a 64 y.o. male  DOB [birth year]  MRN [id number].  Admission date:  11/28/2019  Admitting Physician  Bethena Roys, MD  Discharge Date:  11/30/2019   Primary MD  Soyla Dryer, PA-C  Recommendations for primary care physician for things to follow:   1)Patient scheduled for outpatient Remdesivir infusion at 10:00 am on Thursday 1/28, Friday 1/29 and Saturday 1/30.   Please advise them to report to [facility] at [address].  Drive to the security guard and tell them you are here for an infusion. They will direct you to the front entrance where we will come and get you.  For questions call [phone]  2)Your Metformin has been adjusted as follows --take 1 tablet twice a day for 1 week through 12/07/2019 then go back to 1 tablet every morning with food starting on 12/08/2019  3)Take the rest of the medications as prescribed  4)Avoid ibuprofen/Advil/Aleve/Motrin/Goody Powders/Naproxen/BC powders / Meloxicam / Diclofenac / Indomethacin and other Nonsteroidal anti-inflammatory medications as these will make you more likely to bleed and can cause stomach ulcers, can also cause Kidney problems.   Admission Diagnosis  Acute respiratory failure (HCC) [J96.00] Hypoxia [R09.02] Suspected COVID-19 virus infection [Z20.822]   Discharge Diagnosis  Acute respiratory failure ([place]) [J96.00] Hypoxia [R09.02] Suspected COVID-19 virus infection [Z20.822]   Principal Problem:   Acute respiratory failure with hypoxia due to Covid infection Active Problems:   Acute respiratory disease due to COVID-19 virus   Type 2 diabetes mellitus without complication (HCC)   Acute respiratory failure (HCC)   Essential hypertension, benign   AKI (acute kidney injury) ([place])      Past Medical History:  Diagnosis Date  . 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 Diabetes ([place])   . High cholesterol   . Hypertension     Past Surgical History:  Procedure Laterality Date  . COLONOSCOPY N/A 01/08/2018   Dr. Oneida Alar three 3-8 mm pol;yps, simple adenomas. internal hemorrhoids. next colonoscopy in 3 years  . ESOPHAGOGASTRODUODENOSCOPY N/A 01/08/2018   Dr. Oneida Alar: normal esophagus, mild gastritis/duodenitis, H.pylori  . GIVENS CAPSULE STUDY N/A 12/21/2018   Procedure: GIVENS CAPSULE STUDY;  Surgeon: Danie Binder, MD;  Location: [facility];  Service: Endoscopy;  Laterality: N/A;  7:30am  . HERNIA REPAIR         HPI  from the history and physical done on the day of admission:    - Chief Complaint: Cough, confusion  HPI: Jack Cherry is a 64 y.o. male with medical history significant for hypertension, diabetes mellitus.  Patient was brought to the ED via EMS for reports of patient 'not acting right" per family.  Patient's niece checks patient's O2 sats and they were 84 to 85% on room air, he was placed on 4 L by EMS sats increased to 94%. On my evaluation patient is awake and alert, answers questions appropriately.  Reports a dry cough over the past few days, denies  difficulty breathing.  No fever no chills.  No known Covid positive contacts.  ED Course: Temperature 98.9.  Tachypneic.  O2 sats 89% on room air, initially placed on 4 L, reduced to 3 L O2 sats greater than 95%.  POC COVID-19 test negative.  Elevation in inflammatory markers D-dimer, CRP, LDH, fibrinogen. Chest x-ray consistent with COVID-19 pneumonia.  Dexamethasone given in ED.  Review of Systems: As per HPI all other systems reviewed and negative.   Hospital Course:    Brief Summary 64 y.o.malewith medical history significant forhypertension, diabetes mellitus admitted on 11/27/2018 with confusion and found to have acute hypoxic respiratory failure secondary to COVID-19 infection  A/p 1)Acute hypoxic respiratory failure secondary to COVID-19  infection --Respiratory status is improved, hypoxia has resolved -Treated with remdesivir and Decadron -Patient will complete IV remdesivir as outpatient -Okay to discharge home on p.o. prednisone -Inflammatory markers noted -D-dimer is down to 3.1 from a peak of 4.9 -Down to 11.7 from a peak of 18.9 -Continue mucolytics, bronchodilators and antitussives  2)AKI----acute kidney injury due to decreased oral intake in the setting of COVID-19 infection-   creatinine on admission=1.83  , baseline creatinine =1.2 (10/17/19)    , creatinine is now=1.1  , renally adjust medications, avoid nephrotoxic agents / dehydration  / hypotension -Continue to restart losartan -Discontinued HCTZ -BUN elevated due to steroids  3)HTN--- stable, continue amlodipine and metoprolol,  discontinued HCTZ okay to restart losartan  4)DM2-last A1c 6.9 reflecting excellent DM control PTA, anticipate worsening glycemic control with high-dose steroids -Metformin adjusted as in discharge instructions    Disposition-from a respiratory standpoint much improved, no significant hypoxia or dyspnea on exertion -Okay to discharge home to complete outpatient IV remdesivir infusions Discharge Condition: Stable  Follow UP-video/virtual follow-up with PCP  Diet and Activity recommendation:  As advised  Discharge Instructions    Discharge Instructions    Call MD for:  difficulty breathing, headache or visual disturbances   Complete by: As directed    Call MD for:  extreme fatigue   Complete by: As directed    Call MD for:  persistant dizziness or light-headedness   Complete by: As directed    Call MD for:  persistant nausea and vomiting   Complete by: As directed    Call MD for:  severe uncontrolled pain   Complete by: As directed    Call MD for:  temperature >100.4   Complete by: As directed    Diet - low sodium heart healthy   Complete by: As directed    Diet Carb Modified   Complete by: As directed     Discharge instructions   Complete by: As directed    1)Patient scheduled for outpatient Remdesivir infusion at 10:00 am on Thursday 1/28, Friday 1/29 and Saturday 1/30.   Please advise them to report to [facility] at [address]  Drive to the security guard and tell them you are here for an infusion. They will direct you to the front entrance where we will come and get you.  For questions call [phone]  2) your Metformin has been adjusted as follows --take 1 tablet twice a day for 1 week through 12/07/2019 then go back to 1 tablet every morning with food starting on 12/08/2019  3)Take the rest of the medications as prescribed  4)Avoid ibuprofen/Advil/Aleve/Motrin/Goody Powders/Naproxen/BC powders/Meloxicam/Diclofenac/Indomethacin and other Nonsteroidal anti-inflammatory medications as these will make you more likely to bleed and can cause stomach ulcers, can also cause Kidney problems.  Increase activity slowly   Complete by: As directed         Discharge Medications     Allergies as of 11/30/2019   No Known Allergies     Medication List    STOP taking these medications   hydrochlorothiazide 25 MG tablet Commonly known as: HYDRODIURIL     TAKE these medications   albuterol 108 (90 Base) MCG/ACT inhaler Commonly known as: VENTOLIN HFA Inhale 1 puff into the lungs every 4 (four) hours as needed for wheezing or shortness of breath.   amLODipine 10 MG tablet Commonly known as: NORVASC Take 1 tablet (10 mg total) by mouth daily. for blood pressure   ascorbic acid 500 MG tablet Commonly known as: VITAMIN C Take 1 tablet (500 mg total) by mouth daily. Start taking on: December 01, 2019   atorvastatin 20 MG tablet Commonly known as: LIPITOR Take 1 tablet (20 mg total) by mouth daily.   Fish Oil 1000 MG Caps Take 1 capsule (1,000 mg total) by mouth daily.   guaiFENesin 600 MG 12 hr tablet Commonly known as: Mucinex Take 1 tablet (600 mg total) by mouth 2  (two) times daily for 10 days.   loratadine 10 MG tablet Commonly known as: CLARITIN Take 1 tablet (10 mg total) by mouth daily as needed.   losartan 100 MG tablet Commonly known as: COZAAR Take 1 tablet (100 mg total) by mouth daily.   metFORMIN 500 MG 24 hr tablet Commonly known as: Glucophage XR Take 1 tablet (500 mg total) by mouth See admin instructions. Take 1 tablet twice a day for 1 week through 12/07/2019 then go back to 1 tablet every morning with food starting on 12/08/2019 What changed:   when to take this  additional instructions   metoprolol succinate 100 MG 24 hr tablet Commonly known as: TOPROL-XL Take 1 tablet (100 mg total) by mouth daily. Take with or immediately following a meal.   multivitamin with minerals Tabs tablet Take 1 tablet by mouth daily. Start taking on: December 01, 2019   omeprazole 20 MG capsule Commonly known as: PRILOSEC TAKE 1 Capsule BY MOUTH EVERY MORNING 30 MINUTES BEFORE BREAKFAST   predniSONE 20 MG tablet Commonly known as: Deltasone Take 2 tablets (40 mg total) by mouth daily with breakfast.   zinc sulfate 220 (50 Zn) MG capsule Take 1 capsule (220 mg total) by mouth daily. Start taking on: December 01, 2019       Major procedures and Radiology Reports - PLEASE review detailed and final reports for all details, in brief -   DG Chest Portable 1 View  Result Date: 11/28/2019 CLINICAL DATA:  Shortness of breath EXAM: PORTABLE CHEST 1 VIEW COMPARISON:  12/26/2015 FINDINGS: Diffuse bilateral airspace disease concerning for infection. COVID pneumonia could have this appearance. Heart is normal size. No effusions or acute bony abnormality. IMPRESSION: Diffuse bilateral airspace disease concerning for pneumonia. COVID pneumonia could have this appearance. Electronically Signed   By: Rolm Baptise M.D.   On: 11/28/2019 19:31    Micro Results    Recent Results (from the past 240 hour(s))  Respiratory Panel by RT PCR (Flu A&B, Covid) -  Nasopharyngeal Swab     Status: Abnormal   Collection Time: 11/29/19  1:30 AM   Specimen: Nasopharyngeal Swab  Result Value Ref Range Status   SARS Coronavirus 2 by RT PCR POSITIVE (A) NEGATIVE Final    Comment: RESULT CALLED TO, READ BACK BY AND VERIFIED WITH: T EASTER,RN @0235   11/29/19 MKELLY (NOTE) SARS-CoV-2 target nucleic acids are DETECTED. SARS-CoV-2 RNA is generally detectable in upper respiratory specimens  during the acute phase of infection. Positive results are indicative of the presence of the identified virus, but do not rule out bacterial infection or co-infection with other pathogens not detected by the test. Clinical correlation with patient history and other diagnostic information is necessary to determine patient infection status. The expected result is Negative. Fact Sheet for Patients:  PinkCheek.be Fact Sheet for Healthcare Providers: GravelBags.it This test is not yet approved or cleared by the [place] FDA and  has been authorized for detection and/or diagnosis of SARS-CoV-2 by FDA under an Emergency Use Authorization (EUA).  This EUA will remain in effect (meaning this test can be used) for  the duration of  the COVID-19 declaration under Section 564(b)(1) of the Act, 21 U.S.C. section 360bbb-3(b)(1), unless the authorization is terminated or revoked sooner.    Influenza A by PCR NEGATIVE NEGATIVE Final   Influenza B by PCR NEGATIVE NEGATIVE Final    Comment: (NOTE) The Xpert Xpress SARS-CoV-2/FLU/RSV assay is intended as an aid in  the diagnosis of influenza from Nasopharyngeal swab specimens and  should not be used as a sole basis for treatment. Nasal washings and  aspirates are unacceptable for Xpert Xpress SARS-CoV-2/FLU/RSV  testing. Fact Sheet for Patients: PinkCheek.be Fact Sheet for Healthcare Providers: GravelBags.it This test  is not yet approved or cleared by the [place] FDA and  has been authorized for detection and/or diagnosis of SARS-CoV-2 by  FDA under an Emergency Use Authorization (EUA). This EUA will remain  in effect (meaning this test can be used) for the duration of the  Covid-19 declaration under Section 564(b)(1) of the Act, 21  U.S.C. section 360bbb-3(b)(1), unless the authorization is  terminated or revoked. Performed at [facility], [address], [place], [place] [address]        Today   Subjective    Jack Cherry today has no new complaints -Ambulating around without significant dyspnea on exertion, no chest pains palpitations or dizziness with ambulation          Patient has been seen and examined prior to discharge   Objective   Blood pressure 120/69, pulse (!) 56, temperature 97.7 F (36.5 C), temperature source Oral, resp. rate (!) 22, height 6\' 1"  (1.854 m), weight 117.9 kg, SpO2 95 %.   Intake/Output Summary (Last 24 hours) at 11/30/2019 1612 Last data filed at 11/30/2019 0700 Gross per 24 hour  Intake 480 ml  Output 700 ml  Net -220 ml    Exam Gen:- Awake Alert, no acute distress , speaking in complete sentences HEENT:- [place].AT,  No sclera icterus Neck-Supple Neck,No JVD,.  Lungs-improved air movement, no wheezing CV- S1, S2 normal, regular Abd-  +ve B.Sounds, Abd Soft, No tenderness,    Extremity/Skin:- No  edema,   good pulses Psych-affect is appropriate, oriented x3 Neuro-no new focal deficits, no tremors    Data Review   CBC w Diff:  Lab Results  Component Value Date   WBC 6.3 11/30/2019   HGB 10.7 (L) 11/30/2019   HCT 33.3 (L) 11/30/2019   PLT 373 11/30/2019   LYMPHOPCT 12 11/30/2019   MONOPCT 6 11/30/2019   EOSPCT 0 11/30/2019   BASOPCT 0 11/30/2019    CMP:  Lab Results  Component Value Date   NA 139 11/30/2019   K 4.1 11/30/2019   CL 105 11/30/2019   CO2 25 11/30/2019  BUN 51 (H) 11/30/2019   CREATININE 1.18 11/30/2019    CREATININE 1.01 11/11/2016   PROT 6.8 11/30/2019   ALBUMIN 2.8 (L) 11/30/2019   BILITOT 0.5 11/30/2019   ALKPHOS 40 11/30/2019   AST 49 (H) 11/30/2019   ALT 33 11/30/2019  .   Total Discharge time is about 33 minutes  Roxan Hockey M.D on 11/30/2019 at 4:12 PM  Go to [email or web address] -  for contact info  [facility] - Office  [phone]

## 2019-11-30 NOTE — Clinical Social Work Note (Signed)
Transportation services arranged for patient to make his 10:00 a.m. appointments on 1/28, 1/29 and 1/30.  Transportation to notify patients nurse with the time they will be picking patient up each day.     Koltan Portocarrero, Clydene Pugh, LCSW

## 2019-11-30 NOTE — Progress Notes (Signed)
Patient scheduled for outpatient Remdesivir infusion at 10:00 am on Thursday 1/28, Friday 1/29 and Saturday 1/30.   Please advise them to report to [facility] at [address]  Drive to the security guard and tell them you are here for an infusion. They will direct you to the front entrance where we will come and get you.  For questions call [phone].  Thanks

## 2019-11-30 NOTE — Progress Notes (Signed)
D/c off floor via wheelchair with nurse A&O x4 no s/s of distress or complaints at steady no SOB noted. Verbalized understanding of dc instructions.

## 2019-12-01 ENCOUNTER — Ambulatory Visit (HOSPITAL_COMMUNITY)
Admission: RE | Admit: 2019-12-01 | Discharge: 2019-12-01 | Disposition: A | Discharge status: Home / Self Care | Payer: Self-pay | Source: Ambulatory Visit | Attending: Pulmonary Disease | Admitting: Pulmonary Disease

## 2019-12-01 DIAGNOSIS — J1289 Other viral pneumonia: Secondary | ICD-10-CM | POA: Insufficient documentation

## 2019-12-01 DIAGNOSIS — U071 COVID-19: Secondary | ICD-10-CM | POA: Insufficient documentation

## 2019-12-01 MED ORDER — SODIUM CHLORIDE 0.9 % IV SOLN: INTRAVENOUS | Status: DC | PRN

## 2019-12-01 MED ORDER — EPINEPHRINE 0.3 MG/0.3ML IJ SOAJ: 0.3000 mg | Freq: Once | INTRAMUSCULAR | Status: DC | PRN

## 2019-12-01 MED ORDER — ALBUTEROL SULFATE HFA 108 (90 BASE) MCG/ACT IN AERS: 2.0000 | INHALATION_SPRAY | Freq: Once | RESPIRATORY_TRACT | Status: DC | PRN

## 2019-12-01 MED ORDER — DIPHENHYDRAMINE HCL 50 MG/ML IJ SOLN: 50.0000 mg | Freq: Once | INTRAMUSCULAR | Status: DC | PRN

## 2019-12-01 MED ORDER — FAMOTIDINE IN NACL 20-0.9 MG/50ML-% IV SOLN: 20.0000 mg | Freq: Once | INTRAVENOUS | Status: DC | PRN

## 2019-12-01 MED ORDER — METHYLPREDNISOLONE SODIUM SUCC 125 MG IJ SOLR: 125.0000 mg | Freq: Once | INTRAMUSCULAR | Status: DC | PRN

## 2019-12-01 MED ORDER — SODIUM CHLORIDE 0.9 % IV SOLN: 100.0000 mg | Freq: Once | INTRAVENOUS | Status: DC

## 2019-12-01 MED ORDER — SODIUM CHLORIDE 0.9 % IV SOLN
INTRAVENOUS | Status: AC
  Administered 2019-12-01: 100 mg via INTRAVENOUS
  Filled 2019-12-01: qty 20

## 2019-12-01 NOTE — Progress Notes (Signed)
  Diagnosis: COVID-19  Physician:dr wright   Procedure: [facility] Infusion Clinic Med: remdesivir infusion.  Complications: No immediate complications noted.  Discharge: Discharged home   Melaya Hoselton S Jacelyn Cuen 12/01/2019   

## 2019-12-01 NOTE — Discharge Instructions (Signed)
COVID-19 COVID-19 is a respiratory infection that is caused by a virus called severe acute respiratory syndrome coronavirus 2 (SARS-CoV-2). The disease is also known as coronavirus disease or novel coronavirus. In some people, the virus may not cause any symptoms. In others, it may cause a serious infection. The infection can get worse quickly and can lead to complications, such as:  Pneumonia, or infection of the lungs.  Acute respiratory distress syndrome or ARDS. This is a condition in which fluid build-up in the lungs prevents the lungs from filling with air and passing oxygen into the blood.  Acute respiratory failure. This is a condition in which there is not enough oxygen passing from the lungs to the body or when carbon dioxide is not passing from the lungs out of the body.  Sepsis or septic shock. This is a serious bodily reaction to an infection.  Blood clotting problems.  Secondary infections due to bacteria or fungus.  Organ failure. This is when your body's organs stop working. The virus that causes COVID-19 is contagious. This means that it can spread from person to person through droplets from coughs and sneezes (respiratory secretions). What are the causes? This illness is caused by a virus. You may catch the virus by:  Breathing in droplets from an infected person. Droplets can be spread by a person breathing, speaking, singing, coughing, or sneezing.  Touching something, like a table or a doorknob, that was exposed to the virus (contaminated) and then touching your mouth, nose, or eyes. What increases the risk? Risk for infection You are more likely to be infected with this virus if you:  Are within 6 feet (2 meters) of a person with COVID-19.  Provide care for or live with a person who is infected with COVID-19.  Spend time in crowded indoor spaces or live in shared housing. Risk for serious illness You are more likely to become seriously ill from the virus if  you:  Are 50 years of age or older. The higher your age, the more you are at risk for serious illness.  Live in a nursing home or long-term care facility.  Have cancer.  Have a long-term (chronic) disease such as: ? Chronic lung disease, including chronic obstructive pulmonary disease or asthma. ? A long-term disease that lowers your body's ability to fight infection (immunocompromised). ? Heart disease, including heart failure, a condition in which the arteries that lead to the heart become narrow or blocked (coronary artery disease), a disease which makes the heart muscle thick, weak, or stiff (cardiomyopathy). ? Diabetes. ? Chronic kidney disease. ? Sickle cell disease, a condition in which red blood cells have an abnormal "sickle" shape. ? Liver disease.  Are obese. What are the signs or symptoms? Symptoms of this condition can range from mild to severe. Symptoms may appear any time from 2 to 14 days after being exposed to the virus. They include:  A fever or chills.  A cough.  Difficulty breathing.  Headaches, body aches, or muscle aches.  Runny or stuffy (congested) nose.  A sore throat.  New loss of taste or smell. Some people may also have stomach problems, such as nausea, vomiting, or diarrhea. Other people may not have any symptoms of COVID-19. How is this diagnosed? This condition may be diagnosed based on:  Your signs and symptoms, especially if: ? You live in an area with a COVID-19 outbreak. ? You recently traveled to or from an area where the virus is common. ? You   provide care for or live with a person who was diagnosed with COVID-19. ? You were exposed to a person who was diagnosed with COVID-19.  A physical exam.  Lab tests, which may include: ? Taking a sample of fluid from the back of your nose and throat (nasopharyngeal fluid), your nose, or your throat using a swab. ? A sample of mucus from your lungs (sputum). ? Blood tests.  Imaging tests,  which may include, X-rays, CT scan, or ultrasound. How is this treated? At present, there is no medicine to treat COVID-19. Medicines that treat other diseases are being used on a trial basis to see if they are effective against COVID-19. Your health care provider will talk with you about ways to treat your symptoms. For most people, the infection is mild and can be managed at home with rest, fluids, and over-the-counter medicines. Treatment for a serious infection usually takes places in a hospital intensive care unit (ICU). It may include one or more of the following treatments. These treatments are given until your symptoms improve.  Receiving fluids and medicines through an IV.  Supplemental oxygen. Extra oxygen is given through a tube in the nose, a face mask, or a hood.  Positioning you to lie on your stomach (prone position). This makes it easier for oxygen to get into the lungs.  Continuous positive airway pressure (CPAP) or bi-level positive airway pressure (BPAP) machine. This treatment uses mild air pressure to keep the airways open. A tube that is connected to a motor delivers oxygen to the body.  Ventilator. This treatment moves air into and out of the lungs by using a tube that is placed in your windpipe.  Tracheostomy. This is a procedure to create a hole in the neck so that a breathing tube can be inserted.  Extracorporeal membrane oxygenation (ECMO). This procedure gives the lungs a chance to recover by taking over the functions of the heart and lungs. It supplies oxygen to the body and removes carbon dioxide. Follow these instructions at home: Lifestyle  If you are sick, stay home except to get medical care. Your health care provider will tell you how long to stay home. Call your health care provider before you go for medical care.  Rest at home as told by your health care provider.  Do not use any products that contain nicotine or tobacco, such as cigarettes,  e-cigarettes, and chewing tobacco. If you need help quitting, ask your health care provider.  Return to your normal activities as told by your health care provider. Ask your health care provider what activities are safe for you. General instructions  Take over-the-counter and prescription medicines only as told by your health care provider.  Drink enough fluid to keep your urine pale yellow.  Keep all follow-up visits as told by your health care provider. This is important. How is this prevented?  There is no vaccine to help prevent COVID-19 infection. However, there are steps you can take to protect yourself and others from this virus. To protect yourself:   Do not travel to areas where COVID-19 is a risk. The areas where COVID-19 is reported change often. To identify high-risk areas and travel restrictions, check the CDC travel website: wwwnc.cdc.gov/travel/notices  If you live in, or must travel to, an area where COVID-19 is a risk, take precautions to avoid infection. ? Stay away from people who are sick. ? Wash your hands often with soap and water for 20 seconds. If soap and water   are not available, use an alcohol-based hand sanitizer. ? Avoid touching your mouth, face, eyes, or nose. ? Avoid going out in public, follow guidance from your state and local health authorities. ? If you must go out in public, wear a cloth face covering or face mask. Make sure your mask covers your nose and mouth. ? Avoid crowded indoor spaces. Stay at least 6 feet (2 meters) away from others. ? Disinfect objects and surfaces that are frequently touched every day. This may include:  Counters and tables.  Doorknobs and light switches.  Sinks and faucets.  Electronics, such as phones, remote controls, keyboards, computers, and tablets. To protect others: If you have symptoms of COVID-19, take steps to prevent the virus from spreading to others.  If you think you have a COVID-19 infection, contact  your health care provider right away. Tell your health care team that you think you may have a COVID-19 infection.  Stay home. Leave your house only to seek medical care. Do not use public transport.  Do not travel while you are sick.  Wash your hands often with soap and water for 20 seconds. If soap and water are not available, use alcohol-based hand sanitizer.  Stay away from other members of your household. Let healthy household members care for children and pets, if possible. If you have to care for children or pets, wash your hands often and wear a mask. If possible, stay in your own room, separate from others. Use a different bathroom.  Make sure that all people in your household wash their hands well and often.  Cough or sneeze into a tissue or your sleeve or elbow. Do not cough or sneeze into your hand or into the air.  Wear a cloth face covering or face mask. Make sure your mask covers your nose and mouth. Where to find more information  Centers for Disease Control and Prevention: www.cdc.gov/coronavirus/2019-ncov/index.html  World Health Organization: www.who.int/health-topics/coronavirus Contact a health care provider if:  You live in or have traveled to an area where COVID-19 is a risk and you have symptoms of the infection.  You have had contact with someone who has COVID-19 and you have symptoms of the infection. Get help right away if:  You have trouble breathing.  You have pain or pressure in your chest.  You have confusion.  You have bluish lips and fingernails.  You have difficulty waking from sleep.  You have symptoms that get worse. These symptoms may represent a serious problem that is an emergency. Do not wait to see if the symptoms will go away. Get medical help right away. Call your local emergency services (911 in the [place] Do not drive yourself to the hospital. Let the emergency medical personnel know if you think you have  COVID-19. Summary  COVID-19 is a respiratory infection that is caused by a virus. It is also known as coronavirus disease or novel coronavirus. It can cause serious infections, such as pneumonia, acute respiratory distress syndrome, acute respiratory failure, or sepsis.  The virus that causes COVID-19 is contagious. This means that it can spread from person to person through droplets from breathing, speaking, singing, coughing, or sneezing.  You are more likely to develop a serious illness if you are 50 years of age or older, have a weak immune system, live in a nursing home, or have chronic disease.  There is no medicine to treat COVID-19. Your health care provider will talk with you about ways to treat your symptoms.    Take steps to protect yourself and others from infection. Wash your hands often and disinfect objects and surfaces that are frequently touched every day. Stay away from people who are sick and wear a mask if you are sick. This information is not intended to replace advice given to you by your health care provider. Make sure you discuss any questions you have with your health care provider. Document Revised: 08/19/2019 Document Reviewed: 11/25/2018 Elsevier Patient Education  2020 [employer] Inc. What types of side effects do monoclonal antibody drugs cause?  Common side effects  In general, the more common side effects caused by monoclonal antibody drugs include: . Allergic reactions, such as hives or itching . Flu-like signs and symptoms, including chills, fatigue, fever, and muscle aches and pains . Nausea, vomiting . Diarrhea . Skin rashes . Low blood pressure   The CDC is recommending patients who receive monoclonal antibody treatments wait at least 90 days before being vaccinated.  Currently, there are no data on the safety and efficacy of mRNA COVID-19 vaccines in persons who received monoclonal antibodies or convalescent plasma as part of COVID-19 treatment. Based  on the estimated half-life of such therapies as well as evidence suggesting that reinfection is uncommon in the 90 days after initial infection, vaccination should be deferred for at least 90 days, as a precautionary measure until additional information becomes available, to avoid interference of the antibody treatment with vaccine-induced immune responses. 

## 2019-12-02 ENCOUNTER — Encounter (HOSPITAL_COMMUNITY): Payer: Self-pay

## 2019-12-02 ENCOUNTER — Ambulatory Visit (HOSPITAL_COMMUNITY)
Admit: 2019-12-02 | Discharge: 2019-12-02 | Disposition: A | Discharge status: Home / Self Care | Payer: Self-pay | Attending: Pulmonary Disease | Admitting: Pulmonary Disease

## 2019-12-02 MED ORDER — EPINEPHRINE 0.3 MG/0.3ML IJ SOAJ: 0.3000 mg | Freq: Once | INTRAMUSCULAR | Status: DC | PRN

## 2019-12-02 MED ORDER — SODIUM CHLORIDE 0.9 % IV SOLN: 100.0000 mg | Freq: Once | INTRAVENOUS | Status: AC

## 2019-12-02 MED ORDER — DIPHENHYDRAMINE HCL 50 MG/ML IJ SOLN: 50.0000 mg | Freq: Once | INTRAMUSCULAR | Status: DC | PRN

## 2019-12-02 MED ORDER — SODIUM CHLORIDE 0.9 % IV SOLN: INTRAVENOUS | Status: DC | PRN

## 2019-12-02 MED ORDER — SODIUM CHLORIDE 0.9 % IV SOLN
INTRAVENOUS | Status: AC
  Administered 2019-12-02: 100 mg via INTRAVENOUS
  Filled 2019-12-02: qty 20

## 2019-12-02 MED ORDER — ALBUTEROL SULFATE HFA 108 (90 BASE) MCG/ACT IN AERS: 2.0000 | INHALATION_SPRAY | Freq: Once | RESPIRATORY_TRACT | Status: DC | PRN

## 2019-12-02 MED ORDER — FAMOTIDINE IN NACL 20-0.9 MG/50ML-% IV SOLN: 20.0000 mg | Freq: Once | INTRAVENOUS | Status: DC | PRN

## 2019-12-02 MED ORDER — METHYLPREDNISOLONE SODIUM SUCC 125 MG IJ SOLR: 125.0000 mg | Freq: Once | INTRAMUSCULAR | Status: DC | PRN

## 2019-12-02 NOTE — Progress Notes (Signed)
  Diagnosis: COVID-19  Physician: Dr. Soyla Dryer  Procedure: [facility] Med: remdesivir infusion.  Complications: No immediate complications noted.  Discharge: Discharged home   Behr Cislo L 12/02/2019

## 2019-12-02 NOTE — Discharge Instructions (Signed)

## 2019-12-03 ENCOUNTER — Ambulatory Visit (HOSPITAL_COMMUNITY)
Admit: 2019-12-03 | Discharge: 2019-12-03 | Disposition: A | Discharge status: Home / Self Care | Payer: Self-pay | Attending: Pulmonary Disease | Admitting: Pulmonary Disease

## 2019-12-03 MED ORDER — FAMOTIDINE IN NACL 20-0.9 MG/50ML-% IV SOLN: 20.0000 mg | Freq: Once | INTRAVENOUS | Status: DC | PRN

## 2019-12-03 MED ORDER — DIPHENHYDRAMINE HCL 50 MG/ML IJ SOLN: 50.0000 mg | Freq: Once | INTRAMUSCULAR | Status: DC | PRN

## 2019-12-03 MED ORDER — SODIUM CHLORIDE 0.9 % IV SOLN
INTRAVENOUS | Status: AC
  Filled 2019-12-03: qty 20

## 2019-12-03 MED ORDER — EPINEPHRINE 0.3 MG/0.3ML IJ SOAJ: 0.3000 mg | Freq: Once | INTRAMUSCULAR | Status: DC | PRN

## 2019-12-03 MED ORDER — SODIUM CHLORIDE 0.9 % IV SOLN
100.0000 mg | Freq: Once | INTRAVENOUS | Status: AC
  Administered 2019-12-03: 100 mg via INTRAVENOUS

## 2019-12-03 MED ORDER — SODIUM CHLORIDE 0.9 % IV SOLN: INTRAVENOUS | Status: DC | PRN

## 2019-12-03 MED ORDER — METHYLPREDNISOLONE SODIUM SUCC 125 MG IJ SOLR: 125.0000 mg | Freq: Once | INTRAMUSCULAR | Status: DC | PRN

## 2019-12-03 MED ORDER — ALBUTEROL SULFATE HFA 108 (90 BASE) MCG/ACT IN AERS: 2.0000 | INHALATION_SPRAY | Freq: Once | RESPIRATORY_TRACT | Status: DC | PRN

## 2019-12-03 NOTE — Progress Notes (Signed)
  Diagnosis: COVID-19  Physician: Dr. Joya Gaskins Procedure: [facility] Med: remdesivir infusion.  Complications: No immediate complications noted.  Discharge: Discharged home   Jack Cherry 12/03/2019

## 2019-12-03 NOTE — Discharge Instructions (Signed)
COVID-19 COVID-19 is a respiratory infection that is caused by a virus called severe acute respiratory syndrome coronavirus 2 (SARS-CoV-2). The disease is also known as coronavirus disease or novel coronavirus. In some people, the virus may not cause any symptoms. In others, it may cause a serious infection. The infection can get worse quickly and can lead to complications, such as:  Pneumonia, or infection of the lungs.  Acute respiratory distress syndrome or ARDS. This is a condition in which fluid build-up in the lungs prevents the lungs from filling with air and passing oxygen into the blood.  Acute respiratory failure. This is a condition in which there is not enough oxygen passing from the lungs to the body or when carbon dioxide is not passing from the lungs out of the body.  Sepsis or septic shock. This is a serious bodily reaction to an infection.  Blood clotting problems.  Secondary infections due to bacteria or fungus.  Organ failure. This is when your body's organs stop working. The virus that causes COVID-19 is contagious. This means that it can spread from person to person through droplets from coughs and sneezes (respiratory secretions). What are the causes? This illness is caused by a virus. You may catch the virus by:  Breathing in droplets from an infected person. Droplets can be spread by a person breathing, speaking, singing, coughing, or sneezing.  Touching something, like a table or a doorknob, that was exposed to the virus (contaminated) and then touching your mouth, nose, or eyes. What increases the risk? Risk for infection You are more likely to be infected with this virus if you:  Are within 6 feet (2 meters) of a person with COVID-19.  Provide care for or live with a person who is infected with COVID-19.  Spend time in crowded indoor spaces or live in shared housing. Risk for serious illness You are more likely to become seriously ill from the virus if  you:  Are 50 years of age or older. The higher your age, the more you are at risk for serious illness.  Live in a nursing home or long-term care facility.  Have cancer.  Have a long-term (chronic) disease such as: ? Chronic lung disease, including chronic obstructive pulmonary disease or asthma. ? A long-term disease that lowers your body's ability to fight infection (immunocompromised). ? Heart disease, including heart failure, a condition in which the arteries that lead to the heart become narrow or blocked (coronary artery disease), a disease which makes the heart muscle thick, weak, or stiff (cardiomyopathy). ? Diabetes. ? Chronic kidney disease. ? Sickle cell disease, a condition in which red blood cells have an abnormal "sickle" shape. ? Liver disease.  Are obese. What are the signs or symptoms? Symptoms of this condition can range from mild to severe. Symptoms may appear any time from 2 to 14 days after being exposed to the virus. They include:  A fever or chills.  A cough.  Difficulty breathing.  Headaches, body aches, or muscle aches.  Runny or stuffy (congested) nose.  A sore throat.  New loss of taste or smell. Some people may also have stomach problems, such as nausea, vomiting, or diarrhea. Other people may not have any symptoms of COVID-19. How is this diagnosed? This condition may be diagnosed based on:  Your signs and symptoms, especially if: ? You live in an area with a COVID-19 outbreak. ? You recently traveled to or from an area where the virus is common. ? You   provide care for or live with a person who was diagnosed with COVID-19. ? You were exposed to a person who was diagnosed with COVID-19.  A physical exam.  Lab tests, which may include: ? Taking a sample of fluid from the back of your nose and throat (nasopharyngeal fluid), your nose, or your throat using a swab. ? A sample of mucus from your lungs (sputum). ? Blood tests.  Imaging tests,  which may include, X-rays, CT scan, or ultrasound. How is this treated? At present, there is no medicine to treat COVID-19. Medicines that treat other diseases are being used on a trial basis to see if they are effective against COVID-19. Your health care provider will talk with you about ways to treat your symptoms. For most people, the infection is mild and can be managed at home with rest, fluids, and over-the-counter medicines. Treatment for a serious infection usually takes places in a hospital intensive care unit (ICU). It may include one or more of the following treatments. These treatments are given until your symptoms improve.  Receiving fluids and medicines through an IV.  Supplemental oxygen. Extra oxygen is given through a tube in the nose, a face mask, or a hood.  Positioning you to lie on your stomach (prone position). This makes it easier for oxygen to get into the lungs.  Continuous positive airway pressure (CPAP) or bi-level positive airway pressure (BPAP) machine. This treatment uses mild air pressure to keep the airways open. A tube that is connected to a motor delivers oxygen to the body.  Ventilator. This treatment moves air into and out of the lungs by using a tube that is placed in your windpipe.  Tracheostomy. This is a procedure to create a hole in the neck so that a breathing tube can be inserted.  Extracorporeal membrane oxygenation (ECMO). This procedure gives the lungs a chance to recover by taking over the functions of the heart and lungs. It supplies oxygen to the body and removes carbon dioxide. Follow these instructions at home: Lifestyle  If you are sick, stay home except to get medical care. Your health care provider will tell you how long to stay home. Call your health care provider before you go for medical care.  Rest at home as told by your health care provider.  Do not use any products that contain nicotine or tobacco, such as cigarettes,  e-cigarettes, and chewing tobacco. If you need help quitting, ask your health care provider.  Return to your normal activities as told by your health care provider. Ask your health care provider what activities are safe for you. General instructions  Take over-the-counter and prescription medicines only as told by your health care provider.  Drink enough fluid to keep your urine pale yellow.  Keep all follow-up visits as told by your health care provider. This is important. How is this prevented?  There is no vaccine to help prevent COVID-19 infection. However, there are steps you can take to protect yourself and others from this virus. To protect yourself:   Do not travel to areas where COVID-19 is a risk. The areas where COVID-19 is reported change often. To identify high-risk areas and travel restrictions, check the CDC travel website: wwwnc.cdc.gov/travel/notices  If you live in, or must travel to, an area where COVID-19 is a risk, take precautions to avoid infection. ? Stay away from people who are sick. ? Wash your hands often with soap and water for 20 seconds. If soap and water   are not available, use an alcohol-based hand sanitizer. ? Avoid touching your mouth, face, eyes, or nose. ? Avoid going out in public, follow guidance from your state and local health authorities. ? If you must go out in public, wear a cloth face covering or face mask. Make sure your mask covers your nose and mouth. ? Avoid crowded indoor spaces. Stay at least 6 feet (2 meters) away from others. ? Disinfect objects and surfaces that are frequently touched every day. This may include:  Counters and tables.  Doorknobs and light switches.  Sinks and faucets.  Electronics, such as phones, remote controls, keyboards, computers, and tablets. To protect others: If you have symptoms of COVID-19, take steps to prevent the virus from spreading to others.  If you think you have a COVID-19 infection, contact  your health care provider right away. Tell your health care team that you think you may have a COVID-19 infection.  Stay home. Leave your house only to seek medical care. Do not use public transport.  Do not travel while you are sick.  Wash your hands often with soap and water for 20 seconds. If soap and water are not available, use alcohol-based hand sanitizer.  Stay away from other members of your household. Let healthy household members care for children and pets, if possible. If you have to care for children or pets, wash your hands often and wear a mask. If possible, stay in your own room, separate from others. Use a different bathroom.  Make sure that all people in your household wash their hands well and often.  Cough or sneeze into a tissue or your sleeve or elbow. Do not cough or sneeze into your hand or into the air.  Wear a cloth face covering or face mask. Make sure your mask covers your nose and mouth. Where to find more information  Centers for Disease Control and Prevention: www.cdc.gov/coronavirus/2019-ncov/index.html  World Health Organization: www.who.int/health-topics/coronavirus Contact a health care provider if:  You live in or have traveled to an area where COVID-19 is a risk and you have symptoms of the infection.  You have had contact with someone who has COVID-19 and you have symptoms of the infection. Get help right away if:  You have trouble breathing.  You have pain or pressure in your chest.  You have confusion.  You have bluish lips and fingernails.  You have difficulty waking from sleep.  You have symptoms that get worse. These symptoms may represent a serious problem that is an emergency. Do not wait to see if the symptoms will go away. Get medical help right away. Call your local emergency services (911 in the [place] Do not drive yourself to the hospital. Let the emergency medical personnel know if you think you have  COVID-19. Summary  COVID-19 is a respiratory infection that is caused by a virus. It is also known as coronavirus disease or novel coronavirus. It can cause serious infections, such as pneumonia, acute respiratory distress syndrome, acute respiratory failure, or sepsis.  The virus that causes COVID-19 is contagious. This means that it can spread from person to person through droplets from breathing, speaking, singing, coughing, or sneezing.  You are more likely to develop a serious illness if you are 50 years of age or older, have a weak immune system, live in a nursing home, or have chronic disease.  There is no medicine to treat COVID-19. Your health care provider will talk with you about ways to treat your symptoms.    Take steps to protect yourself and others from infection. Wash your hands often and disinfect objects and surfaces that are frequently touched every day. Stay away from people who are sick and wear a mask if you are sick. This information is not intended to replace advice given to you by your health care provider. Make sure you discuss any questions you have with your health care provider. Document Revised: 08/19/2019 Document Reviewed: 11/25/2018 Elsevier Patient Education  2020 [employer] Inc. What types of side effects do monoclonal antibody drugs cause?  Common side effects  In general, the more common side effects caused by monoclonal antibody drugs include: . Allergic reactions, such as hives or itching . Flu-like signs and symptoms, including chills, fatigue, fever, and muscle aches and pains . Nausea, vomiting . Diarrhea . Skin rashes . Low blood pressure   The CDC is recommending patients who receive monoclonal antibody treatments wait at least 90 days before being vaccinated.  Currently, there are no data on the safety and efficacy of mRNA COVID-19 vaccines in persons who received monoclonal antibodies or convalescent plasma as part of COVID-19 treatment. Based  on the estimated half-life of such therapies as well as evidence suggesting that reinfection is uncommon in the 90 days after initial infection, vaccination should be deferred for at least 90 days, as a precautionary measure until additional information becomes available, to avoid interference of the antibody treatment with vaccine-induced immune responses. 

## 2020-01-17 ENCOUNTER — Ambulatory Visit: Payer: Self-pay | Admitting: Physician Assistant

## 2020-02-14 ENCOUNTER — Other Ambulatory Visit (HOSPITAL_COMMUNITY)
Admission: RE | Admit: 2020-02-14 | Discharge: 2020-02-14 | Disposition: A | Discharge status: Home / Self Care | Payer: Self-pay | Source: Ambulatory Visit | Attending: Physician Assistant | Admitting: Physician Assistant

## 2020-02-14 DIAGNOSIS — D649 Anemia, unspecified: Secondary | ICD-10-CM | POA: Insufficient documentation

## 2020-02-14 DIAGNOSIS — E785 Hyperlipidemia, unspecified: Secondary | ICD-10-CM | POA: Insufficient documentation

## 2020-02-14 DIAGNOSIS — I10 Essential (primary) hypertension: Secondary | ICD-10-CM | POA: Insufficient documentation

## 2020-02-14 DIAGNOSIS — E119 Type 2 diabetes mellitus without complications: Secondary | ICD-10-CM | POA: Insufficient documentation

## 2020-02-14 LAB — HEMOGLOBIN AND HEMATOCRIT, BLOOD
HCT: 37 % — ABNORMAL LOW (ref 39.0–52.0)
Hemoglobin: 11.9 g/dL — ABNORMAL LOW (ref 13.0–17.0)

## 2020-02-14 LAB — LIPID PANEL
Cholesterol: 148 mg/dL (ref 0–200)
HDL: 33 mg/dL — ABNORMAL LOW (ref >40)
LDL Cholesterol: 88 mg/dL (ref 0–99)
Total CHOL/HDL Ratio: 4.5 RATIO
Triglycerides: 133 mg/dL (ref <150)
VLDL: 27 mg/dL (ref 0–40)

## 2020-02-14 LAB — COMPREHENSIVE METABOLIC PANEL
ALT: 29 U/L (ref 0–44)
AST: 27 U/L (ref 15–41)
Albumin: 4.2 g/dL (ref 3.5–5.0)
Alkaline Phosphatase: 60 U/L (ref 38–126)
Anion gap: 8 (ref 5–15)
BUN: 15 mg/dL (ref 8–23)
CO2: 26 mmol/L (ref 22–32)
Calcium: 9.1 mg/dL (ref 8.9–10.3)
Chloride: 107 mmol/L (ref 98–111)
Creatinine, Ser: 0.95 mg/dL (ref 0.61–1.24)
GFR calc Af Amer: >60 mL/min (ref >60)
GFR calc non Af Amer: >60 mL/min (ref >60)
Glucose, Bld: 106 mg/dL — ABNORMAL HIGH (ref 70–99)
Potassium: 3.7 mmol/L (ref 3.5–5.1)
Sodium: 141 mmol/L (ref 135–145)
Total Bilirubin: 0.6 mg/dL (ref 0.3–1.2)
Total Protein: 7.1 g/dL (ref 6.5–8.1)

## 2020-02-15 LAB — HEMOGLOBIN A1C
Hgb A1c MFr Bld: 6.2 % — ABNORMAL HIGH (ref 4.8–5.6)
Mean Plasma Glucose: 131 mg/dL

## 2020-02-22 ENCOUNTER — Ambulatory Visit: Payer: Self-pay | Admitting: Physician Assistant

## 2020-02-28 ENCOUNTER — Encounter: Payer: Self-pay | Admitting: Physician Assistant

## 2020-02-28 ENCOUNTER — Other Ambulatory Visit: Payer: Self-pay

## 2020-02-28 ENCOUNTER — Ambulatory Visit: Payer: Self-pay | Admitting: Physician Assistant

## 2020-02-28 VITALS — BP 138/96 | HR 64 | Temp 98.1°F | Wt 260.0 lb

## 2020-02-28 DIAGNOSIS — E785 Hyperlipidemia, unspecified: Secondary | ICD-10-CM

## 2020-02-28 DIAGNOSIS — E119 Type 2 diabetes mellitus without complications: Secondary | ICD-10-CM

## 2020-02-28 DIAGNOSIS — Z9119 Patient's noncompliance with other medical treatment and regimen: Secondary | ICD-10-CM

## 2020-02-28 DIAGNOSIS — Z91199 Patient's noncompliance with other medical treatment and regimen due to unspecified reason: Secondary | ICD-10-CM

## 2020-02-28 DIAGNOSIS — E669 Obesity, unspecified: Secondary | ICD-10-CM

## 2020-02-28 DIAGNOSIS — I491 Atrial premature depolarization: Secondary | ICD-10-CM

## 2020-02-28 DIAGNOSIS — Z550 Illiteracy and low-level literacy: Secondary | ICD-10-CM

## 2020-02-28 DIAGNOSIS — D649 Anemia, unspecified: Secondary | ICD-10-CM

## 2020-02-28 DIAGNOSIS — I10 Essential (primary) hypertension: Secondary | ICD-10-CM

## 2020-02-28 MED ORDER — AMLODIPINE BESYLATE 10 MG PO TABS: 10.0000 mg | ORAL_TABLET | Freq: Every day | ORAL | 1 refills | Status: DC

## 2020-02-28 MED ORDER — ATORVASTATIN CALCIUM 20 MG PO TABS: 20.0000 mg | ORAL_TABLET | Freq: Every day | ORAL | 1 refills | Status: DC

## 2020-02-28 MED ORDER — HYDROCHLOROTHIAZIDE 25 MG PO TABS: 25.0000 mg | ORAL_TABLET | Freq: Every day | ORAL | 1 refills | Status: DC

## 2020-02-28 MED ORDER — METFORMIN HCL ER 500 MG PO TB24: 500.0000 mg | ORAL_TABLET | Freq: Every day | ORAL | 1 refills | Status: DC

## 2020-02-28 MED ORDER — LORATADINE 10 MG PO TABS: 10.0000 mg | ORAL_TABLET | Freq: Every day | ORAL | 1 refills | Status: DC | PRN

## 2020-02-28 MED ORDER — LOSARTAN POTASSIUM 100 MG PO TABS: 100.0000 mg | ORAL_TABLET | Freq: Every day | ORAL | 1 refills | Status: DC

## 2020-02-28 NOTE — Progress Notes (Signed)
BP (!) 138/96   Pulse 64   Temp 98.1 F (36.7 C)   Wt 260 lb (117.9 kg)   SpO2 97%   BMI 34.30 kg/m    Subjective:    Patient ID: Jack Cherry, male    DOB: [birth year], 64 y.o.   MRN: [id number]  HPI: Korey Lenoir is a 64 y.o. male presenting on 02/28/2020 for Diabetes, Hypertension, and Hyperlipidemia   HPI   Pt had a negative covid 19 screening questionnaire.  14yoM with DM, htn, dyslipidemia.  He was scheduled for appointment in March but was a no-show.  He was scheduled for appointment last week but did not come in until the day after his scheduled appointment  Pt was in hospital in January with COVID 19  He says he is feeling fine.  No complaints  Pt got 1st covid vaccination.  He will be getting 2nd dose May 11.    Pt is not taking all his meds- his medication management is complicated by the fact that pt is a non-reader.        Relevant past medical, surgical, family and social history reviewed and updated as indicated. Interim medical history since our last visit reviewed. Allergies and medications reviewed and updated.    Current Outpatient Medications:  .  albuterol (VENTOLIN HFA) 108 (90 Base) MCG/ACT inhaler, Inhale 1 puff into the lungs every 4 (four) hours as needed for wheezing or shortness of breath., Disp: 18 g, Rfl: 0 .  amLODipine (NORVASC) 10 MG tablet, Take 1 tablet (10 mg total) by mouth daily. for blood pressure, Disp: 90 tablet, Rfl: 3 .  ascorbic acid (VITAMIN C) 500 MG tablet, Take 1 tablet (500 mg total) by mouth daily., Disp: 30 tablet, Rfl: 3 .  atorvastatin (LIPITOR) 20 MG tablet, Take 1 tablet (20 mg total) by mouth daily., Disp: 90 tablet, Rfl: 3 .  losartan (COZAAR) 100 MG tablet, Take 1 tablet (100 mg total) by mouth daily., Disp: 90 tablet, Rfl: 3 .  metoprolol succinate (TOPROL-XL) 100 MG 24 hr tablet, Take 1 tablet (100 mg total) by mouth daily. Take with or immediately following a meal., Disp: 90 tablet, Rfl: 3 .  Omega-3 Fatty  Acids (FISH OIL) 1000 MG CAPS, Take 1 capsule (1,000 mg total) by mouth daily., Disp: , Rfl: 0 .  zinc sulfate 220 (50 Zn) MG capsule, Take 1 capsule (220 mg total) by mouth daily., Disp: 30 capsule, Rfl: 1 .  Ferrous Sulfate (IRON) 325 (65 Fe) MG TABS, Take 1 tablet by mouth daily., Disp: , Rfl:  .  hydrochlorothiazide (HYDRODIURIL) 25 MG tablet, Take 25 mg by mouth daily., Disp: , Rfl:  .  loratadine (CLARITIN) 10 MG tablet, Take 1 tablet (10 mg total) by mouth daily as needed. (Patient not taking: Reported on 02/28/2020), Disp: 90 tablet, Rfl: 3 .  metFORMIN (GLUCOPHAGE XR) 500 MG 24 hr tablet, Take 1 tablet (500 mg total) by mouth See admin instructions. Take 1 tablet twice a day for 1 week through 12/07/2019 then go back to 1 tablet every morning with food starting on 12/08/2019 (Patient not taking: Reported on 02/28/2020), Disp: 90 tablet, Rfl: 3 .  Multiple Vitamin (MULTIVITAMIN WITH MINERALS) TABS tablet, Take 1 tablet by mouth daily. (Patient not taking: Reported on 02/28/2020), Disp: 30 tablet, Rfl: 2 .  omeprazole (PRILOSEC) 20 MG capsule, TAKE 1 Capsule BY MOUTH EVERY MORNING 30 MINUTES BEFORE BREAKFAST (Patient not taking: Reported on 02/28/2020), Disp: 90 capsule, Rfl: 3  Review of Systems  Per HPI unless specifically indicated above     Objective:    BP (!) 138/96   Pulse 64   Temp 98.1 F (36.7 C)   Wt 260 lb (117.9 kg)   SpO2 97%   BMI 34.30 kg/m   Wt Readings from Last 3 Encounters:  02/28/20 260 lb (117.9 kg)  11/28/19 260 lb (117.9 kg)  07/19/19 260 lb 9.6 oz (118.2 kg)    Physical Exam Vitals reviewed.  Constitutional:      General: He is not in acute distress.    Appearance: He is well-developed. He is obese. He is not ill-appearing.  HENT:     Head: Normocephalic and atraumatic.  Cardiovascular:     Rate and Rhythm: Normal rate. Rhythm irregular.  Pulmonary:     Effort: Pulmonary effort is normal.     Breath sounds: Normal breath sounds. No wheezing.   Abdominal:     General: Bowel sounds are normal.     Palpations: Abdomen is soft.     Tenderness: There is no abdominal tenderness.  Musculoskeletal:     Cervical back: Neck supple.     Right lower leg: No edema.     Left lower leg: No edema.  Lymphadenopathy:     Cervical: No cervical adenopathy.  Skin:    General: Skin is warm and dry.  Neurological:     Mental Status: He is alert and oriented to person, place, and time.  Psychiatric:        Attention and Perception: Attention normal.        Behavior: Behavior is cooperative.     Results for orders placed or performed during the hospital encounter of 02/14/20  Hemoglobin and hematocrit, blood  Result Value Ref Range   Hemoglobin 11.9 (L) 13.0 - 17.0 g/dL   HCT 37.0 (L) 39.0 - 52.0 %  Hemoglobin A1c  Result Value Ref Range   Hgb A1c MFr Bld 6.2 (H) 4.8 - 5.6 %   Mean Plasma Glucose 131 mg/dL  Lipid panel  Result Value Ref Range   Cholesterol 148 0 - 200 mg/dL   Triglycerides 133 <150 mg/dL   HDL 33 (L) >40 mg/dL   Total CHOL/HDL Ratio 4.5 RATIO   VLDL 27 0 - 40 mg/dL   LDL Cholesterol 88 0 - 99 mg/dL  Comprehensive metabolic panel  Result Value Ref Range   Sodium 141 135 - 145 mmol/L   Potassium 3.7 3.5 - 5.1 mmol/L   Chloride 107 98 - 111 mmol/L   CO2 26 22 - 32 mmol/L   Glucose, Bld 106 (H) 70 - 99 mg/dL   BUN 15 8 - 23 mg/dL   Creatinine, Ser 0.95 0.61 - 1.24 mg/dL   Calcium 9.1 8.9 - 10.3 mg/dL   Total Protein 7.1 6.5 - 8.1 g/dL   Albumin 4.2 3.5 - 5.0 g/dL   AST 27 15 - 41 U/L   ALT 29 0 - 44 U/L   Alkaline Phosphatase 60 38 - 126 U/L   Total Bilirubin 0.6 0.3 - 1.2 mg/dL   GFR calc non Af Amer >60 >60 mL/min   GFR calc Af Amer >60 >60 mL/min   Anion gap 8 5 - 15    EKG- sinus rhythm at 68bpm with frequent premature beats.  No ST-T changes      Assessment & Plan:   Encounter Diagnoses  Name Primary?  . Essential hypertension, benign Yes  . Type 2 diabetes mellitus without  complication,  unspecified whether long term insulin use ([facility])   . Hyperlipidemia, unspecified hyperlipidemia type   . Anemia, unspecified type   . 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 Obesity, unspecified classification, unspecified obesity type, unspecified whether serious comorbidity present   . Personal history of noncompliance with medical treatment, presenting hazards to health   . PAC (premature atrial contraction)       -reviewed labs with pt -pt to Resume hctz for better bp control -discussed with pt that that he is getting medicare later this year and then he will no longer be eligible for services at [facility].  He states understanding.   -no other changes to medications (other than restarting the hctz) -pt to follow up 3 months.  He is to contact office sooner prn

## 2020-05-29 ENCOUNTER — Ambulatory Visit: Payer: Self-pay | Admitting: Physician Assistant

## 2020-06-07 ENCOUNTER — Other Ambulatory Visit: Payer: Self-pay | Admitting: Physician Assistant

## 2020-11-03 IMAGING — DX DG CHEST 1V PORT
1 series · 1 of 1 positions shown · non-contrast
Comparison: 12/26/2015

CLINICAL DATA: Shortness of breath

EXAM:
PORTABLE CHEST 1 VIEW

[chest ap]
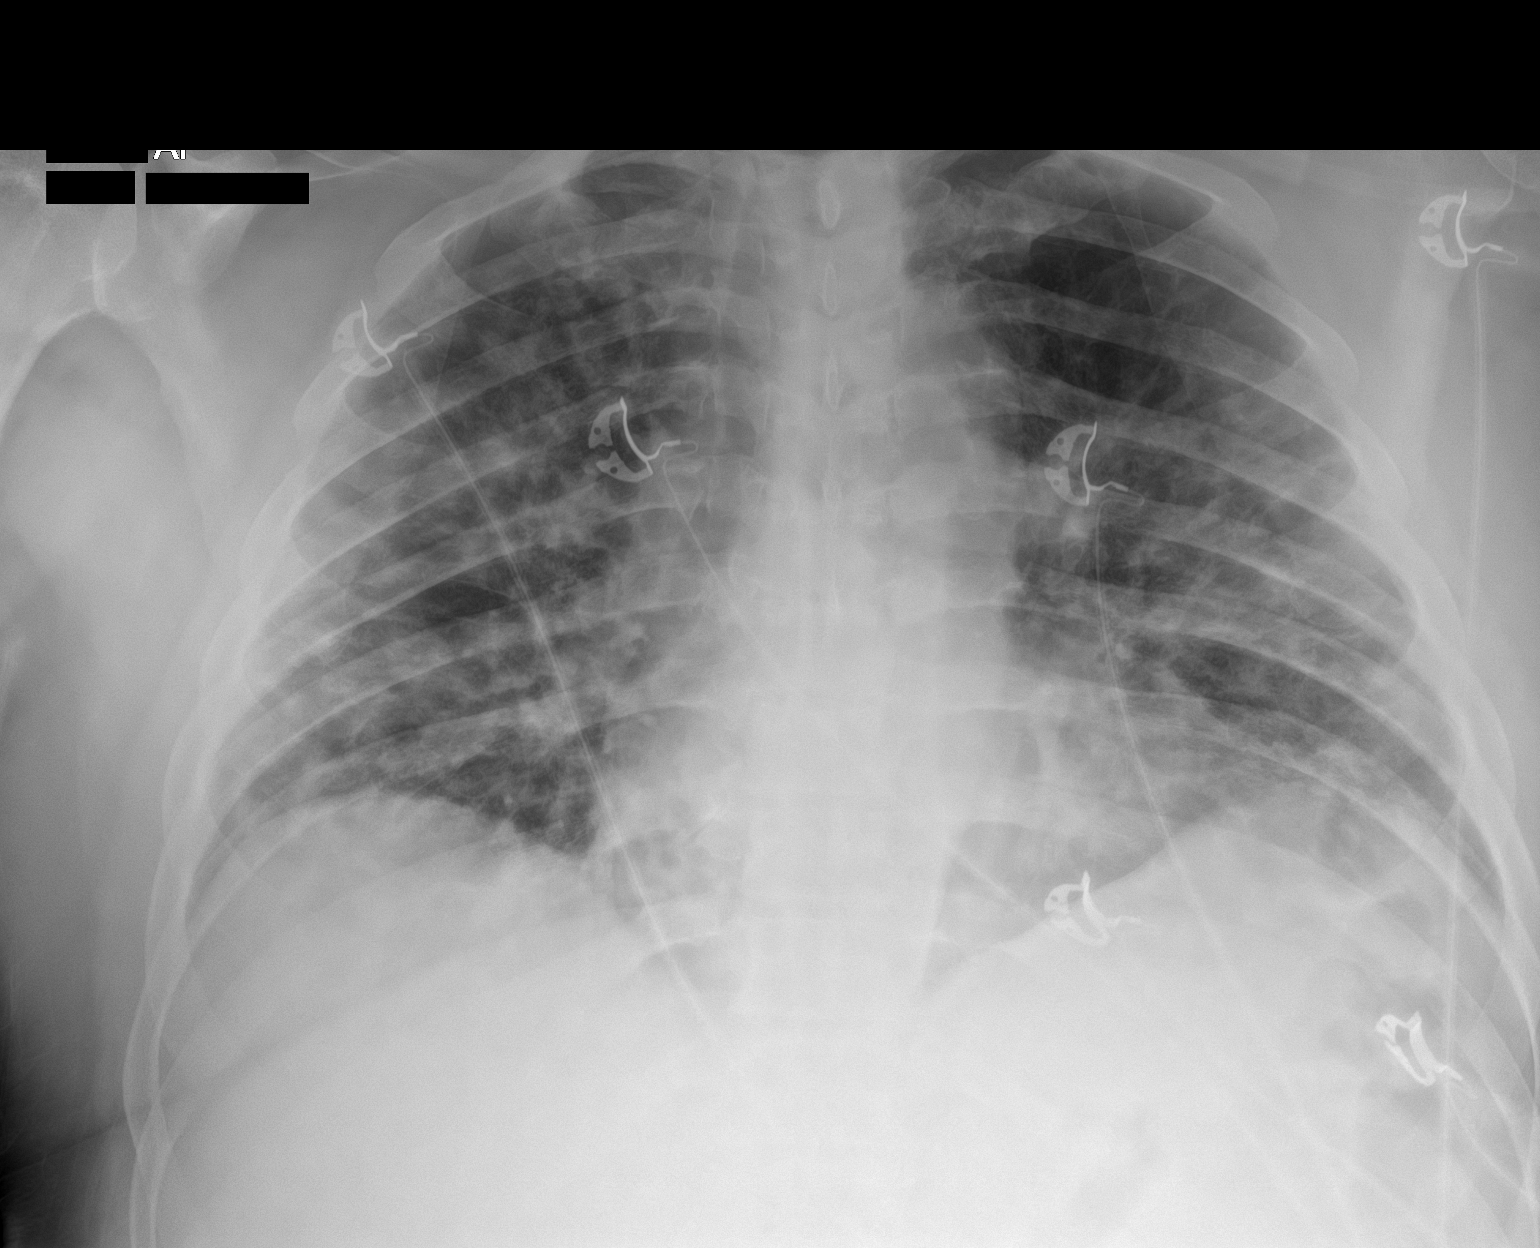

[1 of 1 positions shown; findings below may reference images not displayed]

FINDINGS: Diffuse bilateral airspace disease concerning for infection. COVID
pneumonia could have this appearance. Heart is normal size. No
effusions or acute bony abnormality.
IMPRESSION: Diffuse bilateral airspace disease concerning for pneumonia. COVID
pneumonia could have this appearance.

## 2020-11-21 ENCOUNTER — Ambulatory Visit: Payer: Self-pay | Admitting: Family Medicine

## 2020-11-23 ENCOUNTER — Ambulatory Visit (INDEPENDENT_AMBULATORY_CARE_PROVIDER_SITE_OTHER): Payer: Medicare Other | Admitting: Family Medicine

## 2020-11-23 ENCOUNTER — Other Ambulatory Visit: Payer: Self-pay

## 2020-11-23 ENCOUNTER — Encounter: Payer: Self-pay | Admitting: Family Medicine

## 2020-11-23 ENCOUNTER — Telehealth: Payer: Self-pay | Admitting: *Deleted

## 2020-11-23 VITALS — BP 174/90 | HR 81 | Temp 98.2°F | Ht 73.0 in | Wt 269.4 lb

## 2020-11-23 DIAGNOSIS — E785 Hyperlipidemia, unspecified: Secondary | ICD-10-CM | POA: Diagnosis not present

## 2020-11-23 DIAGNOSIS — Z23 Encounter for immunization: Secondary | ICD-10-CM

## 2020-11-23 DIAGNOSIS — E119 Type 2 diabetes mellitus without complications: Secondary | ICD-10-CM

## 2020-11-23 DIAGNOSIS — Z7689 Persons encountering health services in other specified circumstances: Secondary | ICD-10-CM

## 2020-11-23 DIAGNOSIS — J302 Other seasonal allergic rhinitis: Secondary | ICD-10-CM

## 2020-11-23 DIAGNOSIS — R011 Cardiac murmur, unspecified: Secondary | ICD-10-CM

## 2020-11-23 DIAGNOSIS — Z6835 Body mass index (BMI) 35.0-35.9, adult: Secondary | ICD-10-CM

## 2020-11-23 DIAGNOSIS — I10 Essential (primary) hypertension: Secondary | ICD-10-CM | POA: Diagnosis not present

## 2020-11-23 DIAGNOSIS — R350 Frequency of micturition: Secondary | ICD-10-CM

## 2020-11-23 LAB — URINALYSIS, ROUTINE W REFLEX MICROSCOPIC
Bilirubin, UA: NEGATIVE
Glucose, UA: NEGATIVE
Ketones, UA: NEGATIVE
Leukocytes,UA: NEGATIVE
Nitrite, UA: NEGATIVE
Protein,UA: NEGATIVE
Specific Gravity, UA: >1.03 — ABNORMAL HIGH (ref 1.005–1.030)
Urobilinogen, Ur: 0.2 mg/dL (ref 0.2–1.0)
pH, UA: 5 (ref 5.0–7.5)

## 2020-11-23 LAB — MICROSCOPIC EXAMINATION

## 2020-11-23 LAB — BAYER DCA HB A1C WAIVED: HB A1C (BAYER DCA - WAIVED): 7 % — ABNORMAL HIGH (ref <7.0)

## 2020-11-23 MED ORDER — AMLODIPINE BESYLATE 10 MG PO TABS: 10.0000 mg | ORAL_TABLET | Freq: Every day | ORAL | 1 refills | Status: DC

## 2020-11-23 MED ORDER — FLUTICASONE PROPIONATE 50 MCG/ACT NA SUSP: 2.0000 | Freq: Every day | NASAL | 6 refills | Status: DC

## 2020-11-23 MED ORDER — METFORMIN HCL ER 500 MG PO TB24: 500.0000 mg | ORAL_TABLET | Freq: Every day | ORAL | 1 refills | Status: DC

## 2020-11-23 MED ORDER — ATORVASTATIN CALCIUM 20 MG PO TABS: 20.0000 mg | ORAL_TABLET | Freq: Every day | ORAL | 1 refills | Status: DC

## 2020-11-23 MED ORDER — HYDROCHLOROTHIAZIDE 25 MG PO TABS: 25.0000 mg | ORAL_TABLET | Freq: Every day | ORAL | 1 refills | Status: DC

## 2020-11-23 NOTE — Progress Notes (Signed)
New Patient Office Visit  Subjective:  Patient ID: Jack Cherry, male    DOB: [birth year]  Age: 65 y.o. MRN: [id number]  CC:  Chief Complaint  Patient presents with  . New Patient (Initial Visit)    HPI Jack Cherry presents to establish care. He is here with a friend [name]) today who is here as his advocate.   [facility] and [facility]. His last visit there was 6 months ago. He has not had his medication for at least 4 months. He reports that he is doing well.   Past Medical History:  Diagnosis Date  . 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 Diabetes ([place])   . High cholesterol   . Hypertension     Past Surgical History:  Procedure Laterality Date  . COLONOSCOPY N/A 01/08/2018   Dr. Oneida Alar three 3-8 mm pol;yps, simple adenomas. internal hemorrhoids. next colonoscopy in 3 years  . ESOPHAGOGASTRODUODENOSCOPY N/A 01/08/2018   Dr. Oneida Alar: normal esophagus, mild gastritis/duodenitis, H.pylori  . GIVENS CAPSULE STUDY N/A 12/21/2018   Procedure: GIVENS CAPSULE STUDY;  Surgeon: Danie Binder, MD;  Location: [facility];  Service: Endoscopy;  Laterality: N/A;  7:30am  . HERNIA REPAIR      Family History  Problem Relation Age of Onset  . Cancer Mother        not sure what kind    Social History   Socioeconomic History  . Marital status: Single    Spouse name: Not on file  . Number of children: Not on file  . Years of education: 53  . Highest education level: 9th grade  Occupational History  . Not on file  Tobacco Use  . Smoking status: Never Smoker  . Smokeless tobacco: Never Used  Vaping Use  . Vaping Use: Never used  Substance and Sexual Activity  . Alcohol use: No  . Drug use: Not Currently    Types: Marijuana  . Sexual activity: Never  Other Topics Concern  . Not on file  Social History Narrative  . Not on file   Social Determinants of Health   Financial Resource Strain: Not on  file  Food Insecurity: Not on file  Transportation Needs: Not on file  Physical Activity: Not on file  Stress: Not on file  Social Connections: Not on file  Intimate Partner Violence: Not on file    ROS Review of Systems  Constitutional: Negative for appetite change, fatigue and unexpected weight change.  HENT: Negative for trouble swallowing.   Eyes: Negative for visual disturbance.  Respiratory: Negative for shortness of breath and wheezing.   Cardiovascular: Negative for chest pain, palpitations and leg swelling.  Gastrointestinal: Negative for blood in stool, constipation, diarrhea, nausea and vomiting.  Genitourinary: Positive for frequency. Negative for decreased urine volume, difficulty urinating, dysuria, flank pain and hematuria.  Skin: Negative for wound.  Allergic/Immunologic: Positive for environmental allergies.  Neurological: Negative for dizziness, facial asymmetry, speech difficulty, weakness, light-headedness, numbness and headaches.  Psychiatric/Behavioral: Negative for dysphoric mood and suicidal ideas.    Objective:   Today's Vitals: BP (!) 174/90   Pulse 81   Temp 98.2 F (36.8 C) (Temporal)   Ht $R'6\' 1"'BT$  (1.854 m)   Wt 269 lb 6 oz (122.2 kg)   BMI 35.54 kg/m   Physical Exam Vitals and nursing note reviewed.  Constitutional:      General: He is not in  acute distress.    Appearance: Normal appearance. He is not ill-appearing, toxic-appearing or diaphoretic.  HENT:     Head: Normocephalic and atraumatic.     Nose: Nose normal.     Mouth/Throat:     Mouth: Mucous membranes are moist.     Pharynx: Oropharynx is clear.  Eyes:     Extraocular Movements: Extraocular movements intact.     Conjunctiva/sclera: Conjunctivae normal.     Pupils: Pupils are equal, round, and reactive to light.  Neck:     Vascular: No carotid bruit.  Cardiovascular:     Rate and Rhythm: Normal rate and regular rhythm.     Heart sounds: Murmur heard.   Systolic murmur is  present with a grade of 2/6.   Abdominal:     General: Bowel sounds are normal.     Palpations: Abdomen is soft.     Tenderness: There is no abdominal tenderness. There is no right CVA tenderness or left CVA tenderness.  Musculoskeletal:     Cervical back: Neck supple. No tenderness.     Right lower leg: No edema.     Left lower leg: No edema.  Lymphadenopathy:     Cervical: No cervical adenopathy.  Skin:    General: Skin is warm and dry.  Neurological:     General: No focal deficit present.     Mental Status: He is alert and oriented to person, place, and time.     Motor: No weakness.  Psychiatric:        Mood and Affect: Mood normal.        Behavior: Behavior normal.        Thought Content: Thought content normal.        Judgment: Judgment normal.   Urine dipstick shows positive for RBC's.  Micro exam: 0-5 WBC's per HPF, 0-2 RBC's per HPF and few bacteria.   Assessment & Plan:  [place] was seen today for new patient (initial visit).  Diagnoses and all orders for this visit:  Type 2 diabetes mellitus without complication, unspecified whether long term insulin use (HCC) A1C 7.0 today, at goal. Restart metformin 500 mg daily. Will respart statin today as well. Labs pending as below. Diet and exercise.  -     CBC with Differential/Platelet -     CMP14+EGFR -     Lipid panel -     Bayer DCA Hb A1c Waived -     metFORMIN (GLUCOPHAGE XR) 500 MG 24 hr tablet; Take 1 tablet (500 mg total) by mouth daily with breakfast.  Essential hypertension, benign Uncontrolled. BP 174/88 today in office, 174/90 on repeat. No alarm symptoms. Restart amlodipine and HCTZ today. Follow up in 2 weeks.  -     CBC with Differential/Platelet -     CMP14+EGFR -     Lipid panel -     TSH       -     hydrochlorothiazide (HYDRODIURIL) 25 MG tablet; Take 1       tablet (25 mg total) by mouth daily. -     amLODipine (NORVASC) 10 MG tablet; Take 1 tablet (10 mg total) by mouth daily. for blood pressure -      Ambulatory referral to Cardiology  Hyperlipidemia, unspecified hyperlipidemia type Labs pending as below. Restart lipitor.  -     Lipid panel -     atorvastatin (LIPITOR) 20 MG tablet; Take 1 tablet (20 mg total) by mouth daily.   Class 2 severe obesity due  to excess calories with serious comorbidity and body mass index (BMI) of 35.0 to 35.9 in adult [facility]) Labs pending as below. Diet and exercise.  -     AMB Referral to [place] -     CBC with Differential/Platelet -     CMP14+EGFR -     Lipid panel -     TSH  Heart murmur Known history of heart murmur. Echo in 2018 showed mild LVH with EF of 55-60% and grade 1 diastolic dysfunction. Referral placed to cardiology.  -     Ambulatory referral to Cardiology  Frequent urination No other symptoms. UA unremarkable except few bacteria. ?contaminated specimen. Can reassess at next visit.  -     Urinalysis, Routine w reflex microscopic -     Microscopic Examination  Seasonal allergies -     fluticasone (FLONASE) 50 MCG/ACT nasal spray; Place 2 sprays into both nostrils daily.  Need for immunization against influenza Vaccine today in office. -     Flu Vaccine QUAD High Dose(Fluad)  Need for vaccination for pneumococcus Vaccine today in office. -     Pneumococcal conjugate vaccine 13-valent  Encounter to establish care Review previous records that were available in [place].    Follow-up: Return in about 2 weeks (around 12/07/2020) for BP follow up.   The patient indicates understanding of these issues and agrees with the plan.  Gwenlyn Perking, FNP

## 2020-11-23 NOTE — Patient Instructions (Signed)
[email or web address]">  DASH Eating Plan DASH stands for Dietary Approaches to Stop Hypertension. The DASH eating plan is a healthy eating plan that has been shown to:  Reduce high blood pressure (hypertension).  Reduce your risk for type 2 diabetes, heart disease, and stroke.  Help with weight loss. What are tips for following this plan? Reading food labels  Check food labels for the amount of salt (sodium) per serving. Choose foods with less than 5 percent of the Daily Value of sodium. Generally, foods with less than 300 milligrams (mg) of sodium per serving fit into this eating plan.  To find whole grains, look for the word "whole" as the first word in the ingredient list. Shopping  Buy products labeled as "low-sodium" or "no salt added."  Buy fresh foods. Avoid canned foods and pre-made or frozen meals. Cooking  Avoid adding salt when cooking. Use salt-free seasonings or herbs instead of table salt or sea salt. Check with your health care provider or pharmacist before using salt substitutes.  Do not fry foods. Cook foods using healthy methods such as baking, boiling, grilling, roasting, and broiling instead.  Cook with heart-healthy oils, such as olive, canola, avocado, soybean, or sunflower oil. Meal planning  Eat a balanced diet that includes: ? 4 or more servings of fruits and 4 or more servings of vegetables each day. Try to fill one-half of your plate with fruits and vegetables. ? 6-8 servings of whole grains each day. ? Less than 6 oz (170 g) of lean meat, poultry, or fish each day. A 3-oz (85-g) serving of meat is about the same size as a deck of cards. One egg equals 1 oz (28 g). ? 2-3 servings of low-fat dairy each day. One serving is 1 cup (237 mL). ? 1 serving of nuts, seeds, or beans 5 times each week. ? 2-3 servings of heart-healthy fats. Healthy fats called omega-3 fatty acids are found in foods such as walnuts,  flaxseeds, fortified milks, and eggs. These fats are also found in cold-water fish, such as sardines, salmon, and mackerel.  Limit how much you eat of: ? Canned or prepackaged foods. ? Food that is high in trans fat, such as some fried foods. ? Food that is high in saturated fat, such as fatty meat. ? Desserts and other sweets, sugary drinks, and other foods with added sugar. ? Full-fat dairy products.  Do not salt foods before eating.  Do not eat more than 4 egg yolks a week.  Try to eat at least 2 vegetarian meals a week.  Eat more home-cooked food and less restaurant, buffet, and fast food.   Lifestyle  When eating at a restaurant, ask that your food be prepared with less salt or no salt, if possible.  If you drink alcohol: ? Limit how much you use to:  0-1 drink a day for women who are not pregnant.  0-2 drinks a day for men. ? Be aware of how much alcohol is in your drink. In the [place] one drink equals one 12 oz bottle of beer (355 mL), one 5 oz glass of wine (148 mL), or one 1 oz glass of hard liquor (44 mL). General information  Avoid eating more than 2,300 mg of salt a day. If you have hypertension, you may need to reduce your sodium intake to 1,500 mg a day.  Work with your health care provider to maintain a healthy body weight or to lose weight. Ask what an ideal weight is for  you.  Get at least 30 minutes of exercise that causes your heart to beat faster (aerobic exercise) most days of the week. Activities may include walking, swimming, or biking.  Work with your health care provider or dietitian to adjust your eating plan to your individual calorie needs. What foods should I eat? Fruits All fresh, dried, or frozen fruit. Canned fruit in natural juice (without added sugar). Vegetables Fresh or frozen vegetables (raw, steamed, roasted, or grilled). Low-sodium or reduced-sodium tomato and vegetable juice. Low-sodium or reduced-sodium tomato sauce and tomato paste.  Low-sodium or reduced-sodium canned vegetables. Grains Whole-grain or whole-wheat bread. Whole-grain or whole-wheat pasta. Brown rice. [place]. Bulgur. Whole-grain and low-sodium cereals. Pita bread. Low-fat, low-sodium crackers. Whole-wheat flour tortillas. Meats and other proteins Skinless chicken or [place]. Ground chicken or [place]. Pork with fat trimmed off. Fish and seafood. Egg whites. Dried beans, peas, or lentils. Unsalted nuts, nut butters, and seeds. Unsalted canned beans. Lean cuts of beef with fat trimmed off. Low-sodium, lean precooked or cured meat, such as sausages or meat loaves. Dairy Low-fat (1%) or fat-free (skim) milk. Reduced-fat, low-fat, or fat-free cheeses. Nonfat, low-sodium ricotta or cottage cheese. Low-fat or nonfat yogurt. Low-fat, low-sodium cheese. Fats and oils Soft margarine without trans fats. Vegetable oil. Reduced-fat, low-fat, or light mayonnaise and salad dressings (reduced-sodium). Canola, safflower, olive, avocado, soybean, and sunflower oils. Avocado. Seasonings and condiments Herbs. Spices. Seasoning mixes without salt. Other foods Unsalted popcorn and pretzels. Fat-free sweets. The items listed above may not be a complete list of foods and beverages you can eat. Contact a dietitian for more information. What foods should I avoid? Fruits Canned fruit in a light or heavy syrup. Fried fruit. Fruit in cream or butter sauce. Vegetables Creamed or fried vegetables. Vegetables in a cheese sauce. Regular canned vegetables (not low-sodium or reduced-sodium). Regular canned tomato sauce and paste (not low-sodium or reduced-sodium). Regular tomato and vegetable juice (not low-sodium or reduced-sodium). Angie Fava. Olives. Grains Baked goods made with fat, such as croissants, muffins, or some breads. Dry pasta or rice meal packs. Meats and other proteins Fatty cuts of meat. Ribs. Fried meat. Berniece Salines. Bologna, salami, and other precooked or cured meats, such as  sausages or meat loaves. Fat from the back of a pig (fatback). Bratwurst. Salted nuts and seeds. Canned beans with added salt. Canned or smoked fish. Whole eggs or egg yolks. Chicken or [place] with skin. Dairy Whole or 2% milk, cream, and half-and-half. Whole or full-fat cream cheese. Whole-fat or sweetened yogurt. Full-fat cheese. Nondairy creamers. Whipped toppings. Processed cheese and cheese spreads. Fats and oils Butter. Stick margarine. Lard. Shortening. Ghee. Bacon fat. Tropical oils, such as coconut, palm kernel, or palm oil. Seasonings and condiments Onion salt, garlic salt, seasoned salt, table salt, and sea salt. Worcestershire sauce. Tartar sauce. Barbecue sauce. Teriyaki sauce. Soy sauce, including reduced-sodium. Steak sauce. Canned and packaged gravies. Fish sauce. Oyster sauce. Cocktail sauce. Store-bought horseradish. Ketchup. Mustard. Meat flavorings and tenderizers. Bouillon cubes. Hot sauces. Pre-made or packaged marinades. Pre-made or packaged taco seasonings. Relishes. Regular salad dressings. Other foods Salted popcorn and pretzels. The items listed above may not be a complete list of foods and beverages you should avoid. Contact a dietitian for more information. Where to find more information  National Heart, Lung, and Blood Institute: https://wilson-eaton.com/  American Heart Association: www.heart.org  Academy of Nutrition and Dietetics: www.eatright.[place]: www.kidney.org Summary  The DASH eating plan is a healthy eating plan that has been shown to  reduce high blood pressure (hypertension). It may also reduce your risk for type 2 diabetes, heart disease, and stroke.  When on the DASH eating plan, aim to eat more fresh fruits and vegetables, whole grains, lean proteins, low-fat dairy, and heart-healthy fats.  With the DASH eating plan, you should limit salt (sodium) intake to 2,300 mg a day. If you have hypertension, you may need to reduce your  sodium intake to 1,500 mg a day.  Work with your health care provider or dietitian to adjust your eating plan to your individual calorie needs. This information is not intended to replace advice given to you by your health care provider. Make sure you discuss any questions you have with your health care provider. Document Revised: 09/23/2019 Document Reviewed: 09/23/2019 Elsevier Patient Education  2021 [employer].

## 2020-11-23 NOTE — Chronic Care Management (AMB) (Signed)
Chronic Care Management  ° °Note ° °11/23/2020 °Name: Jack Cherry MRN: [id number] DOB: 08/02/1956 ° °Jack Cherry is a 64 y.o. year old male who is a primary care patient of Morgan, Tiffany M, FNP. I reached out to Jack Cherry by phone today in response to a referral sent by Jack Cherry's PCP, Morgan, Tiffany M, FNP. ° °Jack Cherry was given information about Chronic Care Management services today including:  °1. CCM service includes personalized support from designated clinical staff supervised by his physician, including individualized plan of care and coordination with other care providers °2. 24/7 contact phone numbers for assistance for urgent and routine care needs. °3. Service will only be billed when office clinical staff spend 20 minutes or more in a month to coordinate care. °4. Only one practitioner may furnish and bill the service in a calendar month. °5. The patient may stop CCM services at any time (effective at the end of the month) by phone call to the office staff. °6. The patient will be responsible for cost sharing (co-pay) of up to 20% of the service fee (after annual deductible is met). ° °Patient agreed to services and verbal consent obtained.  ° °Follow up plan: °Telephone appointment with care management team member scheduled for:12/03/2020 ° °Jack Cherry  °Care Guide, Embedded Care Coordination °[facility]  °Direct Dial: [phone] ° °

## 2020-11-24 LAB — CBC WITH DIFFERENTIAL/PLATELET
Basophils Absolute: 0.1 10*3/uL (ref 0.0–0.2)
Basos: 2 %
EOS (ABSOLUTE): 0.1 10*3/uL (ref 0.0–0.4)
Eos: 2 %
Hematocrit: 39.7 % (ref 37.5–51.0)
Hemoglobin: 13.5 g/dL (ref 13.0–17.7)
Immature Grans (Abs): 0 10*3/uL (ref 0.0–0.1)
Immature Granulocytes: 1 %
Lymphocytes Absolute: 1.4 10*3/uL (ref 0.7–3.1)
Lymphs: 37 %
MCH: 31 pg (ref 26.6–33.0)
MCHC: 34 g/dL (ref 31.5–35.7)
MCV: 91 fL (ref 79–97)
Monocytes Absolute: 0.3 10*3/uL (ref 0.1–0.9)
Monocytes: 8 %
Neutrophils Absolute: 1.9 10*3/uL (ref 1.4–7.0)
Neutrophils: 50 %
Platelets: 227 10*3/uL (ref 150–450)
RBC: 4.35 x10E6/uL (ref 4.14–5.80)
RDW: 13.4 % (ref 11.6–15.4)
WBC: 3.7 10*3/uL (ref 3.4–10.8)

## 2020-11-24 LAB — CMP14+EGFR
ALT: 30 IU/L (ref 0–44)
AST: 27 IU/L (ref 0–40)
Albumin/Globulin Ratio: 1.6 (ref 1.2–2.2)
Albumin: 4.5 g/dL (ref 3.8–4.8)
Alkaline Phosphatase: 75 IU/L (ref 44–121)
BUN/Creatinine Ratio: 13 (ref 10–24)
BUN: 14 mg/dL (ref 8–27)
Bilirubin Total: 0.3 mg/dL (ref 0.0–1.2)
CO2: 21 mmol/L (ref 20–29)
Calcium: 10 mg/dL (ref 8.6–10.2)
Chloride: 103 mmol/L (ref 96–106)
Creatinine, Ser: 1.08 mg/dL (ref 0.76–1.27)
GFR calc Af Amer: 83 mL/min/{1.73_m2} (ref >59)
GFR calc non Af Amer: 72 mL/min/{1.73_m2} (ref >59)
Globulin, Total: 2.8 g/dL (ref 1.5–4.5)
Glucose: 133 mg/dL — ABNORMAL HIGH (ref 65–99)
Potassium: 4.3 mmol/L (ref 3.5–5.2)
Sodium: 140 mmol/L (ref 134–144)
Total Protein: 7.3 g/dL (ref 6.0–8.5)

## 2020-11-24 LAB — LIPID PANEL
Chol/HDL Ratio: 5.8 ratio — ABNORMAL HIGH (ref 0.0–5.0)
Cholesterol, Total: 230 mg/dL — ABNORMAL HIGH (ref 100–199)
HDL: 40 mg/dL (ref >39)
LDL Chol Calc (NIH): 159 mg/dL — ABNORMAL HIGH (ref 0–99)
Triglycerides: 171 mg/dL — ABNORMAL HIGH (ref 0–149)
VLDL Cholesterol Cal: 31 mg/dL (ref 5–40)

## 2020-11-24 LAB — TSH: TSH: 1.87 u[IU]/mL (ref 0.450–4.500)

## 2020-12-03 ENCOUNTER — Ambulatory Visit (INDEPENDENT_AMBULATORY_CARE_PROVIDER_SITE_OTHER): Payer: Medicare Other | Admitting: *Deleted

## 2020-12-03 DIAGNOSIS — I10 Essential (primary) hypertension: Secondary | ICD-10-CM

## 2020-12-03 DIAGNOSIS — E119 Type 2 diabetes mellitus without complications: Secondary | ICD-10-CM

## 2020-12-07 ENCOUNTER — Encounter: Payer: Self-pay | Admitting: Family Medicine

## 2020-12-07 ENCOUNTER — Ambulatory Visit (INDEPENDENT_AMBULATORY_CARE_PROVIDER_SITE_OTHER): Payer: Medicare Other | Admitting: Family Medicine

## 2020-12-07 ENCOUNTER — Other Ambulatory Visit: Payer: Self-pay

## 2020-12-07 VITALS — BP 122/73 | HR 84 | Temp 97.8°F | Ht 73.0 in | Wt 261.5 lb

## 2020-12-07 DIAGNOSIS — R350 Frequency of micturition: Secondary | ICD-10-CM | POA: Diagnosis not present

## 2020-12-07 DIAGNOSIS — I10 Essential (primary) hypertension: Secondary | ICD-10-CM | POA: Diagnosis not present

## 2020-12-07 DIAGNOSIS — E119 Type 2 diabetes mellitus without complications: Secondary | ICD-10-CM | POA: Diagnosis not present

## 2020-12-07 NOTE — Progress Notes (Signed)
.   Established Patient Office Visit  Subjective:  Patient ID: Jack Cherry, male    DOB: 11-12-1955  Age: 65 y.o. MRN: [id number]  CC:  Chief Complaint  Patient presents with  . Medical Management of Chronic Issues    HPI Jack Cherry presents for chronic follow up.   1. 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 No medication side effects.  Family, social, and smoking history reviewed.   BP Readings from Last 3 Encounters:  12/07/20 122/73  11/23/20 (!) 174/90  02/28/20 (!) 138/96   CMP Latest Ref Rng & Units 11/23/2020 02/14/2020 11/30/2019  Glucose 65 - 99 mg/dL 133(H) 106(H) 121(H)  BUN 8 - 27 mg/dL 14 15 51(H)  Creatinine 0.76 - 1.27 mg/dL 1.08 0.95 1.18  Sodium 134 - 144 mmol/L 140 141 139  Potassium 3.5 - 5.2 mmol/L 4.3 3.7 4.1  Chloride 96 - 106 mmol/L 103 107 105  CO2 20 - 29 mmol/L 21 26 25   Calcium 8.6 - 10.2 mg/dL 10.0 9.1 8.9  Total Protein 6.0 - 8.5 g/dL 7.3 7.1 6.8  Total Bilirubin 0.0 - 1.2 mg/dL 0.3 0.6 0.5  Alkaline Phos 44 - 121 IU/L 75 60 40  AST 0 - 40 IU/L 27 27 49(H)  ALT 0 - 44 IU/L 30 29 33    2. T2DM On metformin daily. A1c was 7 at last visit. He does not check his blood sugar at home and does not wish to. Needs to schedule eye exam.   Past Medical History:  Diagnosis Date  . 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 Diabetes ([place])   . High cholesterol   . Hypertension     Past Surgical History:  Procedure Laterality Date  . COLONOSCOPY N/A 01/08/2018   Dr. Oneida Alar three 3-8 mm pol;yps, simple adenomas. internal hemorrhoids. next colonoscopy in  3 years  . ESOPHAGOGASTRODUODENOSCOPY N/A 01/08/2018   Dr. Oneida Alar: normal esophagus, mild gastritis/duodenitis, H.pylori  . GIVENS CAPSULE STUDY N/A 12/21/2018   Procedure: GIVENS CAPSULE STUDY;  Surgeon: Danie Binder, MD;  Location: [facility];  Service: Endoscopy;  Laterality: N/A;  7:30am  . HERNIA REPAIR      Family History  Problem Relation Age of Onset  . Cancer Mother        not sure what kind    Social History   Socioeconomic History  . Marital status: Single    Spouse name: Not on file  . Number of children: Not on file  . Years of education: 24  . Highest education level: 9th grade  Occupational History  . Not on file  Tobacco Use  . Smoking status: Never Smoker  . Smokeless tobacco: Never Used  Vaping Use  . Vaping Use: Never used  Substance and Sexual Activity  . Alcohol use: No  . Drug use: Not Currently    Types: Marijuana  . Sexual activity: Never  Other Topics Concern  . Not on file  Social History Narrative  . Not on file   Social Determinants of Health   Financial Resource  Strain: Not on file  Food Insecurity: Not on file  Transportation Needs: Not on file  Physical Activity: Not on file  Stress: Not on file  Social Connections: Not on file  Intimate Partner Violence: Not on file    Outpatient Medications Prior to Visit  Medication Sig Dispense Refill  . amLODipine (NORVASC) 10 MG tablet Take 1 tablet (10 mg total) by mouth daily. for blood pressure 90 tablet 1  . atorvastatin (LIPITOR) 20 MG tablet Take 1 tablet (20 mg total) by mouth daily. 90 tablet 1  . fluticasone (FLONASE) 50 MCG/ACT nasal spray Place 2 sprays into both nostrils daily. 16 g 6  . hydrochlorothiazide (HYDRODIURIL) 25 MG tablet Take 1 tablet (25 mg total) by mouth daily. 90 tablet 1  . metFORMIN (GLUCOPHAGE XR) 500 MG 24 hr tablet Take 1 tablet (500 mg total) by mouth daily with breakfast. 90 tablet 1   No facility-administered medications prior to visit.    No Known  Allergies  ROS Review of Systems Negative unless specially indicated above in HPI.   Objective:    Physical Exam Vitals and nursing note reviewed.  Constitutional:      Appearance: Normal appearance.  HENT:     Head: Normocephalic and atraumatic.  Neck:     Vascular: No carotid bruit.  Cardiovascular:     Rate and Rhythm: Normal rate and regular rhythm.     Heart sounds: Normal heart sounds. No murmur heard.   Pulmonary:     Effort: Pulmonary effort is normal. No respiratory distress.     Breath sounds: Normal breath sounds.  Abdominal:     General: Bowel sounds are normal.     Palpations: Abdomen is soft.     Tenderness: There is no right CVA tenderness or left CVA tenderness.  Musculoskeletal:     Cervical back: Neck supple. No tenderness.     Right lower leg: No edema.     Left lower leg: No edema.  Skin:    General: Skin is warm and dry.  Neurological:     General: No focal deficit present.     Mental Status: He is alert and oriented to person, place, and time.  Psychiatric:        Mood and Affect: Mood normal.        Behavior: Behavior normal.    Diabetic Foot Exam - Simple   Simple Foot Form Diabetic Foot exam was performed with the following findings: Yes 12/07/2020 10:10 AM  Visual Inspection No deformities, no ulcerations, no other skin breakdown bilaterally: Yes Sensation Testing Intact to touch and monofilament testing bilaterally: Yes Pulse Check Posterior Tibialis and Dorsalis pulse intact bilaterally: Yes Comments      BP 122/73   Pulse 84   Temp 97.8 F (36.6 C) (Temporal)   Ht 6\' 1"  (1.854 m)   Wt 261 lb 8 oz (118.6 kg)   BMI 34.50 kg/m  Wt Readings from Last 3 Encounters:  12/07/20 261 lb 8 oz (118.6 kg)  11/23/20 269 lb 6 oz (122.2 kg)  02/28/20 260 lb (117.9 kg)     Health Maintenance Due  Topic Date Due  . Hepatitis C Screening  Never done  . TETANUS/TDAP  Never done  . OPHTHALMOLOGY EXAM  01/19/2019  . COLON CANCER  SCREENING ANNUAL FOBT  12/07/2019  . FOOT EXAM  07/18/2020  . COVID-19 Vaccine (3 - Booster for Moderna series) 09/13/2020  . URINE MICROALBUMIN  10/16/2020    There are no  preventive care reminders to display for this patient.  Lab Results  Component Value Date   TSH 1.870 11/23/2020   Lab Results  Component Value Date   WBC 3.7 11/23/2020   HGB 13.5 11/23/2020   HCT 39.7 11/23/2020   MCV 91 11/23/2020   PLT 227 11/23/2020   Lab Results  Component Value Date   NA 140 11/23/2020   K 4.3 11/23/2020   CO2 21 11/23/2020   GLUCOSE 133 (H) 11/23/2020   BUN 14 11/23/2020   CREATININE 1.08 11/23/2020   BILITOT 0.3 11/23/2020   ALKPHOS 75 11/23/2020   AST 27 11/23/2020   ALT 30 11/23/2020   PROT 7.3 11/23/2020   ALBUMIN 4.5 11/23/2020   CALCIUM 10.0 11/23/2020   ANIONGAP 8 02/14/2020   Lab Results  Component Value Date   CHOL 230 (H) 11/23/2020   Lab Results  Component Value Date   HDL 40 11/23/2020   Lab Results  Component Value Date   LDLCALC 159 (H) 11/23/2020   Lab Results  Component Value Date   TRIG 171 (H) 11/23/2020   Lab Results  Component Value Date   CHOLHDL 5.8 (H) 11/23/2020   Lab Results  Component Value Date   HGBA1C 7.0 (H) 11/23/2020      Assessment & Plan:   [place] was seen today for hypertension.  Diagnoses and all orders for this visit:  Essential hypertension, benign At goal today. Well controlled on current regimen.   Type 2 diabetes mellitus without complication, unspecified whether long term insulin use (HCC) Last A1c at goal. Foot exam today. Microalbumin pending. Reminded patient to schedule eye exam.  -     Microalbumin / creatinine urine ratio  Urinary frequency Last sample was contaminated, discussed clean catch in detail. Will run new UA today and obtain culture. Discussed possibility of enlarged prostate as cause for symptoms, he would prefer to defer exam to next appointment for now.  -     Urinalysis, Complete -      Urine Culture   Follow-up: Return in about 3 months (around 03/06/2021) for chronic follow up.   The patient indicates understanding of these issues and agrees with the plan.  Gwenlyn Perking, FNP

## 2020-12-08 LAB — MICROALBUMIN / CREATININE URINE RATIO
Creatinine, Urine: 136 mg/dL
Microalb/Creat Ratio: 12 mg/g creat (ref 0–29)
Microalbumin, Urine: 16.6 ug/mL

## 2020-12-10 ENCOUNTER — Other Ambulatory Visit: Payer: Self-pay | Admitting: Family Medicine

## 2020-12-10 DIAGNOSIS — N3090 Cystitis, unspecified without hematuria: Secondary | ICD-10-CM

## 2020-12-10 MED ORDER — CEPHALEXIN 500 MG PO CAPS: 500.0000 mg | ORAL_CAPSULE | Freq: Two times a day (BID) | ORAL | 0 refills | Status: DC

## 2020-12-10 NOTE — Patient Instructions (Signed)
Visit Information  PATIENT GOALS:  Goals Addressed            This Visit's Progress   . Monitor and Manage My Blood Sugar-Diabetes Type 2       Timeframe:  Long-Range Goal Priority:  Low Start Date:  12/03/20                           Expected End Date: 06/02/21                      Follow Up Date 12/17/2020    . Get a blood sugar meter . Check blood sugar everyday before breakfast . Write blood sugar down in a log . Take log to appointments with Jack Smolder, FNP . Call PCP office [phone] if your blood sugar is below 70 or above 200   Why is this important?    Checking your blood sugar at home helps to keep it from getting very high or very low.   Writing the results in a diary or log helps the doctor know how to care for you.   Your blood sugar log should have the time, date and the results.   Also, write down the amount of insulin or other medicine that you take.   Other information, like what you ate, exercise done and how you were feeling, will also be helpful.     Notes:     . Track and Manage My Blood Pressure-Hypertension       Timeframe:  Long-Range Goal Priority:  Medium Start Date:   12/03/2020                          Expected End Date:   06/02/2021                    Follow Up Date 12/17/2020   Get a blood pressure monitor Check blood pressure 3 times a week  Write blood pressure down in log Take log with you to appointment with PCP Jack Cherry, [place]) Call PCP if your blood pressure is more than 145/90   Why is this important?    You won't feel high blood pressure, but it can still hurt your blood vessels.   High blood pressure can cause heart or kidney problems. It can also cause a stroke.   Making lifestyle changes like losing a little weight or eating less salt will help.   Checking your blood pressure at home and at different times of the day can help to control blood pressure.   If the doctor prescribes medicine remember to take it  the way the doctor ordered.   Call the office if you cannot afford the medicine or if there are questions about it.     Notes:        Consent to CCM Services: Mr. Jack Cherry was given information about Chronic Care Management services today including:  1. CCM service includes personalized support from designated clinical staff supervised by his physician, including individualized plan of care and coordination with other care providers 2. 24/7 contact phone numbers for assistance for urgent and routine care needs. 3. Service will only be billed when office clinical staff spend 20 minutes or more in a month to coordinate care. 4. Only one practitioner may furnish and bill the service in a calendar month. 5. The patient may stop CCM services at any time (  effective at the end of the month) by phone call to the office staff. 6. The patient will be responsible for cost sharing (co-pay) of up to 20% of the service fee (after annual deductible is met).  Patient agreed to services and verbal consent obtained.   Patient verbalizes understanding of instructions provided today and agrees to view in [place].   Follow Up Plan:  Telephone follow up appointment with care management team member scheduled for: 12/17/20 with RN Care Manager The patient has been provided with contact information for the care management team and has been advised to call with any health related questions or concerns.  Next PCP appointment scheduled for: 12/07/20 with Jack Smolder, FNP  Chong Sicilian, BSN, RN-BC Embedded Chronic Care Manager [facility] [place] Management Direct Dial: [phone]   CLINICAL CARE PLAN: Patient Care Plan: RNCM: Diabetes Type 2 (Adult)    Problem Identified: Glycemic Management (Diabetes, Type 2)   Priority: Low    Long-Range Goal: Glycemic Management Optimized   This Visit's Progress: Not on track  Priority: Low  Note:   Current Barriers:  . Chronic Disease  Management support and education needs related to diabetes management . Lacks caregiver support.  . Film/video editor.  . Literacy barriers . Transportation barriers . Unable to perform IADLs independently  Nurse Case Manager Clinical Goal(s):  Marland Kitchen Over the next 30 days, patient will verbalize understanding of plan for diabetes management . Over the next 30 days, patient will work with Consulting civil engineer to address needs related to self-management of diabetes  Interventions:  . 1:1 collaboration with Gwenlyn Perking, FNP regarding development and update of comprehensive plan of care as evidenced by provider attestation and co-signature . Inter-disciplinary care team collaboration (see longitudinal plan of care) . Evaluation of current treatment plan related to diabetes and patient's adherence to plan as established by provider. . Chart reviewed including relevant office notes and lab results . Discussed recent office visit with Jack Smolder, FNP . Discussed recent lab results and A1C of 7 . Reviewed and discussed medications:  o Explained how metformin works and that it shouldn't drop his blood sugar too low o Patient was able to purchase medication o Compliant with medication . Discussed home blood sugar testing o Does not have a meter and has never checked his blood sugar o Explained that it's not absolutely necessary to monitor for hypoglycemia while on metformin but it would be good to monitor for hyperglycemia to see how well it's working o Not interested in monitoring right now but will consider . Reviewed upcoming appointment with PCP on 12/07/20 . Discussed family and social support o Lives with nephew o Sister, Jack Cherry, is an emergency contact o Jack Cherry and Jack Cherry are friends that help with transportation if needed and helped him figure out that he had Medicare part A&B . Discussed insurance coverage o Medicare Part A&B o Does not have prescription drug  coverage o Recommended Part D, prescription drug coverage o Talked with Sherrine Maples, per patient's request, regarding prescription drug coverage - Provided Jack Cherry with info for [facility] contact, June Carlson [phone]. He will reach out to her regarding Part D, Medicare Extra Help, and Medicaid . Provided patient and Jack Cherry with RNCM contact number and encouraged to reach out as needed  Patient Goals/Self-Care Activities Over the next 30 days, patient will: Marland Kitchen Get a blood sugar meter . Check blood sugar everyday before breakfast . Write blood sugar down in a log .  Take log to appointments with Jack Smolder, FNP . Call PCP office [phone] if your blood sugar is below 70 or above 200  Follow Up Plan:  Telephone follow up appointment with care management team member scheduled for: 12/17/20 with RN Care Manager The patient has been provided with contact information for the care management team and has been advised to call with any health related questions or concerns.  Next PCP appointment scheduled for: 12/07/20 with Jack Smolder, FNP       Patient Care Plan: RNCM: Hypertension (Adult)    Problem Identified: Hypertension (Hypertension) Resolved 12/03/2020    Goal: Hypertension Monitored Completed 12/03/2020  Note:   Evidence-based guidance:   Promote initial use of ambulatory blood pressure measurements (for 3 days) to rule out "white-coat" effect; identify masked hypertension and presence or absence of nocturnal "dipping" of blood pressure.    Encourage continued use of home blood pressure monitoring and recording in blood pressure log; include symptoms of hypotension or potential medication side effects in log.   Review blood pressure measurements taken inside and outside of the provider office; establish baseline and monitor trends; compare to target ranges or patient goal.   Share overall cardiovascular risk with patient; encourage changes to lifestyle risk factors, including  alcohol consumption, smoking, inadequate exercise, poor dietary habits and stress.   Notes:    Problem Identified: Hypertension   Priority: Medium    Long-Range Goal: Monitor Blood Pressure   This Visit's Progress: Not on track  Priority: Medium  Note:   Current Barriers:  . Chronic Disease Management support and education needs related to hypertension . Lacks caregiver support.  . Film/video editor.  . Literacy barriers . Transportation barriers . Unable to perform IADLs independently  Nurse Case Manager Clinical Goal(s):  Marland Kitchen Over the next 30 days, patient will verbalize understanding of plan for hypertension management . Over the next 30 days, patient will work with Consulting civil engineer to address needs related to self-management of hypertension  Interventions:  . 1:1 collaboration with Gwenlyn Perking, FNP regarding development and update of comprehensive plan of care as evidenced by provider attestation and co-signature . Inter-disciplinary care team collaboration (see longitudinal plan of care) . Evaluation of current treatment plan related to hypertension and patient's adherence to plan as established by provider. . Chart reviewed including relevant office notes and lab results . Discussed in-office blood pressure reading . Discussed blood pressure monitoring o Patient does not have a monitor and does not check blood pressure . Reviewed and discussed medications o Was able to purchase medications and is taking them regularly . Encouraged patient to obtain a blood pressure monitor and to check and record blood pressure at least 3 times a week . Advised to call PCP with any readings outside of recommended range . Discussed diet o Tries to eat "healthy" o Reinforced DASH diet . Provided with RN Care Manager contact number and encourage to reach out as needed . Reviewed upcoming appointment with PCP for BP rck on 12/07/20  Patient Goals/Self-Care Activities Over the next 30  days, patient will: Marland Kitchen Get a blood pressure monitor . Check blood pressure 3 times a week  . Write blood pressure down in log . Take log with you to appointment with PCP Jack Cherry, [place]) . Call PCP if your blood pressure is more than 145/90  Follow Up Plan:  Telephone follow up appointment with care management team member scheduled for: 12/17/20 with RN Care Manager The patient has been  provided with contact information for the care management team and has been advised to call with any health related questions or concerns.  Next PCP appointment scheduled for: 12/07/20 with Jack Smolder, FNP

## 2020-12-10 NOTE — Chronic Care Management (AMB) (Signed)
Chronic Care Management   CCM RN Visit Note  12/03/20 Name: Jack Cherry MRN: [id number] DOB: 1956-03-21  Subjective: Jack Cherry is a 65 y.o. year old male who is a primary care patient of Jack Perking, FNP. The care management team was consulted for assistance with disease management and care coordination needs.    Engaged with patient by telephone for initial visit in response to provider referral for case management and/or care coordination services.   Consent to Services:  The patient was given the following information about Chronic Care Management services today, agreed to services, and gave verbal consent: 1. CCM service includes personalized support from designated clinical staff supervised by the primary care provider, including individualized plan of care and coordination with other care providers 2. 24/7 contact phone numbers for assistance for urgent and routine care needs. 3. Service will only be billed when office clinical staff spend 20 minutes or more in a month to coordinate care. 4. Only one practitioner may furnish and bill the service in a calendar month. 5.The patient may stop CCM services at any time (effective at the end of the month) by phone call to the office staff. 6. The patient will be responsible for cost sharing (co-pay) of up to 20% of the service fee (after annual deductible is met). Patient agreed to services and consent obtained.  Patient agreed to services and verbal consent obtained.   Assessment: Review of patient past medical history, allergies, medications, health status, including review of consultants reports, laboratory and other test data, was performed as part of comprehensive evaluation and provision of chronic care management services.   SDOH (Social Determinants of Health) assessments and interventions performed:  Yes  CCM Care Plan  No Known Allergies  Outpatient Encounter Medications as of 12/03/2020  Medication Sig  . amLODipine  (NORVASC) 10 MG tablet Take 1 tablet (10 mg total) by mouth daily. for blood pressure  . atorvastatin (LIPITOR) 20 MG tablet Take 1 tablet (20 mg total) by mouth daily.  . fluticasone (FLONASE) 50 MCG/ACT nasal spray Place 2 sprays into both nostrils daily.  . hydrochlorothiazide (HYDRODIURIL) 25 MG tablet Take 1 tablet (25 mg total) by mouth daily.  . metFORMIN (GLUCOPHAGE XR) 500 MG 24 hr tablet Take 1 tablet (500 mg total) by mouth daily with breakfast.   No facility-administered encounter medications on file as of 12/03/2020.    Patient Active Problem List   Diagnosis Date Noted  . Normocytic anemia 12/08/2017  . 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 Premature contractions, atrial 05/19/2016  . Heart murmur 05/19/2016  . Type 2 diabetes mellitus without complication ([place]) 86/57/8469  . Hyperlipidemia 01/21/2016  . Obesity, unspecified 01/21/2016  . Essential hypertension, benign 09/24/2015    Conditions to be addressed/monitored:HTN, HLD and DMII  Patient Care Plan: RNCM: Diabetes Type 2 (Adult)    Problem Identified: Glycemic Management (Diabetes, Type 2)   Priority: Low    Long-Range Goal: Glycemic Management Optimized   This Visit's Progress: Not on track  Priority: Low  Note:   Current Barriers:  . Chronic Disease Management support and education needs related to diabetes management . Lacks caregiver support.  . Film/video editor.  . Literacy barriers . Transportation barriers . Unable to perform IADLs independently  Nurse Case Manager Clinical Goal(s):  Marland Kitchen Over the next 30 days, patient will verbalize understanding of plan for diabetes management . Over the next 30 days, patient will work with Consulting civil engineer to address needs related  to self-management of diabetes  Interventions:  . 1:1 collaboration with Jack Perking, FNP regarding development and update of comprehensive plan of care as evidenced by provider attestation and  co-signature . Inter-disciplinary care team collaboration (see longitudinal plan of care) . Evaluation of current treatment plan related to diabetes and patient's adherence to plan as established by provider. . Chart reviewed including relevant office notes and lab results . Discussed recent office visit with Jack Smolder, FNP . Discussed recent lab results and A1C of 7 . Reviewed and discussed medications:  o Explained how metformin works and that it shouldn't drop his blood sugar too low o Patient was able to purchase medication o Compliant with medication . Discussed home blood sugar testing o Does not have a meter and has never checked his blood sugar o Explained that it's not absolutely necessary to monitor for hypoglycemia while on metformin but it would be good to monitor for hyperglycemia to see how well it's working o Not interested in monitoring right now but will consider . Reviewed upcoming appointment with PCP on 12/07/20 . Discussed family and social support o Lives with nephew o Sister, Jack Cherry, is an emergency contact o Jack Cherry and Jack Cherry are friends that help with transportation if needed and helped him figure out that he had Medicare part A&B . Discussed insurance coverage o Medicare Part A&B o Does not have prescription drug coverage o Recommended Part D, prescription drug coverage o Talked with Jack Cherry, per patient's request, regarding prescription drug coverage - Provided Jack Cherry with info for [facility] contact, Jack Cherry [phone]. He will reach out to her regarding Part D, Medicare Extra Help, and Medicaid . Provided patient and Jack Cherry with RNCM contact number and encouraged to reach out as needed  Patient Goals/Self-Care Activities Over the next 30 days, patient will: Marland Kitchen Get a blood sugar meter . Check blood sugar everyday before breakfast . Write blood sugar down in a log . Take log to appointments with Jack Smolder, FNP . Call PCP office [phone] if  your blood sugar is below 70 or above 200     Patient Care Plan: RNCM: Hypertension (Adult)    Problem Identified: Hypertension   Priority: Medium    Long-Range Goal: Monitor Blood Pressure   This Visit's Progress: Not on track  Priority: Medium  Note:   Current Barriers:  . Chronic Disease Management support and education needs related to hypertension . Lacks caregiver support.  . Film/video editor.  . Literacy barriers . Transportation barriers . Unable to perform IADLs independently  Nurse Case Manager Clinical Goal(s):  Marland Kitchen Over the next 30 days, patient will verbalize understanding of plan for hypertension management . Over the next 30 days, patient will work with Consulting civil engineer to address needs related to self-management of hypertension  Interventions:  . 1:1 collaboration with Jack Perking, FNP regarding development and update of comprehensive plan of care as evidenced by provider attestation and co-signature . Inter-disciplinary care team collaboration (see longitudinal plan of care) . Evaluation of current treatment plan related to hypertension and patient's adherence to plan as established by provider. . Chart reviewed including relevant office notes and lab results . Discussed in-office blood pressure reading . Discussed blood pressure monitoring o Patient does not have a monitor and does not check blood pressure . Reviewed and discussed medications o Was able to purchase medications and is taking them regularly . Encouraged patient to obtain a blood pressure monitor and to check and record blood  pressure at least 3 times a week . Advised to call PCP with any readings outside of recommended range . Discussed diet o Tries to eat "healthy" o Reinforced DASH diet . Provided with RN Care Manager contact number and encourage to reach out as needed . Reviewed upcoming appointment with PCP for BP rck on 12/07/20  Patient Goals/Self-Care Activities Over the next  30 days, patient will: Marland Kitchen Get a blood pressure monitor . Check blood pressure 3 times a week  . Write blood pressure down in log . Take log with you to appointment with PCP Jack Cherry, [place]) . Call PCP if your blood pressure is more than 145/90      Follow Up Plan:  Telephone follow up appointment with care management team member scheduled for: 12/17/20 with RN Care Manager The patient has been provided with contact information for the care management team and has been advised to call with any health related questions or concerns.  Next PCP appointment scheduled for: 12/07/20 with Jack Smolder, FNP  Chong Sicilian, BSN, RN-BC [place] [facility] [place] Management Direct Dial: [phone]

## 2020-12-11 LAB — URINE CULTURE

## 2020-12-12 ENCOUNTER — Other Ambulatory Visit: Payer: Self-pay | Admitting: Family Medicine

## 2020-12-12 DIAGNOSIS — N3090 Cystitis, unspecified without hematuria: Secondary | ICD-10-CM

## 2020-12-12 MED ORDER — AMOXICILLIN 875 MG PO TABS: 875.0000 mg | ORAL_TABLET | Freq: Two times a day (BID) | ORAL | 0 refills | Status: AC

## 2020-12-17 ENCOUNTER — Telehealth: Payer: Medicare Other | Admitting: *Deleted

## 2020-12-19 LAB — URINALYSIS, COMPLETE
Bilirubin, UA: NEGATIVE
Glucose, UA: NEGATIVE
Ketones, UA: NEGATIVE
Nitrite, UA: NEGATIVE
Protein,UA: NEGATIVE
Specific Gravity, UA: >1.03 — ABNORMAL HIGH (ref 1.005–1.030)
Urobilinogen, Ur: 0.2 mg/dL (ref 0.2–1.0)
pH, UA: 5 (ref 5.0–7.5)

## 2020-12-19 LAB — MICROSCOPIC EXAMINATION: Epithelial Cells (non renal): NONE SEEN /hpf (ref 0–10)

## 2020-12-24 ENCOUNTER — Encounter: Payer: Self-pay | Admitting: Internal Medicine

## 2021-01-22 DIAGNOSIS — I350 Nonrheumatic aortic (valve) stenosis: Secondary | ICD-10-CM | POA: Insufficient documentation

## 2021-01-22 NOTE — Progress Notes (Deleted)
Cardiology Office Note   Date:  01/22/2021   ID:  Jack Cherry, DOB 11-02-1956, MRN [id number]  PCP:  Jack Perking, FNP  Cardiologist:   No primary care provider on file. Referring:  ***  No chief complaint on file.     History of Present Illness: Jack Cherry is a 65 y.o. male who was referred by *** for evaluation of a murmur.   ***    Echo in 2018 demonstrated mild LVH with an EF of 55 - 60%.  There was mild stenosis of the aortic valve.    Past Medical History:  Diagnosis Date  . 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 Diabetes ([place])   . High cholesterol   . Hypertension     Past Surgical History:  Procedure Laterality Date  . COLONOSCOPY N/A 01/08/2018   Dr. Oneida Alar three 3-8 mm pol;yps, simple adenomas. internal hemorrhoids. next colonoscopy in 3 years  . ESOPHAGOGASTRODUODENOSCOPY N/A 01/08/2018   Dr. Oneida Alar: normal esophagus, mild gastritis/duodenitis, H.pylori  . GIVENS CAPSULE STUDY N/A 12/21/2018   Procedure: GIVENS CAPSULE STUDY;  Surgeon: Danie Binder, MD;  [facility];  Service: Endoscopy;  Laterality: N/A;  7:30am  . HERNIA REPAIR       Current Outpatient Medications  Medication Sig Dispense Refill  . amLODipine (NORVASC) 10 MG tablet Take 1 tablet (10 mg total) by mouth daily. for blood pressure 90 tablet 1  . atorvastatin (LIPITOR) 20 MG tablet Take 1 tablet (20 mg total) by mouth daily. 90 tablet 1  . fluticasone (FLONASE) 50 MCG/ACT nasal spray Place 2 sprays into both nostrils daily. 16 g 6  . hydrochlorothiazide (HYDRODIURIL) 25 MG tablet Take 1 tablet (25 mg total) by mouth daily. 90 tablet 1  . metFORMIN (GLUCOPHAGE XR) 500 MG 24 hr tablet Take 1 tablet (500 mg total) by mouth daily with breakfast. 90 tablet 1   No current facility-administered medications for this visit.    Allergies:   Patient has no known allergies.    Social History:  The patient  reports that he has never  smoked. He has never used smokeless tobacco. He reports previous drug use. Drug: Marijuana. He reports that he does not drink alcohol.   Family History:  The patient's ***family history includes Cancer in his mother.    ROS:  Please see the history of present illness.   Otherwise, review of systems are positive for {NONE DEFAULTED:18576::"none"}.   All other systems are reviewed and negative.    PHYSICAL EXAM: VS:  There were no vitals taken for this visit. , BMI There is no height or weight on file to calculate BMI. GENERAL:  Well appearing HEENT:  Pupils equal round and reactive, fundi not visualized, oral mucosa unremarkable NECK:  No jugular venous distention, waveform within normal limits, carotid upstroke brisk and symmetric, no bruits, no thyromegaly LYMPHATICS:  No cervical, inguinal adenopathy LUNGS:  Clear to auscultation bilaterally BACK:  No CVA tenderness CHEST:  Unremarkable HEART:  PMI not displaced or sustained,S1 and S2 within normal limits, no S3, no S4, no clicks, no rubs, *** murmurs ABD:  Flat, positive bowel sounds normal in frequency in pitch, no bruits, no rebound, no guarding, no midline pulsatile mass, no hepatomegaly, no splenomegaly EXT:  2 plus pulses throughout, no edema, no cyanosis no clubbing SKIN:  No rashes no nodules NEURO:  Cranial nerves II through XII grossly intact, motor grossly intact  throughout [facility]:  Cognitively intact, oriented to person place and time    EKG:  EKG {ACTION; IS/IS VQM:08676195} ordered today. The ekg ordered today demonstrates ***   Recent Labs: 11/23/2020: ALT 30; BUN 14; Creatinine, Ser 1.08; Hemoglobin 13.5; Platelets 227; Potassium 4.3; Sodium 140; TSH 1.870    Lipid Panel    Component Value Date/Time   CHOL 230 (H) 11/23/2020 1003   TRIG 171 (H) 11/23/2020 1003   HDL 40 11/23/2020 1003   CHOLHDL 5.8 (H) 11/23/2020 1003   CHOLHDL 4.5 02/14/2020 1059   VLDL 27 02/14/2020 1059   LDLCALC 159 (H) 11/23/2020 1003       Wt Readings from Last 3 Encounters:  12/07/20 261 lb 8 oz (118.6 kg)  11/23/20 269 lb 6 oz (122.2 kg)  02/28/20 260 lb (117.9 kg)      Other studies Reviewed: Additional studies/ records that were reviewed today include: ***. Review of the above records demonstrates:  Please see elsewhere in the note.  ***   ASSESSMENT AND PLAN:  AORTIC STENOSIS:  ***   Current medicines are reviewed at length with the patient today.  The patient {ACTIONS; HAS/DOES NOT HAVE:19233} concerns regarding medicines.  The following changes have been made:  {PLAN; NO CHANGE:13088:s}  Labs/ tests ordered today include: *** No orders of the defined types were placed in this encounter.    Disposition:   FU with ***    Signed, Minus Breeding, MD  01/22/2021 7:53 PM    [facility]

## 2021-01-23 ENCOUNTER — Ambulatory Visit: Payer: Self-pay | Admitting: Cardiology

## 2021-01-23 DIAGNOSIS — I350 Nonrheumatic aortic (valve) stenosis: Secondary | ICD-10-CM

## 2021-03-08 ENCOUNTER — Other Ambulatory Visit: Payer: Self-pay

## 2021-03-08 ENCOUNTER — Encounter: Payer: Self-pay | Admitting: Family Medicine

## 2021-03-08 ENCOUNTER — Ambulatory Visit (INDEPENDENT_AMBULATORY_CARE_PROVIDER_SITE_OTHER): Payer: Medicare Other | Admitting: Family Medicine

## 2021-03-08 VITALS — BP 120/73 | HR 88 | Temp 98.3°F | Ht 73.0 in | Wt 259.0 lb

## 2021-03-08 DIAGNOSIS — E782 Mixed hyperlipidemia: Secondary | ICD-10-CM

## 2021-03-08 DIAGNOSIS — I10 Essential (primary) hypertension: Secondary | ICD-10-CM | POA: Diagnosis not present

## 2021-03-08 DIAGNOSIS — Z6835 Body mass index (BMI) 35.0-35.9, adult: Secondary | ICD-10-CM

## 2021-03-08 DIAGNOSIS — E119 Type 2 diabetes mellitus without complications: Secondary | ICD-10-CM | POA: Diagnosis not present

## 2021-03-08 LAB — BAYER DCA HB A1C WAIVED: HB A1C (BAYER DCA - WAIVED): 7.3 % — ABNORMAL HIGH (ref <7.0)

## 2021-03-08 MED ORDER — METFORMIN HCL ER 500 MG PO TB24: 1000.0000 mg | ORAL_TABLET | Freq: Every day | ORAL | 1 refills | Status: DC

## 2021-03-08 NOTE — Progress Notes (Signed)
Established Patient Office Visit  Subjective:  Patient ID: Jack Cherry, male    DOB: May 08, 1956  Age: 65 y.o. MRN: [id number]  CC:  Chief Complaint  Patient presents with  . Medical Management of Chronic Issues    HPI Jack Cherry presents for chronic follow up.  1. DM Current symptoms include none. Patient denies foot ulcerations, paresthesia of the feet, visual disturbances, vomiting and weight loss.  Current diabetic medications include metformin 500 mg daily Compliant with meds - Yes  Current monitoring regimen: none Any episodes of hypoglycemia? no  Eye exam current (within one year): no, needs to schedule Weight trend: stable  PNA Vaccine UTD?  Yes Urine microalbumin UTD? Yes  Is He on ACE inhibitor or angiotensin II receptor blocker?  No, micro albumin UTD Is He on statin? Yes lipitor Is He on ASA 81 mg daily?  No  2. HTN Complaint with meds - Yes Current Medications - norvasc, HCTZ  Pertinent ROS:  Headache - No Fatigue - No Visual Disturbances - No Chest pain - No Dyspnea - No Palpitations - No LE edema - No They report good compliance with medications and can restate their regimen by memory. No medication side effects.  Family, social, and smoking history reviewed.   BP Readings from Last 3 Encounters:  03/08/21 120/73  12/07/20 122/73  11/23/20 (!) 174/90   CMP Latest Ref Rng & Units 11/23/2020 02/14/2020 11/30/2019  Glucose 65 - 99 mg/dL 133(H) 106(H) 121(H)  BUN 8 - 27 mg/dL 14 15 51(H)  Creatinine 0.76 - 1.27 mg/dL 1.08 0.95 1.18  Sodium 134 - 144 mmol/L 140 141 139  Potassium 3.5 - 5.2 mmol/L 4.3 3.7 4.1  Chloride 96 - 106 mmol/L 103 107 105  CO2 20 - 29 mmol/L $RemoveB'21 26 25  'QwxYxOPO$ Calcium 8.6 - 10.2 mg/dL 10.0 9.1 8.9  Total Protein 6.0 - 8.5 g/dL 7.3 7.1 6.8  Total Bilirubin 0.0 - 1.2 mg/dL 0.3 0.6 0.5  Alkaline Phos 44 - 121 IU/L 75 60 40  AST 0 - 40 IU/L 27 27 49(H)  ALT 0 - 44 IU/L 30 29 33      Past Medical History:  Diagnosis Date   . 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 Diabetes ([place])   . High cholesterol   . Hypertension     Past Surgical History:  Procedure Laterality Date  . COLONOSCOPY N/A 01/08/2018   Dr. Oneida Alar three 3-8 mm pol;yps, simple adenomas. internal hemorrhoids. next colonoscopy in 3 years  . ESOPHAGOGASTRODUODENOSCOPY N/A 01/08/2018   Dr. Oneida Alar: normal esophagus, mild gastritis/duodenitis, H.pylori  . GIVENS CAPSULE STUDY N/A 12/21/2018   Procedure: GIVENS CAPSULE STUDY;  Surgeon: Danie Binder, MD;  [facility];  Service: Endoscopy;  Laterality: N/A;  7:30am  . HERNIA REPAIR      Family History  Problem Relation Age of Onset  . Cancer Mother        not sure what kind    Social History   Socioeconomic History  . Marital status: Single    Spouse name: Not on file  . Number of children: Not on file  . Years of education: 66  . Highest education level: 9th grade  Occupational History  . Not on file  Tobacco Use  . Smoking status: Never Smoker  . Smokeless tobacco: Never Used  Vaping Use  . Vaping Use: Never used  Substance and Sexual Activity  . Alcohol  use: No  . Drug use: Not Currently    Types: Marijuana  . Sexual activity: Never  Other Topics Concern  . Not on file  Social History Narrative  . Not on file   Social Determinants of Health   Financial Resource Strain: Not on file  Food Insecurity: No Food Insecurity  . Worried About Charity fundraiser in the Last Year: Never true  . Ran Out of Food in the Last Year: Never true  Transportation Needs: No Transportation Needs  . Lack of Transportation (Medical): No  . Lack of Transportation (Non-Medical): No  Physical Activity: Not on file  Stress: Not on file  Social Connections: Moderately Isolated  . Frequency of Communication with Friends and Family: More than three times a week  . Frequency of Social Gatherings with Friends and Family: Twice a week  .  Attends Religious Services: 1 to 4 times per year  . Active Member of Clubs or Organizations: No  . Attends Archivist Meetings: Never  . Marital Status: Never married  Intimate Partner Violence: Not on file    Outpatient Medications Prior to Visit  Medication Sig Dispense Refill  . amLODipine (NORVASC) 10 MG tablet Take 1 tablet (10 mg total) by mouth daily. for blood pressure 90 tablet 1  . atorvastatin (LIPITOR) 20 MG tablet Take 1 tablet (20 mg total) by mouth daily. 90 tablet 1  . hydrochlorothiazide (HYDRODIURIL) 25 MG tablet Take 1 tablet (25 mg total) by mouth daily. 90 tablet 1  . metFORMIN (GLUCOPHAGE XR) 500 MG 24 hr tablet Take 1 tablet (500 mg total) by mouth daily with breakfast. 90 tablet 1  . fluticasone (FLONASE) 50 MCG/ACT nasal spray Place 2 sprays into both nostrils daily. (Patient not taking: Reported on 03/08/2021) 16 g 6   No facility-administered medications prior to visit.    No Known Allergies  ROS Review of Systems Negative unless specially indicated above in HPI.   Objective:    Physical Exam Vitals and nursing note reviewed.  Constitutional:      Appearance: Normal appearance.  Neck:     Vascular: No carotid bruit.  Cardiovascular:     Rate and Rhythm: Normal rate and regular rhythm.     Heart sounds: Normal heart sounds. No murmur heard.   Pulmonary:     Effort: Pulmonary effort is normal. No respiratory distress.     Breath sounds: Normal breath sounds.  Abdominal:     General: Bowel sounds are normal.     Palpations: Abdomen is soft.  Musculoskeletal:     Cervical back: Neck supple. No tenderness.     Right lower leg: No edema.     Left lower leg: No edema.  Skin:    General: Skin is warm and dry.  Neurological:     General: No focal deficit present.     Mental Status: He is alert and oriented to person, place, and time.  Psychiatric:        Mood and Affect: Mood normal.        Behavior: Behavior normal.     BP 120/73    Pulse 88   Temp 98.3 F (36.8 C) (Temporal)   Ht $R'6\' 1"'DW$  (1.854 m)   Wt 259 lb (117.5 kg)   BMI 34.17 kg/m  Wt Readings from Last 3 Encounters:  03/08/21 259 lb (117.5 kg)  12/07/20 261 lb 8 oz (118.6 kg)  11/23/20 269 lb 6 oz (122.2 kg)  Health Maintenance Due  Topic Date Due  . OPHTHALMOLOGY EXAM  01/19/2019  . COLON CANCER SCREENING ANNUAL FOBT  12/07/2019    There are no preventive care reminders to display for this patient.  Lab Results  Component Value Date   TSH 1.870 11/23/2020   Lab Results  Component Value Date   WBC 3.7 11/23/2020   HGB 13.5 11/23/2020   HCT 39.7 11/23/2020   MCV 91 11/23/2020   PLT 227 11/23/2020   Lab Results  Component Value Date   NA 140 11/23/2020   K 4.3 11/23/2020   CO2 21 11/23/2020   GLUCOSE 133 (H) 11/23/2020   BUN 14 11/23/2020   CREATININE 1.08 11/23/2020   BILITOT 0.3 11/23/2020   ALKPHOS 75 11/23/2020   AST 27 11/23/2020   ALT 30 11/23/2020   PROT 7.3 11/23/2020   ALBUMIN 4.5 11/23/2020   CALCIUM 10.0 11/23/2020   ANIONGAP 8 02/14/2020   Lab Results  Component Value Date   CHOL 230 (H) 11/23/2020   Lab Results  Component Value Date   HDL 40 11/23/2020   Lab Results  Component Value Date   LDLCALC 159 (H) 11/23/2020   Lab Results  Component Value Date   TRIG 171 (H) 11/23/2020   Lab Results  Component Value Date   CHOLHDL 5.8 (H) 11/23/2020   Lab Results  Component Value Date   HGBA1C 7.0 (H) 11/23/2020      Assessment & Plan:   Ashraf was seen today for medical management of chronic issues.  Diagnoses and all orders for this visit:  Type 2 diabetes mellitus without complication, without long-term current use of insulin (HCC) Not at goal. A1c 7.3 today. Increased metformin to 1000 mg daily. Labs pending as below. Reminded to schedule eye exam. Declined Tdap today.  -     CBC with Differential/Platelet -     CMP14+EGFR -     Lipid panel -     Bayer DCA Hb A1c Waived -     metFORMIN  (GLUCOPHAGE XR) 500 MG 24 hr tablet; Take 2 tablets (1,000 mg total) by mouth daily with breakfast.  Primary hypertension Well controlled on current regimen. On Norvasc and HCTZ.  -     CBC with Differential/Platelet -     CMP14+EGFR -     Lipid panel  Class 2 severe obesity due to excess calories with serious comorbidity and body mass index (BMI) of 35.0 to 35.9 in adult [facility]) Diet and exercise. Labs pending.  -     CBC with Differential/Platelet -     CMP14+EGFR -     Lipid panel -     Bayer DCA Hb A1c Waived  Mixed hyperlipidemia On lipitor. Labs pending.  -     CMP14+EGFR -     Lipid panel   Follow-up: Return in about 3 months (around 06/08/2021) for chronic follow up.   The patient indicates understanding of these issues and agrees with the plan.    Gwenlyn Perking, FNP

## 2021-03-08 NOTE — Patient Instructions (Signed)
Needs diabetic eye exam. Please send records to [facility].

## 2021-03-09 LAB — CBC WITH DIFFERENTIAL/PLATELET
Basophils Absolute: 0 10*3/uL (ref 0.0–0.2)
Basos: 1 %
EOS (ABSOLUTE): 0.1 10*3/uL (ref 0.0–0.4)
Eos: 1 %
Hematocrit: 39.2 % (ref 37.5–51.0)
Hemoglobin: 12.9 g/dL — ABNORMAL LOW (ref 13.0–17.7)
Immature Grans (Abs): 0 10*3/uL (ref 0.0–0.1)
Immature Granulocytes: 1 %
Lymphocytes Absolute: 1.5 10*3/uL (ref 0.7–3.1)
Lymphs: 36 %
MCH: 30.4 pg (ref 26.6–33.0)
MCHC: 32.9 g/dL (ref 31.5–35.7)
MCV: 93 fL (ref 79–97)
Monocytes Absolute: 0.4 10*3/uL (ref 0.1–0.9)
Monocytes: 9 %
Neutrophils Absolute: 2.2 10*3/uL (ref 1.4–7.0)
Neutrophils: 52 %
Platelets: 252 10*3/uL (ref 150–450)
RBC: 4.24 x10E6/uL (ref 4.14–5.80)
RDW: 14.1 % (ref 11.6–15.4)
WBC: 4.3 10*3/uL (ref 3.4–10.8)

## 2021-03-09 LAB — CMP14+EGFR
ALT: 22 IU/L (ref 0–44)
AST: 25 IU/L (ref 0–40)
Albumin/Globulin Ratio: 1.7 (ref 1.2–2.2)
Albumin: 4.6 g/dL (ref 3.8–4.8)
Alkaline Phosphatase: 67 IU/L (ref 44–121)
BUN/Creatinine Ratio: 25 — ABNORMAL HIGH (ref 10–24)
BUN: 27 mg/dL (ref 8–27)
Bilirubin Total: 0.4 mg/dL (ref 0.0–1.2)
CO2: 20 mmol/L (ref 20–29)
Calcium: 10.4 mg/dL — ABNORMAL HIGH (ref 8.6–10.2)
Chloride: 101 mmol/L (ref 96–106)
Creatinine, Ser: 1.09 mg/dL (ref 0.76–1.27)
Globulin, Total: 2.7 g/dL (ref 1.5–4.5)
Glucose: 144 mg/dL — ABNORMAL HIGH (ref 65–99)
Potassium: 4 mmol/L (ref 3.5–5.2)
Sodium: 141 mmol/L (ref 134–144)
Total Protein: 7.3 g/dL (ref 6.0–8.5)
eGFR: 75 mL/min/{1.73_m2} (ref >59)

## 2021-03-09 LAB — LIPID PANEL
Chol/HDL Ratio: 3.9 ratio (ref 0.0–5.0)
Cholesterol, Total: 148 mg/dL (ref 100–199)
HDL: 38 mg/dL — ABNORMAL LOW (ref >39)
LDL Chol Calc (NIH): 76 mg/dL (ref 0–99)
Triglycerides: 200 mg/dL — ABNORMAL HIGH (ref 0–149)
VLDL Cholesterol Cal: 34 mg/dL (ref 5–40)

## 2021-03-12 ENCOUNTER — Other Ambulatory Visit: Payer: Self-pay | Admitting: Family Medicine

## 2021-03-12 DIAGNOSIS — E785 Hyperlipidemia, unspecified: Secondary | ICD-10-CM

## 2021-03-12 MED ORDER — ATORVASTATIN CALCIUM 40 MG PO TABS: 40.0000 mg | ORAL_TABLET | Freq: Every day | ORAL | 3 refills | Status: DC

## 2021-04-09 NOTE — Progress Notes (Signed)
Cardiology Office Note   Date:  04/10/2021   ID:  Jack Cherry, DOB 09-15-1956, MRN [id number]  PCP:  Gwenlyn Perking, FNP  Cardiologist:   None Referring:  Gwenlyn Perking, FNP  Chief Complaint  Patient presents with  . Nocturia      History of Present Illness: Jack Cherry is a 65 y.o. male who was referred by Gwenlyn Perking, FNP for evaluation of a murmur.  He has had a murmur apparently for years I did see an echo from 2018.  He had some mild LVH with normal left ventricular function and a thickened aortic valve with mild stenosis.  It sounds however like he does fine.  He does not report any shortness of breath, PND or orthopnea.  He denies any chest pressure, neck or arm discomfort.  He has no palpitations, presyncope or syncope.  He does all of his yard work.  His biggest issue is frequent urination.   Past Medical History:  Diagnosis Date  . 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 Diabetes ([place])   . High cholesterol   . Hypertension     Past Surgical History:  Procedure Laterality Date  . COLONOSCOPY N/A 01/08/2018   Dr. Oneida Alar three 3-8 mm pol;yps, simple adenomas. internal hemorrhoids. next colonoscopy in 3 years  . ESOPHAGOGASTRODUODENOSCOPY N/A 01/08/2018   Dr. Oneida Alar: normal esophagus, mild gastritis/duodenitis, H.pylori  . GIVENS CAPSULE STUDY N/A 12/21/2018   Procedure: GIVENS CAPSULE STUDY;  Surgeon: Danie Binder, MD;  [facility];  Service: Endoscopy;  Laterality: N/A;  7:30am  . HERNIA REPAIR       Current Outpatient Medications  Medication Sig Dispense Refill  . amLODipine (NORVASC) 10 MG tablet Take 1 tablet (10 mg total) by mouth daily. for blood pressure 90 tablet 1  . atorvastatin (LIPITOR) 20 MG tablet Take 20 mg by mouth daily.    Marland Kitchen losartan (COZAAR) 25 MG tablet Take 1 tablet (25 mg total) by mouth daily. 90 tablet 3  . metFORMIN (GLUCOPHAGE XR) 500 MG 24 hr tablet Take 2 tablets  (1,000 mg total) by mouth daily with breakfast. 90 tablet 1   No current facility-administered medications for this visit.    Allergies:   Patient has no known allergies.    Social History:  The patient  reports that he has never smoked. He has never used smokeless tobacco. He reports previous drug use. Drug: Marijuana. He reports that he does not drink alcohol.   Family History:  The patient's family history includes Cancer in his mother; Heart disease (age of onset: 75) in his father.    ROS:  Please see the history of present illness.   Otherwise, review of systems are positive for frequent urination, cough.   All other systems are reviewed and negative.    PHYSICAL EXAM: VS:  BP 128/80   Pulse 82   Ht 6\' 1"  (1.854 m)   Wt 265 lb (120.2 kg)   BMI 34.96 kg/m  , BMI Body mass index is 34.96 kg/m. GENERAL:  Well appearing HEENT:  Pupils equal round and reactive, fundi not visualized, oral mucosa unremarkable NECK:  No jugular venous distention, waveform within normal limits, carotid upstroke brisk and symmetric, no bruits, no thyromegaly LYMPHATICS:  No cervical, inguinal adenopathy LUNGS:  Clear to auscultation bilaterally BACK:  No CVA tenderness CHEST:  Unremarkable HEART:  PMI not displaced or sustained,S1 and S2 within normal  limits, no S3, no S4, no clicks, no rubs, 3 out of 6 early peaking systolic murmur radiating out the aortic outflow tract, no diastolic murmurs ABD:  Flat, positive bowel sounds normal in frequency in pitch, no bruits, no rebound, no guarding, no midline pulsatile mass, no hepatomegaly, no splenomegaly EXT:  2 plus pulses throughout, no edema, no cyanosis no clubbing SKIN:  No rashes no nodules NEURO:  Cranial nerves II through XII grossly intact, motor grossly intact throughout PSYCH:  Cognitively intact, oriented to person place and time    EKG:  EKG is ordered today. The ekg ordered today demonstrates sinus rhythm, rate 82, axis within normal  limits, intervals within normal limits, nonspecific T wave flattening not significantly changed from previous.   Recent Labs: 11/23/2020: TSH 1.870 03/08/2021: ALT 22; BUN 27; Creatinine, Ser 1.09; Hemoglobin 12.9; Platelets 252; Potassium 4.0; Sodium 141    Lipid Panel    Component Value Date/Time   CHOL 148 03/08/2021 1053   TRIG 200 (H) 03/08/2021 1053   HDL 38 (L) 03/08/2021 1053   CHOLHDL 3.9 03/08/2021 1053   CHOLHDL 4.5 02/14/2020 1059   VLDL 27 02/14/2020 1059   LDLCALC 76 03/08/2021 1053      Wt Readings from Last 3 Encounters:  04/10/21 265 lb (120.2 kg)  03/08/21 259 lb (117.5 kg)  12/07/20 261 lb 8 oz (118.6 kg)      Other studies Reviewed: Additional studies/ records that were reviewed today include: Labs. Review of the above records demonstrates:  Please see elsewhere in the note.     ASSESSMENT AND PLAN:  MURMUR:    He has aortic stenosis mild.  I will check an echocardiogram next year as I would not suspect clinically this was severe.  We talked about this at length and I reviewed the anatomy of the hand.  No change in therapy.  DM: A1c is 7.3.  I will defer to Gwenlyn Perking, FNP  HTN:    His blood pressure is at target.  However, he has a lot of urination and this is his biggest complaint.  Therefore I am going to stop the HCTZ and instead he is Cozaar 25 mg daily to see how he does with this.    Current medicines are reviewed at length with the patient today.  The patient does not have concerns regarding medicines.  The following changes have been made: As above  Labs/ tests ordered today include:   Orders Placed This Encounter  Procedures  . EKG 12-Lead     Disposition:   FU with me in 1 year after the echo   Signed, Minus Breeding, MD  04/10/2021 3:04 PM    [facility]

## 2021-04-10 ENCOUNTER — Other Ambulatory Visit: Payer: Self-pay

## 2021-04-10 ENCOUNTER — Ambulatory Visit (INDEPENDENT_AMBULATORY_CARE_PROVIDER_SITE_OTHER): Payer: Medicare Other | Admitting: Cardiology

## 2021-04-10 ENCOUNTER — Encounter: Payer: Self-pay | Admitting: Cardiology

## 2021-04-10 VITALS — BP 128/80 | HR 82 | Ht 73.0 in | Wt 265.0 lb

## 2021-04-10 DIAGNOSIS — I10 Essential (primary) hypertension: Secondary | ICD-10-CM | POA: Diagnosis not present

## 2021-04-10 DIAGNOSIS — R011 Cardiac murmur, unspecified: Secondary | ICD-10-CM | POA: Diagnosis not present

## 2021-04-10 DIAGNOSIS — E118 Type 2 diabetes mellitus with unspecified complications: Secondary | ICD-10-CM

## 2021-04-10 MED ORDER — LOSARTAN POTASSIUM 25 MG PO TABS: 25.0000 mg | ORAL_TABLET | Freq: Every day | ORAL | 3 refills | Status: DC

## 2021-04-10 NOTE — Patient Instructions (Signed)
Medication Instructions:  Please stop your Hydrochlorothiazide. Start Cozaar (losartan) 25 mg once a day. Continue all other medications as listed.  *If you need a refill on your cardiac medications before your next appointment, please call your pharmacy*  Follow-Up: At [facility], you and your health needs are our priority.  As part of our continuing mission to provide you with exceptional heart care, we have created designated Provider Care Teams.  These Care Teams include your primary Cardiologist (physician) and Advanced Practice Providers (APPs -  Physician Assistants and Nurse Practitioners) who all work together to provide you with the care you need, when you need it.  We recommend signing up for the patient portal called "MyChart".  Sign up information is provided on this After Visit Summary.  MyChart is used to connect with patients for Virtual Visits (Telemedicine).  Patients are able to view lab/test results, encounter notes, upcoming appointments, etc.  Non-urgent messages can be sent to your provider as well.   To learn more about what you can do with MyChart, go to [email or web address].    Your next appointment:   12 month(s)  The format for your next appointment:   In Person  Provider:   Minus Breeding, MD   Thank you for choosing [facility]!!

## 2021-05-24 ENCOUNTER — Other Ambulatory Visit: Payer: Self-pay | Admitting: Family Medicine

## 2021-05-24 DIAGNOSIS — I10 Essential (primary) hypertension: Secondary | ICD-10-CM

## 2021-06-10 ENCOUNTER — Other Ambulatory Visit: Payer: Self-pay

## 2021-06-10 ENCOUNTER — Ambulatory Visit (INDEPENDENT_AMBULATORY_CARE_PROVIDER_SITE_OTHER): Payer: Medicare Other | Admitting: Family Medicine

## 2021-06-10 ENCOUNTER — Encounter: Payer: Self-pay | Admitting: Family Medicine

## 2021-06-10 VITALS — BP 157/95 | HR 81 | Temp 99.1°F | Ht 73.0 in | Wt 260.4 lb

## 2021-06-10 DIAGNOSIS — E785 Hyperlipidemia, unspecified: Secondary | ICD-10-CM

## 2021-06-10 DIAGNOSIS — E1165 Type 2 diabetes mellitus with hyperglycemia: Secondary | ICD-10-CM | POA: Diagnosis not present

## 2021-06-10 DIAGNOSIS — E1159 Type 2 diabetes mellitus with other circulatory complications: Secondary | ICD-10-CM | POA: Insufficient documentation

## 2021-06-10 DIAGNOSIS — I152 Hypertension secondary to endocrine disorders: Secondary | ICD-10-CM

## 2021-06-10 DIAGNOSIS — E1169 Type 2 diabetes mellitus with other specified complication: Secondary | ICD-10-CM

## 2021-06-10 DIAGNOSIS — E6609 Other obesity due to excess calories: Secondary | ICD-10-CM | POA: Diagnosis not present

## 2021-06-10 DIAGNOSIS — Z6834 Body mass index (BMI) 34.0-34.9, adult: Secondary | ICD-10-CM

## 2021-06-10 LAB — LIPID PANEL
Chol/HDL Ratio: 4.9 ratio (ref 0.0–5.0)
Cholesterol, Total: 199 mg/dL (ref 100–199)
HDL: 41 mg/dL (ref >39)
LDL Chol Calc (NIH): 130 mg/dL — ABNORMAL HIGH (ref 0–99)
Triglycerides: 156 mg/dL — ABNORMAL HIGH (ref 0–149)
VLDL Cholesterol Cal: 28 mg/dL (ref 5–40)

## 2021-06-10 LAB — CBC WITH DIFFERENTIAL/PLATELET
Basophils Absolute: 0.1 10*3/uL (ref 0.0–0.2)
Basos: 2 %
EOS (ABSOLUTE): 0.1 10*3/uL (ref 0.0–0.4)
Eos: 2 %
Hematocrit: 37.3 % — ABNORMAL LOW (ref 37.5–51.0)
Hemoglobin: 12.4 g/dL — ABNORMAL LOW (ref 13.0–17.7)
Immature Grans (Abs): 0 10*3/uL (ref 0.0–0.1)
Immature Granulocytes: 1 %
Lymphocytes Absolute: 1.6 10*3/uL (ref 0.7–3.1)
Lymphs: 42 %
MCH: 30.7 pg (ref 26.6–33.0)
MCHC: 33.2 g/dL (ref 31.5–35.7)
MCV: 92 fL (ref 79–97)
Monocytes Absolute: 0.4 10*3/uL (ref 0.1–0.9)
Monocytes: 9 %
Neutrophils Absolute: 1.7 10*3/uL (ref 1.4–7.0)
Neutrophils: 44 %
Platelets: 231 10*3/uL (ref 150–450)
RBC: 4.04 x10E6/uL — ABNORMAL LOW (ref 4.14–5.80)
RDW: 14.1 % (ref 11.6–15.4)
WBC: 3.9 10*3/uL (ref 3.4–10.8)

## 2021-06-10 LAB — CMP14+EGFR
ALT: 18 IU/L (ref 0–44)
AST: 19 IU/L (ref 0–40)
Albumin/Globulin Ratio: 2 (ref 1.2–2.2)
Albumin: 4.5 g/dL (ref 3.8–4.8)
Alkaline Phosphatase: 56 IU/L (ref 44–121)
BUN/Creatinine Ratio: 12 (ref 10–24)
BUN: 13 mg/dL (ref 8–27)
Bilirubin Total: 0.3 mg/dL (ref 0.0–1.2)
CO2: 20 mmol/L (ref 20–29)
Calcium: 9.5 mg/dL (ref 8.6–10.2)
Chloride: 106 mmol/L (ref 96–106)
Creatinine, Ser: 1.05 mg/dL (ref 0.76–1.27)
Globulin, Total: 2.3 g/dL (ref 1.5–4.5)
Glucose: 122 mg/dL — ABNORMAL HIGH (ref 65–99)
Potassium: 4.3 mmol/L (ref 3.5–5.2)
Sodium: 142 mmol/L (ref 134–144)
Total Protein: 6.8 g/dL (ref 6.0–8.5)
eGFR: 79 mL/min/{1.73_m2} (ref >59)

## 2021-06-10 LAB — BAYER DCA HB A1C WAIVED: HB A1C (BAYER DCA - WAIVED): 6.7 % (ref <7.0)

## 2021-06-10 MED ORDER — ATORVASTATIN CALCIUM 20 MG PO TABS: 20.0000 mg | ORAL_TABLET | Freq: Every day | ORAL | 3 refills | Status: DC

## 2021-06-10 MED ORDER — METFORMIN HCL ER 500 MG PO TB24: 1000.0000 mg | ORAL_TABLET | Freq: Every day | ORAL | 3 refills | Status: DC

## 2021-06-10 MED ORDER — AMLODIPINE BESYLATE 10 MG PO TABS: 10.0000 mg | ORAL_TABLET | Freq: Every day | ORAL | 3 refills | Status: DC

## 2021-06-10 NOTE — Progress Notes (Signed)
Established Patient Office Visit  Subjective:  Patient ID: Jack Cherry, male    DOB: [birth year]  Age: 65 y.o. MRN: [id number]  CC:  Chief Complaint  Patient presents with   Hyperlipidemia   Diabetes   Medical Management of Chronic Issues   Hypertension    HPI Jack Cherry presents for chronic follow up.  1. DM Current symptoms include none. Patient denies foot ulcerations, paresthesia of the feet, visual disturbances, vomiting and weight loss.  Current diabetic medications include metformin 500 mg daily Compliant with meds - Yes  Current monitoring regimen: none Any episodes of hypoglycemia? no  Eye exam current (within one year):  no, needs to schedule Weight trend: stable, has lost 5 pounds since last visit  PNA Vaccine UTD?  Yes Urine microalbumin UTD? Yes  Is He on ACE inhibitor or angiotensin II receptor blocker?  No, micro albumin UTD Is He on statin? Yes lipitor Is He on ASA 81 mg daily?  No  2. HTN Complaint with meds - Yes Current Medications - norvasc, losartan. Reports he has been out of his medication for 1 week.  Pertinent ROS:  Headache - No Fatigue - No Visual Disturbances - No Chest pain - No Dyspnea - No Palpitations - No LE edema - No They report good compliance with medications and can restate their regimen by memory. No medication side effects.  Family, social, and smoking history reviewed.   BP Readings from Last 3 Encounters:  06/10/21 (!) 157/95  04/10/21 128/80  03/08/21 120/73   CMP Latest Ref Rng & Units 03/08/2021 11/23/2020 02/14/2020  Glucose 65 - 99 mg/dL 144(H) 133(H) 106(H)  BUN 8 - 27 mg/dL $Remove'27 14 15  'hQHCJdk$ Creatinine 0.76 - 1.27 mg/dL 1.09 1.08 0.95  Sodium 134 - 144 mmol/L 141 140 141  Potassium 3.5 - 5.2 mmol/L 4.0 4.3 3.7  Chloride 96 - 106 mmol/L 101 103 107  CO2 20 - 29 mmol/L $RemoveB'20 21 26  'DJYhfOKl$ Calcium 8.6 - 10.2 mg/dL 10.4(H) 10.0 9.1  Total Protein 6.0 - 8.5 g/dL 7.3 7.3 7.1  Total Bilirubin 0.0 - 1.2 mg/dL 0.4 0.3 0.6   Alkaline Phos 44 - 121 IU/L 67 75 60  AST 0 - 40 IU/L $Remov'25 27 27  'YvdRyK$ ALT 0 - 44 IU/L $Remov'22 30 29      'WZSgXj$ Past Medical History:  Diagnosis Date   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 Oneida Alar three 3-8 mm pol;yps, simple adenomas. internal hemorrhoids. next colonoscopy in 3 years   ESOPHAGOGASTRODUODENOSCOPY N/A 01/08/2018   Dr. Oneida Alar: normal esophagus, mild gastritis/duodenitis, H.pylori   GIVENS CAPSULE STUDY N/A 12/21/2018   Procedure: GIVENS CAPSULE STUDY;  Surgeon: Danie Binder, MD;  [facility];  Service: Endoscopy;  Laterality: N/A;  7:30am   HERNIA REPAIR      Family History  Problem Relation Age of Onset   Cancer Mother        not sure what kind   Heart disease Father 55    Social History   Socioeconomic History   Marital status: Single    Spouse name: Not on file   Number of children: Not on file   Years of education: 9   Highest education level: 9th grade  Occupational History   Not  on file  Tobacco Use   Smoking status: Never   Smokeless tobacco: Never  Vaping Use   Vaping Use: Never used  Substance and Sexual Activity   Alcohol use: No   Drug use: Not Currently    Types: Marijuana   Sexual activity: Never  Other Topics Concern   Not on file  Social History Narrative   Lives with nephew   Social Determinants of Health   Financial Resource Strain: Not on file  Food Insecurity: No Food Insecurity   Worried About Charity fundraiser in the Last Year: Never true   [place] in the Last Year: Never true  Transportation Needs: No Transportation Needs   Lack of Transportation (Medical): No   Lack of Transportation (Non-Medical): No  Physical Activity: Not on file  Stress: Not on file  Social Connections: Moderately Isolated   Frequency of Communication  with Friends and Family: More than three times a week   Frequency of Social Gatherings with Friends and Family: Twice a week   Attends Religious Services: 1 to 4 times per year   Active Member of [employer] or Organizations: No   Attends Archivist Meetings: Never   Marital Status: Never married  Human resources officer Violence: Not on file    Outpatient Medications Prior to Visit  Medication Sig Dispense Refill   amLODipine (NORVASC) 10 MG tablet Take 1 tablet (10 mg total) by mouth daily. for blood pressure 90 tablet 1   atorvastatin (LIPITOR) 20 MG tablet Take 20 mg by mouth daily.     losartan (COZAAR) 25 MG tablet Take 1 tablet (25 mg total) by mouth daily. 90 tablet 3   metFORMIN (GLUCOPHAGE XR) 500 MG 24 hr tablet Take 2 tablets (1,000 mg total) by mouth daily with breakfast. 90 tablet 1   No facility-administered medications prior to visit.    No Known Allergies  ROS Review of Systems Negative unless specially indicated above in HPI.   Objective:    Physical Exam Vitals and nursing note reviewed.  Constitutional:      General: He is not in acute distress.    Appearance: He is obese. He is not ill-appearing, toxic-appearing or diaphoretic.  Neck:     Vascular: No carotid bruit.  Cardiovascular:     Rate and Rhythm: Normal rate and regular rhythm.     Heart sounds: Murmur heard.  Pulmonary:     Effort: Pulmonary effort is normal. No respiratory distress.     Breath sounds: Normal breath sounds.  Abdominal:     General: Bowel sounds are normal.     Palpations: Abdomen is soft.  Musculoskeletal:     Cervical back: Neck supple. No tenderness.     Right lower leg: No edema.     Left lower leg: No edema.  Skin:    General: Skin is warm and dry.  Neurological:     General: No focal deficit present.     Mental Status: He is alert and oriented to person, place, and time.  Psychiatric:        Mood and Affect: Mood normal.        Behavior: Behavior normal.    BP  (!) 157/95   Pulse 81   Temp 99.1 F (37.3 C) (Oral)   Ht $R'6\' 1"'ip$  (1.854 m)   Wt 260 lb 6 oz (118.1 kg)   BMI 34.35 kg/m  Wt Readings from Last 3 Encounters:  06/10/21 260 lb 6 oz (118.1  kg)  04/10/21 265 lb (120.2 kg)  03/08/21 259 lb (117.5 kg)     Health Maintenance Due  Topic Date Due   INFLUENZA VACCINE  06/03/2021    There are no preventive care reminders to display for this patient.  Lab Results  Component Value Date   TSH 1.870 11/23/2020   Lab Results  Component Value Date   WBC 4.3 03/08/2021   HGB 12.9 (L) 03/08/2021   HCT 39.2 03/08/2021   MCV 93 03/08/2021   PLT 252 03/08/2021   Lab Results  Component Value Date   NA 141 03/08/2021   K 4.0 03/08/2021   CO2 20 03/08/2021   GLUCOSE 144 (H) 03/08/2021   BUN 27 03/08/2021   CREATININE 1.09 03/08/2021   BILITOT 0.4 03/08/2021   ALKPHOS 67 03/08/2021   AST 25 03/08/2021   ALT 22 03/08/2021   PROT 7.3 03/08/2021   ALBUMIN 4.6 03/08/2021   CALCIUM 10.4 (H) 03/08/2021   ANIONGAP 8 02/14/2020   EGFR 75 03/08/2021   Lab Results  Component Value Date   CHOL 148 03/08/2021   Lab Results  Component Value Date   HDL 38 (L) 03/08/2021   Lab Results  Component Value Date   LDLCALC 76 03/08/2021   Lab Results  Component Value Date   TRIG 200 (H) 03/08/2021   Lab Results  Component Value Date   CHOLHDL 3.9 03/08/2021   Lab Results  Component Value Date   HGBA1C 7.3 (H) 03/08/2021      Assessment & Plan:   [place] was seen today for hyperlipidemia, diabetes, medical management of chronic issues and hypertension.  Diagnoses and all orders for this visit:  Type 2 diabetes mellitus with hyperglycemia, without long-term current use of insulin (HCC) A1c at goal today at 6.7. Continue current regimen. Labs pending. Reminded to schedule eye exam.  -     CBC with Differential/Platelet -     CMP14+EGFR -     Bayer DCA Hb A1c Waived -     atorvastatin (LIPITOR) 20 MG tablet; Take 1 tablet (20 mg  total) by mouth daily. -     metFORMIN (GLUCOPHAGE XR) 500 MG 24 hr tablet; Take 2 tablets (1,000 mg total) by mouth daily with breakfast.  Hyperlipidemia associated with type 2 diabetes mellitus ([place]) On statin.Labs pending. Diet and exercise.  -     Lipid panel -     atorvastatin (LIPITOR) 20 MG tablet; Take 1 tablet (20 mg total) by mouth daily.  Hypertension associated with diabetes ([place]) Elevated today, has been out of medications for 1 week. Refills provided. Previously well controlled. Asymptomatic.  -     amLODipine (NORVASC) 10 MG tablet; Take 1 tablet (10 mg total) by mouth daily. for blood pressure  Class 1 obesity due to excess calories with serious comorbidity and body mass index (BMI) of 34.0 to 34.9 in adult Diet and exercise. Has lost 5 lbs since last visit.    Follow-up: Return in about 3 months (around 09/10/2021) for chronic follow up.   The patient indicates understanding of these issues and agrees with the plan.    Gwenlyn Perking, FNP

## 2021-07-10 ENCOUNTER — Encounter: Payer: Self-pay | Admitting: Family Medicine

## 2021-07-10 ENCOUNTER — Ambulatory Visit (INDEPENDENT_AMBULATORY_CARE_PROVIDER_SITE_OTHER): Payer: Medicare Other | Admitting: Family Medicine

## 2021-07-10 ENCOUNTER — Other Ambulatory Visit: Payer: Self-pay

## 2021-07-10 VITALS — BP 142/86 | HR 40 | Temp 97.8°F | Ht 73.0 in | Wt 259.2 lb

## 2021-07-10 DIAGNOSIS — I491 Atrial premature depolarization: Secondary | ICD-10-CM

## 2021-07-10 DIAGNOSIS — E1159 Type 2 diabetes mellitus with other circulatory complications: Secondary | ICD-10-CM

## 2021-07-10 DIAGNOSIS — I152 Hypertension secondary to endocrine disorders: Secondary | ICD-10-CM

## 2021-07-10 DIAGNOSIS — Z0000 Encounter for general adult medical examination without abnormal findings: Secondary | ICD-10-CM

## 2021-07-10 MED ORDER — AMLODIPINE BESYLATE 5 MG PO TABS: 5.0000 mg | ORAL_TABLET | Freq: Every day | ORAL | 3 refills | Status: DC

## 2021-07-10 MED ORDER — LOSARTAN POTASSIUM 50 MG PO TABS: 50.0000 mg | ORAL_TABLET | Freq: Every day | ORAL | 3 refills | Status: DC

## 2021-07-10 NOTE — Progress Notes (Signed)
Subjective:   Jack Cherry is a 65 y.o. male who presents for a Welcome to Medicare exam.   Review of Systems: Review of Systems  Constitutional:  Negative for diaphoresis, malaise/fatigue and weight loss.  Eyes:  Negative for blurred vision, double vision and photophobia.  Respiratory:  Negative for shortness of breath and wheezing.   Cardiovascular:  Negative for chest pain, palpitations, claudication and leg swelling.  Gastrointestinal:  Negative for nausea and vomiting.  Neurological:  Negative for dizziness, tingling, focal weakness, loss of consciousness, weakness and headaches.   Cardiac Risk Factors include: advanced age (>54mn, >>63women);diabetes mellitus;hypertension;dyslipidemia;male gender;obesity (BMI >30kg/m2)     Objective:    Today's Vitals   07/10/21 1000 07/10/21 1100  BP: (!) 170/98 (!) 142/86  Pulse: (!) 40   Temp: 97.8 F (36.6 C)   TempSrc: Temporal   Weight: 259 lb 4 oz (117.6 kg)   Height: '6\' 1"'$  (1.854 m)    Body mass index is 34.2 kg/m.  Medications Outpatient Encounter Medications as of 07/10/2021  Medication Sig   amLODipine (NORVASC) 10 MG tablet Take 1 tablet (10 mg total) by mouth daily. for blood pressure   atorvastatin (LIPITOR) 20 MG tablet Take 1 tablet (20 mg total) by mouth daily.   losartan (COZAAR) 25 MG tablet Take 1 tablet (25 mg total) by mouth daily.   metFORMIN (GLUCOPHAGE XR) 500 MG 24 hr tablet Take 2 tablets (1,000 mg total) by mouth daily with breakfast.   No facility-administered encounter medications on file as of 07/10/2021.     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 FOneida Alarthree 3-8 mm pol;yps, simple adenomas. internal hemorrhoids. next colonoscopy in 3 years    ESOPHAGOGASTRODUODENOSCOPY N/A 01/08/2018   Dr. FOneida Alar normal esophagus, mild gastritis/duodenitis, H.pylori   GIVENS CAPSULE STUDY N/A 12/21/2018   Procedure: GIVENS CAPSULE STUDY;  Surgeon: FDanie Binder MD;  [facility];  Service: Endoscopy;  Laterality: N/A;  7:30am   HERNIA REPAIR      Family History  Problem Relation Age of Onset   Cancer Mother        not sure what kind   Heart disease Father 829  Social History   Occupational History   Not on file  Tobacco Use   Smoking status: Never   Smokeless tobacco: Never  Vaping Use   Vaping Use: Never used  Substance and Sexual Activity   Alcohol use: No   Drug use: Not Currently    Types: Marijuana    Comment: last use of Marijuana 20 years ago   Sexual activity: Not Currently   Tobacco Counseling Counseling given: No   Immunizations and Health Maintenance Immunization History  Administered Date(s) Administered   Fluad Quad(high Dose 65+) 11/23/2020   Influenza Split 08/07/2015, 08/06/2016   Influenza,inj,Quad PF,6+ Mos 08/06/2017, 09/04/2018   Influenza-Unspecified 08/16/2019   Moderna Sars-Covid-2 Vaccination 02/14/2020, 03/13/2020   Pneumococcal Conjugate-13 11/23/2020   Health Maintenance Due  Topic Date Due   OPHTHALMOLOGY EXAM  01/19/2019    Activities of Daily Living In your present state of health, do you have any difficulty performing the following activities: 07/10/2021  Hearing? N  Vision? N  Difficulty concentrating or making decisions?  N  Walking or climbing stairs? N  Dressing or bathing? N  Doing errands, shopping? N  Preparing Food and eating ? N  Using the Toilet? N  In the past six months, have you accidently leaked urine? N  Do you have problems with loss of bowel control? N  Managing your Medications? N  Managing your Finances? N  Housekeeping or managing your Housekeeping? N  Some recent data might be hidden    Physical Exam  Physical Exam Vitals and nursing note  reviewed.  Constitutional:      General: He is not in acute distress.    Appearance: He is not ill-appearing, toxic-appearing or diaphoretic.  Neck:     Vascular: No carotid bruit or JVD.  Cardiovascular:     Rate and Rhythm: Normal rate. Rhythm irregular.     Heart sounds: Murmur heard.  Systolic murmur is present with a grade of 2/6.    No gallop. No S3 or S4 sounds.  Pulmonary:     Effort: Pulmonary effort is normal. No respiratory distress.     Breath sounds: Normal breath sounds. No wheezing.  Musculoskeletal:     Cervical back: Neck supple.     Right lower leg: No edema.     Left lower leg: No edema.  Skin:    General: Skin is warm and dry.  Neurological:     General: No focal deficit present.     Mental Status: He is alert and oriented to person, place, and time.  Psychiatric:        Mood and Affect: Mood normal.        Behavior: Behavior normal.   EKG: normal rate, PACs, unchanged from previous  (optional), or other factors deemed appropriate based on the beneficiary's medical and social history and current clinical standards.  Advanced Directives: Does Patient Have a Medical Advance Directive?: No Would patient like information on creating a medical advance directive?: No - Patient declined    Assessment:    This is a routine wellness  examination for this patient .   Jack Cherry was seen today for [employer].  Diagnoses and all orders for this visit:  Medicare annual wellness visit, initial  Hypertension associated with diabetes ([place]) PAC (premature atrial contraction) EKG unchanged from previous with normal rate and PACs. Denies symptoms. Will decrease amlodipine to see if this reduces palpitations. Increase losartan for elevated BP today.   -     EKG 12-Lead -     amLODipine (NORVASC) 5 MG tablet; Take 1 tablet (5 mg total) by mouth daily. for blood pressure -     losartan (COZAAR) 50 MG tablet; Take 1 tablet (50 mg total) by mouth  daily.  Vision/Hearing screen Vision Screening   Right eye Left eye Both eyes  Without correction '20/25 20/40 20/25 '$  With correction       Dietary issues and exercise activities discussed:  Current Exercise Habits: The patient has a physically strenuous job, but has no regular exercise apart from work., Exercise limited by: None identified   Goals      Exercise 150 min/wk Moderate Activity     Monitor and Manage My Blood Sugar-Diabetes Type 2     Timeframe:  Long-Range Goal Priority:  Low Start Date:  12/03/20                           Expected End Date: 06/02/21  Follow Up Date 12/17/2020    Get a blood sugar meter Check blood sugar everyday before breakfast Write blood sugar down in a log Take log to appointments with [name], [place] Call PCP office [phone] if your blood sugar is below 70 or above 200   Why is this important?   Checking your blood sugar at home helps to keep it from getting very high or very low.  Writing the results in a diary or log helps the doctor know how to care for you.  Your blood sugar log should have the time, date and the results.  Also, write down the amount of insulin or other medicine that you take.  Other information, like what you ate, exercise done and how you were feeling, will also be helpful.     Notes:      Track and Manage My Blood Pressure-Hypertension     Timeframe:  Long-Range Goal Priority:  Medium Start Date:   12/03/2020                          Expected End Date:   06/02/2021                    Follow Up Date 12/17/2020   Get a blood pressure monitor Check blood pressure 3 times a week  Write blood pressure down in log Take log with you to appointment with PCP [name], [place]) Call PCP if your blood pressure is more than 145/90   Why is this important?   You won't feel high blood pressure, but it can still hurt your blood vessels.  High blood pressure can cause heart or kidney problems.  It can also cause a stroke.  Making lifestyle changes like losing a little weight or eating less salt will help.  Checking your blood pressure at home and at different times of the day can help to control blood pressure.  If the doctor prescribes medicine remember to take it the way the doctor ordered.  Call the office if you cannot afford the medicine or if there are questions about it.     Notes:         Depression Screen PHQ 2/9 Scores 07/10/2021 06/10/2021 03/08/2021 12/07/2020  PHQ - 2 Score 0 0 0 0  PHQ- 9 Score 0 0 - -     Fall Risk Fall Risk  07/10/2021  Falls in the past year? 0    Cognitive Function MMSE - Mini Mental State Exam 07/10/2021  Orientation to time 5  Orientation to Place 5  Registration 3  Attention/ Calculation 2  Recall 1  Language- name 2 objects 2  Language- repeat 1  Language- follow 3 step command 3  Language- read & follow direction 1  Write a sentence 1  Copy design 1  Total score 25        Patient Care Team: Gwenlyn Perking, FNP as PCP - General (Family Medicine) Danie Binder, MD (Inactive) as Consulting Physician (Gastroenterology) Ilean China, RN as Case Manager Carver, Elon Alas, DO as Consulting Physician (Internal Medicine)     Plan:   This is a routine wellness  examination for this patient .   Skylier was seen today for [employer].  Diagnoses and all orders for this visit:  Medicare annual wellness visit, initial  Hypertension associated with diabetes ([place]) PAC (premature atrial contraction) EKG unchanged from previous with normal rate and PACs. Denies  symptoms. Will decrease amlodipine to see if this reduces palpitations. Increase losartan for elevated BP today.   -     EKG 12-Lead -     amLODipine (NORVASC) 5 MG tablet; Take 1 tablet (5 mg total) by mouth daily. for blood pressure -     losartan (COZAAR) 50 MG tablet; Take 1 tablet (50 mg total) by mouth daily.  I have personally reviewed and noted the  following in the patient's chart:   Medical and social history Use of alcohol, tobacco or illicit drugs  Current medications and supplements Functional ability and status Nutritional status Physical activity Advanced directives List of other physicians Hospitalizations, surgeries, and ER visits in previous 12 months Vitals Screenings to include cognitive, depression, and falls Referrals and appointments  In addition, I have reviewed and discussed with patient certain preventive protocols, quality metrics, and best practice recommendations. A written personalized care plan for preventive services as well as general preventive health recommendations were provided to patient.    Gwenlyn Perking, FNP 07/10/2021

## 2021-09-09 ENCOUNTER — Ambulatory Visit: Payer: Medicare Other | Admitting: Family Medicine

## 2021-09-13 ENCOUNTER — Other Ambulatory Visit: Payer: Self-pay

## 2021-09-13 ENCOUNTER — Ambulatory Visit (INDEPENDENT_AMBULATORY_CARE_PROVIDER_SITE_OTHER): Payer: Medicare Other | Admitting: Family Medicine

## 2021-09-13 ENCOUNTER — Encounter: Payer: Self-pay | Admitting: Family Medicine

## 2021-09-13 VITALS — BP 152/79 | HR 71 | Temp 98.4°F | Ht 73.0 in | Wt 261.4 lb

## 2021-09-13 DIAGNOSIS — E1159 Type 2 diabetes mellitus with other circulatory complications: Secondary | ICD-10-CM | POA: Diagnosis not present

## 2021-09-13 DIAGNOSIS — Z23 Encounter for immunization: Secondary | ICD-10-CM | POA: Diagnosis not present

## 2021-09-13 DIAGNOSIS — E1169 Type 2 diabetes mellitus with other specified complication: Secondary | ICD-10-CM | POA: Diagnosis not present

## 2021-09-13 DIAGNOSIS — E1165 Type 2 diabetes mellitus with hyperglycemia: Secondary | ICD-10-CM | POA: Diagnosis not present

## 2021-09-13 DIAGNOSIS — E785 Hyperlipidemia, unspecified: Secondary | ICD-10-CM

## 2021-09-13 DIAGNOSIS — I152 Hypertension secondary to endocrine disorders: Secondary | ICD-10-CM

## 2021-09-13 LAB — CMP14+EGFR
ALT: 22 IU/L (ref 0–44)
AST: 23 IU/L (ref 0–40)
Albumin/Globulin Ratio: 2 (ref 1.2–2.2)
Albumin: 4.3 g/dL (ref 3.8–4.8)
Alkaline Phosphatase: 71 IU/L (ref 44–121)
BUN/Creatinine Ratio: 15 (ref 10–24)
BUN: 13 mg/dL (ref 8–27)
Bilirubin Total: 0.2 mg/dL (ref 0.0–1.2)
CO2: 23 mmol/L (ref 20–29)
Calcium: 9.1 mg/dL (ref 8.6–10.2)
Chloride: 106 mmol/L (ref 96–106)
Creatinine, Ser: 0.85 mg/dL (ref 0.76–1.27)
Globulin, Total: 2.2 g/dL (ref 1.5–4.5)
Glucose: 158 mg/dL — ABNORMAL HIGH (ref 70–99)
Potassium: 3.8 mmol/L (ref 3.5–5.2)
Sodium: 141 mmol/L (ref 134–144)
Total Protein: 6.5 g/dL (ref 6.0–8.5)
eGFR: 96 mL/min/{1.73_m2} (ref >59)

## 2021-09-13 LAB — CBC WITH DIFFERENTIAL/PLATELET
Basophils Absolute: 0 10*3/uL (ref 0.0–0.2)
Basos: 1 %
EOS (ABSOLUTE): 0.1 10*3/uL (ref 0.0–0.4)
Eos: 2 %
Hematocrit: 37 % — ABNORMAL LOW (ref 37.5–51.0)
Hemoglobin: 12.4 g/dL — ABNORMAL LOW (ref 13.0–17.7)
Immature Grans (Abs): 0 10*3/uL (ref 0.0–0.1)
Immature Granulocytes: 1 %
Lymphocytes Absolute: 1.1 10*3/uL (ref 0.7–3.1)
Lymphs: 33 %
MCH: 31 pg (ref 26.6–33.0)
MCHC: 33.5 g/dL (ref 31.5–35.7)
MCV: 93 fL (ref 79–97)
Monocytes Absolute: 0.3 10*3/uL (ref 0.1–0.9)
Monocytes: 8 %
Neutrophils Absolute: 1.9 10*3/uL (ref 1.4–7.0)
Neutrophils: 55 %
Platelets: 229 10*3/uL (ref 150–450)
RBC: 4 x10E6/uL — ABNORMAL LOW (ref 4.14–5.80)
RDW: 13.9 % (ref 11.6–15.4)
WBC: 3.5 10*3/uL (ref 3.4–10.8)

## 2021-09-13 LAB — LIPID PANEL
Chol/HDL Ratio: 5 ratio (ref 0.0–5.0)
Cholesterol, Total: 175 mg/dL (ref 100–199)
HDL: 35 mg/dL — ABNORMAL LOW
LDL Chol Calc (NIH): 105 mg/dL — ABNORMAL HIGH (ref 0–99)
Triglycerides: 199 mg/dL — ABNORMAL HIGH (ref 0–149)
VLDL Cholesterol Cal: 35 mg/dL (ref 5–40)

## 2021-09-13 LAB — BAYER DCA HB A1C WAIVED: HB A1C (BAYER DCA - WAIVED): 6.7 % — ABNORMAL HIGH (ref 4.8–5.6)

## 2021-09-13 MED ORDER — AMLODIPINE BESYLATE 5 MG PO TABS: 5.0000 mg | ORAL_TABLET | Freq: Every day | ORAL | 3 refills | Status: DC

## 2021-09-13 NOTE — Patient Instructions (Signed)

## 2021-09-13 NOTE — Progress Notes (Addendum)
Established Patient Office Visit  Subjective:  Patient ID: Jack Cherry, male    DOB: February 24, 1956  Age: 65 y.o. MRN: [id number]  CC:  Chief Complaint  Patient presents with   Medical Management of Chronic Issues   Hyperlipidemia   Diabetes   Hypertension    HPI Jack Cherry presents for chronic follow up.  1. DM Current symptoms include none. Patient denies foot ulcerations, paresthesia of the feet, visual disturbances, vomiting and weight loss.  Current diabetic medications include metformin 500 mg daily Compliant with meds - Yes  Current monitoring regimen: none Any episodes of hypoglycemia? no  Eye exam current (within one year):  no, needs to schedule Weight trend: stable, has lost 5 pounds since last visit  PNA Vaccine UTD?  Yes Urine microalbumin UTD? Yes  Is He on ACE inhibitor or angiotensin II receptor blocker?  No, micro albumin UTD Is He on statin? Yes lipitor Is He on ASA 81 mg daily?  No  2. HTN Complaint with meds - Yes Current Medications - norvasc, losartan. He has only been taking a half tablet of norvasc Pertinent ROS:  Headache - No Fatigue - No Visual Disturbances - No Chest pain - No Dyspnea - No Palpitations - No LE edema - No They report good compliance with medications and can restate their regimen by memory. No medication side effects.  Family, social, and smoking history reviewed.   BP Readings from Last 3 Encounters:  09/13/21 (!) 152/79  07/10/21 (!) 142/86  06/10/21 (!) 157/95   CMP Latest Ref Rng & Units 06/10/2021 03/08/2021 11/23/2020  Glucose 65 - 99 mg/dL 122(H) 144(H) 133(H)  BUN 8 - 27 mg/dL $Remove'13 27 14  'EWHtNBg$ Creatinine 0.76 - 1.27 mg/dL 1.05 1.09 1.08  Sodium 134 - 144 mmol/L 142 141 140  Potassium 3.5 - 5.2 mmol/L 4.3 4.0 4.3  Chloride 96 - 106 mmol/L 106 101 103  CO2 20 - 29 mmol/L $RemoveB'20 20 21  'oSpSQDZW$ Calcium 8.6 - 10.2 mg/dL 9.5 10.4(H) 10.0  Total Protein 6.0 - 8.5 g/dL 6.8 7.3 7.3  Total Bilirubin 0.0 - 1.2 mg/dL 0.3 0.4 0.3   Alkaline Phos 44 - 121 IU/L 56 67 75  AST 0 - 40 IU/L $Remov'19 25 27  'EeaMmT$ ALT 0 - 44 IU/L $Remov'18 22 30      'XpJoCH$ Past Medical History:  Diagnosis Date   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 Oneida Alar three 3-8 mm pol;yps, simple adenomas. internal hemorrhoids. next colonoscopy in 3 years   ESOPHAGOGASTRODUODENOSCOPY N/A 01/08/2018   Dr. Oneida Alar: normal esophagus, mild gastritis/duodenitis, H.pylori   GIVENS CAPSULE STUDY N/A 12/21/2018   Procedure: GIVENS CAPSULE STUDY;  Surgeon: Danie Binder, MD;  [facility];  Service: Endoscopy;  Laterality: N/A;  7:30am   HERNIA REPAIR      Family History  Problem Relation Age of Onset   Cancer Mother        not sure what kind   Heart disease Father 71    Social History   Socioeconomic History   Marital status: Single    Spouse name: Not on file   Number of children: 0   Years of education: 9   Highest education level: 9th grade  Occupational History   Not on file  Tobacco Use   Smoking status: Never   Smokeless tobacco: Never  Vaping Use   Vaping Use: Never used  Substance and Sexual Activity   Alcohol use: No   Drug use: Not Currently    Types: Marijuana    Comment: last use of Marijuana 20 years ago   Sexual activity: Not Currently  Other Topics Concern   Not on file  Social History Narrative   Lives with nephew   Social Determinants of Health   Financial Resource Strain: Low Risk    Difficulty of Paying Living Expenses: Not hard at all  Food Insecurity: No Food Insecurity   Worried About Charity fundraiser in the Last Year: Never true   Arboriculturist in the Last Year: Never true  Transportation Needs: No Transportation Needs   Lack of Transportation (Medical): No   Lack of Transportation (Non-Medical): No  Physical Activity: Not on  file  Stress: No Stress Concern Present   Feeling of Stress : Not at all  Social Connections: Moderately Isolated   Frequency of Communication with Friends and Family: More than three times a week   Frequency of Social Gatherings with Friends and Family: Twice a week   Attends Religious Services: 1 to 4 times per year   Active Member of [employer] or Organizations: No   Attends Music therapist: Never   Marital Status: Never married  Human resources officer Violence: Not At Risk   Fear of Current or Ex-Partner: No   Emotionally Abused: No   Physically Abused: No   Sexually Abused: No    Outpatient Medications Prior to Visit  Medication Sig Dispense Refill   amLODipine (NORVASC) 5 MG tablet Take 1 tablet (5 mg total) by mouth daily. for blood pressure 90 tablet 3   atorvastatin (LIPITOR) 20 MG tablet Take 1 tablet (20 mg total) by mouth daily. 90 tablet 3   losartan (COZAAR) 50 MG tablet Take 1 tablet (50 mg total) by mouth daily. 90 tablet 3   metFORMIN (GLUCOPHAGE XR) 500 MG 24 hr tablet Take 2 tablets (1,000 mg total) by mouth daily with breakfast. 90 tablet 3   No facility-administered medications prior to visit.    No Known Allergies  ROS Review of Systems Negative unless specially indicated above in HPI.   Objective:    Physical Exam Vitals and nursing note reviewed.  Constitutional:      General: He is not in acute distress.    Appearance: He is obese. He is not ill-appearing, toxic-appearing or diaphoretic.  Neck:     Vascular: No carotid bruit.  Cardiovascular:     Rate and Rhythm: Normal rate and regular rhythm.     Heart sounds: Murmur heard.  Pulmonary:     Effort: Pulmonary effort is normal. No respiratory distress.     Breath sounds: Normal breath sounds.  Abdominal:     General: Bowel sounds are normal.     Palpations: Abdomen is soft.  Musculoskeletal:     Cervical back: Neck supple. No tenderness.     Right lower leg: No edema.     Left lower  leg: No edema.  Skin:    General: Skin is warm and dry.  Neurological:     General: No focal deficit present.     Mental Status: He is alert and oriented to person, place, and time.  Psychiatric:        Mood and Affect: Mood normal.  Behavior: Behavior normal.    BP (!) 152/79   Pulse 71   Temp 98.4 F (36.9 C) (Temporal)   Ht $R'6\' 1"'sf$  (1.854 m)   Wt 261 lb 6 oz (118.6 kg)   BMI 34.48 kg/m  Wt Readings from Last 3 Encounters:  09/13/21 261 lb 6 oz (118.6 kg)  07/10/21 259 lb 4 oz (117.6 kg)  06/10/21 260 lb 6 oz (118.1 kg)     Health Maintenance Due  Topic Date Due   Zoster Vaccines- Shingrix (1 of 2) Never done   OPHTHALMOLOGY EXAM  01/19/2019   Pneumonia Vaccine 41+ Years old (2 - PPSV23 if available, else PCV20) 11/23/2021    There are no preventive care reminders to display for this patient.  Lab Results  Component Value Date   TSH 1.870 11/23/2020   Lab Results  Component Value Date   WBC 3.9 06/10/2021   HGB 12.4 (L) 06/10/2021   HCT 37.3 (L) 06/10/2021   MCV 92 06/10/2021   PLT 231 06/10/2021   Lab Results  Component Value Date   NA 142 06/10/2021   K 4.3 06/10/2021   CO2 20 06/10/2021   GLUCOSE 122 (H) 06/10/2021   BUN 13 06/10/2021   CREATININE 1.05 06/10/2021   BILITOT 0.3 06/10/2021   ALKPHOS 56 06/10/2021   AST 19 06/10/2021   ALT 18 06/10/2021   PROT 6.8 06/10/2021   ALBUMIN 4.5 06/10/2021   CALCIUM 9.5 06/10/2021   ANIONGAP 8 02/14/2020   EGFR 79 06/10/2021   Lab Results  Component Value Date   CHOL 199 06/10/2021   Lab Results  Component Value Date   HDL 41 06/10/2021   Lab Results  Component Value Date   LDLCALC 130 (H) 06/10/2021   Lab Results  Component Value Date   TRIG 156 (H) 06/10/2021   Lab Results  Component Value Date   CHOLHDL 4.9 06/10/2021   Lab Results  Component Value Date   HGBA1C 6.7 06/10/2021      Assessment & Plan:   Myrtle was seen today for medical management of chronic issues,  hyperlipidemia, diabetes and hypertension.  Diagnoses and all orders for this visit:  Type 2 diabetes mellitus with hyperglycemia, without long-term current use of insulin ([place]) Well controlled. A1c is 6.7 today. Labs pending. Reminded to schedule eye exam.  -     CBC with Differential/Platelet -     CMP14+EGFR -     Lipid panel -     Bayer DCA Hb A1c Waived  Hyperlipidemia associated with type 2 diabetes mellitus ([place]) On statin. Labs pending.  -     CBC with Differential/Platelet -     CMP14+EGFR -     Lipid panel  Primary hypertension Elevated today. He has only been taking 5 mg of amlodipine. Verified with pharmacist that patient has been picking up 10 mg tablet of amlodipine. He has been cutting this in half, he brought his medications with him today and his amlodipine tablets hav been cut in half. Discussed to take a full tablet for 10 mg, will send in new Rx for this. Continue losartan. Labs pending.  -     CBC with Differential/Platelet -     CMP14+EGFR -     Lipid panel  Flu vaccine today in office.   Follow-up: Return in about 3 months (around 12/14/2021) for chronic follow up.   The patient indicates understanding of these issues and agrees with the plan.    Gwenlyn Perking,  FNP 

## 2021-09-16 ENCOUNTER — Other Ambulatory Visit: Payer: Self-pay | Admitting: Family Medicine

## 2021-09-16 DIAGNOSIS — E1169 Type 2 diabetes mellitus with other specified complication: Secondary | ICD-10-CM

## 2021-09-16 DIAGNOSIS — E1165 Type 2 diabetes mellitus with hyperglycemia: Secondary | ICD-10-CM

## 2021-09-16 MED ORDER — ATORVASTATIN CALCIUM 40 MG PO TABS: 40.0000 mg | ORAL_TABLET | Freq: Every day | ORAL | 3 refills | Status: DC

## 2021-09-16 MED ORDER — AMLODIPINE BESYLATE 10 MG PO TABS: 10.0000 mg | ORAL_TABLET | Freq: Every day | ORAL | 3 refills | Status: DC

## 2021-09-16 NOTE — Addendum Note (Signed)
Addended by: Gwenlyn Perking on: 09/16/2021 03:38 PM   Modules accepted: Orders

## 2021-12-13 ENCOUNTER — Ambulatory Visit (INDEPENDENT_AMBULATORY_CARE_PROVIDER_SITE_OTHER): Payer: Medicare Other | Admitting: Family Medicine

## 2021-12-13 ENCOUNTER — Encounter: Payer: Self-pay | Admitting: Family Medicine

## 2021-12-13 VITALS — BP 134/81 | HR 88 | Temp 97.4°F | Ht 73.0 in | Wt 260.0 lb

## 2021-12-13 DIAGNOSIS — Z6835 Body mass index (BMI) 35.0-35.9, adult: Secondary | ICD-10-CM

## 2021-12-13 DIAGNOSIS — I152 Hypertension secondary to endocrine disorders: Secondary | ICD-10-CM

## 2021-12-13 DIAGNOSIS — E1169 Type 2 diabetes mellitus with other specified complication: Secondary | ICD-10-CM | POA: Diagnosis not present

## 2021-12-13 DIAGNOSIS — E1159 Type 2 diabetes mellitus with other circulatory complications: Secondary | ICD-10-CM | POA: Diagnosis not present

## 2021-12-13 DIAGNOSIS — E785 Hyperlipidemia, unspecified: Secondary | ICD-10-CM

## 2021-12-13 LAB — CMP14+EGFR
ALT: 21 IU/L (ref 0–44)
AST: 24 IU/L (ref 0–40)
Albumin/Globulin Ratio: 1.7 (ref 1.2–2.2)
Albumin: 4.3 g/dL (ref 3.8–4.8)
Alkaline Phosphatase: 73 IU/L (ref 44–121)
BUN/Creatinine Ratio: 14 (ref 10–24)
BUN: 15 mg/dL (ref 8–27)
Bilirubin Total: 0.3 mg/dL (ref 0.0–1.2)
CO2: 23 mmol/L (ref 20–29)
Calcium: 9.5 mg/dL (ref 8.6–10.2)
Chloride: 103 mmol/L (ref 96–106)
Creatinine, Ser: 1.11 mg/dL (ref 0.76–1.27)
Globulin, Total: 2.6 g/dL (ref 1.5–4.5)
Glucose: 162 mg/dL — ABNORMAL HIGH (ref 70–99)
Potassium: 4.4 mmol/L (ref 3.5–5.2)
Sodium: 140 mmol/L (ref 134–144)
Total Protein: 6.9 g/dL (ref 6.0–8.5)
eGFR: 73 mL/min/{1.73_m2} (ref >59)

## 2021-12-13 LAB — CBC WITH DIFFERENTIAL/PLATELET
Basophils Absolute: 0 10*3/uL (ref 0.0–0.2)
Basos: 1 %
EOS (ABSOLUTE): 0.1 10*3/uL (ref 0.0–0.4)
Eos: 2 %
Hematocrit: 38.2 % (ref 37.5–51.0)
Hemoglobin: 12.7 g/dL — ABNORMAL LOW (ref 13.0–17.7)
Immature Grans (Abs): 0 10*3/uL (ref 0.0–0.1)
Immature Granulocytes: 1 %
Lymphocytes Absolute: 1.4 10*3/uL (ref 0.7–3.1)
Lymphs: 37 %
MCH: 30.4 pg (ref 26.6–33.0)
MCHC: 33.2 g/dL (ref 31.5–35.7)
MCV: 91 fL (ref 79–97)
Monocytes Absolute: 0.3 10*3/uL (ref 0.1–0.9)
Monocytes: 8 %
Neutrophils Absolute: 1.9 10*3/uL (ref 1.4–7.0)
Neutrophils: 51 %
Platelets: 237 10*3/uL (ref 150–450)
RBC: 4.18 x10E6/uL (ref 4.14–5.80)
RDW: 14 % (ref 11.6–15.4)
WBC: 3.7 10*3/uL (ref 3.4–10.8)

## 2021-12-13 LAB — BAYER DCA HB A1C WAIVED: HB A1C (BAYER DCA - WAIVED): 6.7 % — ABNORMAL HIGH (ref 4.8–5.6)

## 2021-12-13 MED ORDER — ATORVASTATIN CALCIUM 40 MG PO TABS: 40.0000 mg | ORAL_TABLET | Freq: Every day | ORAL | 3 refills | Status: DC

## 2021-12-13 MED ORDER — METFORMIN HCL ER 500 MG PO TB24: 1000.0000 mg | ORAL_TABLET | Freq: Every day | ORAL | 3 refills | Status: DC

## 2021-12-13 NOTE — Progress Notes (Signed)
Subjective:  Patient ID: Jack Cherry, male    DOB: 1956/10/07, 66 y.o.   MRN: [id number]  Patient Care Team: Gwenlyn Perking, FNP as PCP - General (Family Medicine) Danie Binder, MD (Inactive) as Consulting Physician (Gastroenterology) Ilean China, RN as Case Manager Abbey Chatters, Elon Alas, DO as Consulting Physician (Internal Medicine)   Chief Complaint:  Medical Management of Chronic Issues   HPI: Jack Cherry is a 66 y.o. male presenting on 12/13/2021 for Medical Management of Chronic Issues   1. Type 2 diabetes mellitus without long-term current use of insulin (HCC) Pt presents for follow up evaluation of Type 2 diabetes mellitus.  Current symptoms include none. Patient denies foot ulcerations, hyperglycemia, hypoglycemia , increased appetite, nausea, paresthesia of the feet, polydipsia, polyuria, visual disturbances, vomiting, and weight loss.  Current diabetic medications include metformin Compliant with meds - Yes  Known diabetic complications: cardiovascular disease and hypertension, hyperlipidemia Cardiovascular risk factors: advanced age (older than 86 for men, 73 for women), diabetes mellitus, dyslipidemia, hypertension, male gender, and obesity (BMI >= 30 kg/m2) Eye exam current (within one year): no Podiatry yearly?  No Weight trend: stable Current diet: well balanced Current exercise: none  2. Hypertension associated with diabetes ([place]) Complaint with meds - Yes Current Medications - norvasc and losartan Checking BP at home - No Exercising Regularly - No Watching Salt intake - No Pertinent ROS:  Headache - No Fatigue - No Visual Disturbances - No Chest pain - No Dyspnea - No Palpitations - No LE edema - No They report good compliance with medications and can restate their regimen by memory. No medication side effects.  BP Readings from Last 3 Encounters:  12/13/21 134/81  09/13/21 (!) 152/79  07/10/21 (!) 142/86     3. Hyperlipidemia  associated with type 2 diabetes mellitus ([place]) Compliant with medications - Yes Current medications - atorvastatin Side effects from medications - No  4. Class 2 severe obesity due to excess calories with serious comorbidity and body mass index (BMI) of 35.0 to 35.9 in adult [facility]) Pt does not follow a diet or exercise plan. States he eats what he likes but does try to cut down on portion sizes.      Relevant past medical, surgical, family, and social history reviewed and updated as indicated.  Allergies and medications reviewed and updated. Data reviewed: Chart in Epic.   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 Oneida Alar three 3-8 mm pol;yps, simple adenomas. internal hemorrhoids. next colonoscopy in 3 years   ESOPHAGOGASTRODUODENOSCOPY N/A 01/08/2018   Dr. Oneida Alar: normal esophagus, mild gastritis/duodenitis, H.pylori   GIVENS CAPSULE STUDY N/A 12/21/2018   Procedure: GIVENS CAPSULE STUDY;  Surgeon: Danie Binder, MD;  Location: [facility];  Service: Endoscopy;  Laterality: N/A;  7:30am   HERNIA REPAIR      Social History   Socioeconomic History   Marital status: Single    Spouse name: Not on file   Number of children: 0   Years of education: 9   Highest education level: 9th grade  Occupational History   Not on file  Tobacco Use   Smoking status: Never   Smokeless tobacco: Never  Vaping Use   Vaping  Use: Never used  Substance and Sexual Activity   Alcohol use: No   Drug use: Not Currently    Comment: last use of Marijuana 20 years ago   Sexual activity: Not Currently  Other Topics Concern   Not on file  Social History Narrative   Lives with nephew   Social Determinants of Health   Financial Resource Strain: Low Risk    Difficulty of Paying  Living Expenses: Not hard at all  Food Insecurity: Not on file  Transportation Needs: Not on file  Physical Activity: Not on file  Stress: No Stress Concern Present   Feeling of Stress : Not at all  Social Connections: Not on file  Intimate Partner Violence: Not At Risk   Fear of Current or Ex-Partner: No   Emotionally Abused: No   Physically Abused: No   Sexually Abused: No    Outpatient Encounter Medications as of 12/13/2021  Medication Sig   amLODipine (NORVASC) 10 MG tablet Take 1 tablet (10 mg total) by mouth daily.   atorvastatin (LIPITOR) 40 MG tablet Take 1 tablet (40 mg total) by mouth daily.   losartan (COZAAR) 50 MG tablet Take 1 tablet (50 mg total) by mouth daily.   metFORMIN (GLUCOPHAGE XR) 500 MG 24 hr tablet Take 2 tablets (1,000 mg total) by mouth daily with breakfast.   [DISCONTINUED] atorvastatin (LIPITOR) 40 MG tablet Take 1 tablet (40 mg total) by mouth daily.   [DISCONTINUED] metFORMIN (GLUCOPHAGE XR) 500 MG 24 hr tablet Take 2 tablets (1,000 mg total) by mouth daily with breakfast.   No facility-administered encounter medications on file as of 12/13/2021.    No Known Allergies  Review of Systems  Constitutional:  Negative for activity change, appetite change, chills, diaphoresis, fatigue, fever and unexpected weight change.  HENT: Negative.    Eyes: Negative.  Negative for photophobia and visual disturbance.  Respiratory:  Negative for cough, chest tightness and shortness of breath.   Cardiovascular:  Negative for chest pain, palpitations and leg swelling.  Gastrointestinal:  Negative for abdominal pain, blood in stool, constipation, diarrhea, nausea and vomiting.  Endocrine: Negative.  Negative for polydipsia, polyphagia and polyuria.  Genitourinary:  Negative for decreased urine volume, difficulty urinating, dysuria, frequency and urgency.  Musculoskeletal:  Negative for arthralgias and myalgias.  Skin: Negative.   Allergic/Immunologic: Negative.    Neurological:  Negative for dizziness, tremors, seizures, syncope, facial asymmetry, speech difficulty, weakness, light-headedness, numbness and headaches.  Hematological: Negative.   Psychiatric/Behavioral:  Negative for confusion, hallucinations, sleep disturbance and suicidal ideas.   All other systems reviewed and are negative.      Objective:  BP 134/81    Pulse 88    Temp (!) 97.4 F (36.3 C) (Temporal)    Ht $R'6\' 1"'CX$  (1.854 m)    Wt 260 lb (117.9 kg)    SpO2 93%    BMI 34.30 kg/m    Wt Readings from Last 3 Encounters:  12/13/21 260 lb (117.9 kg)  09/13/21 261 lb 6 oz (118.6 kg)  07/10/21 259 lb 4 oz (117.6 kg)    Physical Exam Vitals and nursing note reviewed.  Constitutional:      General: He is not in acute distress.    Appearance: Normal appearance. He is well-developed and well-groomed. He is obese. He is not ill-appearing, toxic-appearing or diaphoretic.  HENT:     Head: Normocephalic and atraumatic.     Jaw: There is normal jaw occlusion.     Right Ear: Hearing, tympanic  membrane, ear canal and external ear normal.     Left Ear: Hearing, tympanic membrane, ear canal and external ear normal.     Nose: Nose normal.     Mouth/Throat:     Lips: Pink.     Mouth: Mucous membranes are moist.     Pharynx: Oropharynx is clear. Uvula midline.  Eyes:     General: Lids are normal.     Extraocular Movements: Extraocular movements intact.     Conjunctiva/sclera: Conjunctivae normal.     Pupils: Pupils are equal, round, and reactive to light.  Neck:     Thyroid: No thyroid mass, thyromegaly or thyroid tenderness.     Vascular: No carotid bruit or JVD.     Trachea: Trachea and phonation normal.  Cardiovascular:     Rate and Rhythm: Normal rate and regular rhythm.     Chest Wall: PMI is not displaced.     Pulses: Normal pulses.     Heart sounds: Murmur heard.  Systolic (loudest at RUSB) murmur is present with a grade of 3/6.    No friction rub. No gallop.  Pulmonary:      Effort: Pulmonary effort is normal. No respiratory distress.     Breath sounds: Normal breath sounds. No wheezing.  Abdominal:     General: Bowel sounds are normal. There is no distension or abdominal bruit.     Palpations: Abdomen is soft. There is no hepatomegaly or splenomegaly.     Tenderness: There is no abdominal tenderness. There is no right CVA tenderness or left CVA tenderness.     Hernia: No hernia is present.  Musculoskeletal:        General: Normal range of motion.     Cervical back: Normal range of motion and neck supple.     Right lower leg: No edema.     Left lower leg: No edema.  Lymphadenopathy:     Cervical: No cervical adenopathy.  Skin:    General: Skin is warm and dry.     Capillary Refill: Capillary refill takes less than 2 seconds.     Coloration: Skin is not cyanotic, jaundiced or pale.     Findings: No rash.  Neurological:     General: No focal deficit present.     Mental Status: He is alert and oriented to person, place, and time.     Sensory: Sensation is intact.     Motor: Motor function is intact.     Coordination: Coordination is intact.     Gait: Gait is intact.     Deep Tendon Reflexes: Reflexes are normal and symmetric.  Psychiatric:        Attention and Perception: Attention and perception normal.        Mood and Affect: Mood and affect normal.        Speech: Speech normal.        Behavior: Behavior normal. Behavior is cooperative.        Thought Content: Thought content normal.        Cognition and Memory: Cognition and memory normal.        Judgment: Judgment normal.    Results for orders placed or performed in visit on 09/13/21  CBC with Differential/Platelet  Result Value Ref Range   WBC 3.5 3.4 - 10.8 x10E3/uL   RBC 4.00 (L) 4.14 - 5.80 x10E6/uL   Hemoglobin 12.4 (L) 13.0 - 17.7 g/dL   Hematocrit 37.0 (L) 37.5 - 51.0 %   MCV 93 79 -  97 fL   MCH 31.0 26.6 - 33.0 pg   MCHC 33.5 31.5 - 35.7 g/dL   RDW 13.9 11.6 - 15.4 %   Platelets  229 150 - 450 x10E3/uL   Neutrophils 55 Not Estab. %   Lymphs 33 Not Estab. %   Monocytes 8 Not Estab. %   Eos 2 Not Estab. %   Basos 1 Not Estab. %   Neutrophils Absolute 1.9 1.4 - 7.0 x10E3/uL   Lymphocytes Absolute 1.1 0.7 - 3.1 x10E3/uL   Monocytes Absolute 0.3 0.1 - 0.9 x10E3/uL   EOS (ABSOLUTE) 0.1 0.0 - 0.4 x10E3/uL   Basophils Absolute 0.0 0.0 - 0.2 x10E3/uL   Immature Granulocytes 1 Not Estab. %   Immature Grans (Abs) 0.0 0.0 - 0.1 x10E3/uL  CMP14+EGFR  Result Value Ref Range   Glucose 158 (H) 70 - 99 mg/dL   BUN 13 8 - 27 mg/dL   Creatinine, Ser 0.85 0.76 - 1.27 mg/dL   eGFR 96 >59 mL/min/1.73   BUN/Creatinine Ratio 15 10 - 24   Sodium 141 134 - 144 mmol/L   Potassium 3.8 3.5 - 5.2 mmol/L   Chloride 106 96 - 106 mmol/L   CO2 23 20 - 29 mmol/L   Calcium 9.1 8.6 - 10.2 mg/dL   Total Protein 6.5 6.0 - 8.5 g/dL   Albumin 4.3 3.8 - 4.8 g/dL   Globulin, Total 2.2 1.5 - 4.5 g/dL   Albumin/Globulin Ratio 2.0 1.2 - 2.2   Bilirubin Total 0.2 0.0 - 1.2 mg/dL   Alkaline Phosphatase 71 44 - 121 IU/L   AST 23 0 - 40 IU/L   ALT 22 0 - 44 IU/L  Lipid panel  Result Value Ref Range   Cholesterol, Total 175 100 - 199 mg/dL   Triglycerides 199 (H) 0 - 149 mg/dL   HDL 35 (L) >39 mg/dL   VLDL Cholesterol Cal 35 5 - 40 mg/dL   LDL Chol Calc (NIH) 105 (H) 0 - 99 mg/dL   Chol/HDL Ratio 5.0 0.0 - 5.0 ratio  Bayer DCA Hb A1c Waived  Result Value Ref Range   HB A1C (BAYER DCA - WAIVED) 6.7 (H) 4.8 - 5.6 %       Pertinent labs & imaging results that were available during my care of the patient were reviewed by me and considered in my medical decision making.  Assessment & Plan:  Jack Cherry was seen today for medical management of chronic issues.  Diagnoses and all orders for this visit:  Type 2 diabetes mellitus with other specified complication, without long-term current use of insulin ([place]) A1C 6.7 in office, well controlled. Continue current regimen. Diet and exercise encouraged.  To schedule appointment for retina exam with [facility] in office.  -     Bayer DCA Hb A1c Waived -     CBC with Differential/Platelet -     CMP14+EGFR -     metFORMIN (GLUCOPHAGE XR) 500 MG 24 hr tablet; Take 2 tablets (1,000 mg total) by mouth daily with breakfast.  Hypertension associated with diabetes ([place]) Well controlled on currant regimen, continue. DASH diet and exercise encouraged.  -     Bayer DCA Hb A1c Waived -     CBC with Differential/Platelet -     CMP14+EGFR  Hyperlipidemia associated with type 2 diabetes mellitus ([place]) On statin therapy and tolerating well. Last lipid panel acceptable. Diet and exercise encouraged.  -     Bayer DCA Hb A1c Waived -  CBC with Differential/Platelet -     CMP14+EGFR -     atorvastatin (LIPITOR) 40 MG tablet; Take 1 tablet (40 mg total) by mouth daily.  Class 2 severe obesity due to excess calories with serious comorbidity and body mass index (BMI) of 35.0 to 35.9 in adult [facility]) Diet and exercise encouraged.  -     Bayer DCA Hb A1c Waived -     CBC with Differential/Platelet -     CMP14+EGFR     Continue all other maintenance medications.  Follow up plan: Return in about 3 months (around 03/12/2022), or if symptoms worsen or fail to improve, for DM.   Continue healthy lifestyle choices, including diet (rich in fruits, vegetables, and lean proteins, and low in salt and simple carbohydrates) and exercise (at least 30 minutes of moderate physical activity daily).  Educational handout given for DM  The above assessment and management plan was discussed with the patient. The patient verbalized understanding of and has agreed to the management plan. Patient is aware to call the clinic if they develop any new symptoms or if symptoms persist or worsen. Patient is aware when to return to the clinic for a follow-up visit. Patient educated on when it is appropriate to go to the emergency department.   Monia Pouch, FNP-C [place]  [facility] [phone]

## 2021-12-13 NOTE — Patient Instructions (Addendum)
Need to schedule appointment for eye exam   Continue to monitor your blood sugars as we discussed and record them. Bring the log to your next appointment.  Take your medications as directed.    Goal Blood glucose:    Fasting (before meals) = 80 to 130   Within 2 hours of eating = less than 180   Understanding your Hemoglobin A1c: 6.7     Diabetes Mellitus and Nutrition    I think that you would greatly benefit from seeing a nutritionist. If this is something you are interested in, please call Dr Jenne Campus at [phone] to schedule an appointment.   When you have diabetes (diabetes mellitus), it is very important to have healthy eating habits because your blood sugar (glucose) levels are greatly affected by what you eat and drink. Eating healthy foods in the appropriate amounts, at about the same times every day, can help you: Control your blood glucose. Lower your risk of heart disease. Improve your blood pressure. Reach or maintain a healthy weight.  Every person with diabetes is different, and each person has different needs for a meal plan. Your health care provider may recommend that you work with a diet and nutrition specialist (dietitian) to make a meal plan that is best for you. Your meal plan may vary depending on factors such as: The calories you need. The medicines you take. Your weight. Your blood glucose, blood pressure, and cholesterol levels. Your activity level. Other health conditions you have, such as heart or kidney disease.  How do carbohydrates affect me? Carbohydrates affect your blood glucose level more than any other type of food. Eating carbohydrates naturally increases the amount of glucose in your blood. Carbohydrate counting is a method for keeping track of how many carbohydrates you eat. Counting carbohydrates is important to keep your blood glucose at a healthy level, especially if you use insulin or take certain oral diabetes medicines. It is  important to know how many carbohydrates you can safely have in each meal. This is different for every person. Your dietitian can help you calculate how many carbohydrates you should have at each meal and for snack. Foods that contain carbohydrates include: Bread, cereal, rice, pasta, and crackers. Potatoes and corn. Peas, beans, and lentils. Milk and yogurt. Fruit and juice. Desserts, such as cakes, cookies, ice cream, and candy.  How does alcohol affect me? Alcohol can cause a sudden decrease in blood glucose (hypoglycemia), especially if you use insulin or take certain oral diabetes medicines. Hypoglycemia can be a life-threatening condition. Symptoms of hypoglycemia (sleepiness, dizziness, and confusion) are similar to symptoms of having too much alcohol. If your health care provider says that alcohol is safe for you, follow these guidelines: Limit alcohol intake to no more than 1 drink per day for nonpregnant women and 2 drinks per day for men. One drink equals 12 oz of beer, 5 oz of wine, or 1 oz of hard liquor. Do not drink on an empty stomach. Keep yourself hydrated with water, diet soda, or unsweetened iced tea. Keep in mind that regular soda, juice, and other mixers may contain a lot of sugar and must be counted as carbohydrates.  What are tips for following this plan?  Reading food labels Start by checking the serving size on the label. The amount of calories, carbohydrates, fats, and other nutrients listed on the label are based on one serving of the food. Many foods contain more than one serving per package. Check the total grams (  g) of carbohydrates in one serving. You can calculate the number of servings of carbohydrates in one serving by dividing the total carbohydrates by 15. For example, if a food has 30 g of total carbohydrates, it would be equal to 2 servings of carbohydrates. Check the number of grams (g) of saturated and trans fats in one serving. Choose foods that have  low or no amount of these fats. Check the number of milligrams (mg) of sodium in one serving. Most people should limit total sodium intake to less than 2,300 mg per day. Always check the nutrition information of foods labeled as "low-fat" or "nonfat". These foods may be higher in added sugar or refined carbohydrates and should be avoided. Talk to your dietitian to identify your daily goals for nutrients listed on the label.  Shopping Avoid buying canned, premade, or processed foods. These foods tend to be high in fat, sodium, and added sugar. Shop around the outside edge of the grocery store. This includes fresh fruits and vegetables, bulk grains, fresh meats, and fresh dairy.  Cooking Use low-heat cooking methods, such as baking, instead of high-heat cooking methods like deep frying. Cook using healthy oils, such as olive, canola, or sunflower oil. Avoid cooking with butter, cream, or high-fat meats.  Meal planning Eat meals and snacks regularly, preferably at the same times every day. Avoid going long periods of time without eating. Eat foods high in fiber, such as fresh fruits, vegetables, beans, and whole grains. Talk to your dietitian about how many servings of carbohydrates you can eat at each meal. Eat 4-6 ounces of lean protein each day, such as lean meat, chicken, fish, eggs, or tofu. 1 ounce is equal to 1 ounce of meat, chicken, or fish, 1 egg, or 1/4 cup of tofu. Eat some foods each day that contain healthy fats, such as avocado, nuts, seeds, and fish.  Lifestyle  Check your blood glucose regularly. Exercise at least 30 minutes 5 or more days each week, or as told by your health care provider. Take medicines as told by your health care provider. Do not use any products that contain nicotine or tobacco, such as cigarettes and e-cigarettes. If you need help quitting, ask your health care provider. Work with a Social worker or diabetes educator to identify strategies to manage stress  and any emotional and social challenges.  What are some questions to ask my health care provider? Do I need to meet with a diabetes educator? Do I need to meet with a dietitian? What number can I call if I have questions? When are the best times to check my blood glucose?  Where to find more information: American Diabetes Association: diabetes.org/food-and-fitness/food Academy of Nutrition and Dietetics: PokerClues.dk [employer] of Diabetes and Digestive and Kidney Diseases (NIH): ContactWire.be  Summary A healthy meal plan will help you control your blood glucose and maintain a healthy lifestyle. Working with a diet and nutrition specialist (dietitian) can help you make a meal plan that is best for you. Keep in mind that carbohydrates and alcohol have immediate effects on your blood glucose levels. It is important to count carbohydrates and to use alcohol carefully. This information is not intended to replace advice given to you by your health care provider. Make sure you discuss any questions you have with your health care provider. Document Released: 07/17/2005 Document Revised: 11/24/2016 Document Reviewed: 11/24/2016 Elsevier Interactive Patient Education  [employer].

## 2022-03-12 ENCOUNTER — Ambulatory Visit (INDEPENDENT_AMBULATORY_CARE_PROVIDER_SITE_OTHER): Payer: Medicare Other | Admitting: Family Medicine

## 2022-03-12 ENCOUNTER — Encounter: Payer: Self-pay | Admitting: Family Medicine

## 2022-03-12 VITALS — BP 132/77 | HR 74 | Temp 98.1°F | Ht 73.0 in | Wt 256.5 lb

## 2022-03-12 DIAGNOSIS — E1159 Type 2 diabetes mellitus with other circulatory complications: Secondary | ICD-10-CM

## 2022-03-12 DIAGNOSIS — E785 Hyperlipidemia, unspecified: Secondary | ICD-10-CM

## 2022-03-12 DIAGNOSIS — Z23 Encounter for immunization: Secondary | ICD-10-CM

## 2022-03-12 DIAGNOSIS — E1169 Type 2 diabetes mellitus with other specified complication: Secondary | ICD-10-CM

## 2022-03-12 DIAGNOSIS — I152 Hypertension secondary to endocrine disorders: Secondary | ICD-10-CM

## 2022-03-12 DIAGNOSIS — Z1159 Encounter for screening for other viral diseases: Secondary | ICD-10-CM

## 2022-03-12 LAB — BAYER DCA HB A1C WAIVED: HB A1C (BAYER DCA - WAIVED): 6.5 % — ABNORMAL HIGH (ref 4.8–5.6)

## 2022-03-12 NOTE — Progress Notes (Signed)
? ?Established Patient Office Visit ? ?Subjective:  ?Patient ID: Jack Cherry, male    DOB: 1956/08/16  Age: 66 y.o. MRN: [id number] ? ?CC:  ?Chief Complaint  ?Patient presents with  ? Medical Management of Chronic Issues  ? Diabetes  ? Hypertension  ? Hyperlipidemia  ? ? ?HPI ?Jack Cherry presents for chronic follow up. ? ?1. DM ?Current symptoms include none. Patient denies foot ulcerations, paresthesia of the feet, visual disturbances, vomiting and weight loss. ? ?Current diabetic medications include metformin 500 mg daily ?Compliant with meds - Yes ? ?Current monitoring regimen: none ?Any episodes of hypoglycemia? no ? ?Eye exam current (within one year):  no, needs to schedule ?Weight trend: stable, has lost 4 pounds since last visit ? ?Is He on ACE inhibitor or angiotensin II receptor blocker?  ?Yes, losartan ?Is He on statin? Yes lipitor ?Is He on ASA 81 mg daily?  No ? ?2. HTN ?Complaint with meds - Yes ?Current Medications - norvasc, losartan.  ?Pertinent ROS:  ?Headache - No ?Fatigue - No ?Visual Disturbances - No ?Chest pain - No ?Dyspnea - No ?Palpitations - No ?LE edema - No ? ? ?BP Readings from Last 3 Encounters:  ?03/12/22 132/77  ?12/13/21 134/81  ?09/13/21 (!) 152/79  ? ? ?  Latest Ref Rng & Units 12/13/2021  ?  8:49 AM 09/13/2021  ?  8:39 AM 06/10/2021  ?  8:14 AM  ?CMP  ?Glucose 70 - 99 mg/dL 162   158   122    ?BUN 8 - 27 mg/dL $Remove'15   13   13    'ArHWyYG$ ?Creatinine 0.76 - 1.27 mg/dL 1.11   0.85   1.05    ?Sodium 134 - 144 mmol/L 140   141   142    ?Potassium 3.5 - 5.2 mmol/L 4.4   3.8   4.3    ?Chloride 96 - 106 mmol/L 103   106   106    ?CO2 20 - 29 mmol/L $RemoveB'23   23   20    'HvCKxCgv$ ?Calcium 8.6 - 10.2 mg/dL 9.5   9.1   9.5    ?Total Protein 6.0 - 8.5 g/dL 6.9   6.5   6.8    ?Total Bilirubin 0.0 - 1.2 mg/dL 0.3   0.2   0.3    ?Alkaline Phos 44 - 121 IU/L 73   71   56    ?AST 0 - 40 IU/L $Remov'24   23   19    'ytaGuS$ ?ALT 0 - 44 IU/L $Remov'21   22   18    'SZJuVf$ ?  ? ? ?Past Medical History:  ?Diagnosis Date  ? Amputation finger   ? partial-  end of 2 left fingers following lawnmover incident  ? Diabetes ([place])   ? High cholesterol   ? Hypertension   ? ? ?Past Surgical History:  ?Procedure Laterality Date  ? COLONOSCOPY N/A 01/08/2018  ? Dr. Oneida Alar three 3-8 mm pol;yps, simple adenomas. internal hemorrhoids. next colonoscopy in 3 years  ? ESOPHAGOGASTRODUODENOSCOPY N/A 01/08/2018  ? Dr. Oneida Alar: normal esophagus, mild gastritis/duodenitis, H.pylori  ? GIVENS CAPSULE STUDY N/A 12/21/2018  ? Procedure: GIVENS CAPSULE STUDY;  Surgeon: Danie Binder, MD;  [facility];  Service: Endoscopy;  Laterality: N/A;  7:30am  ? HERNIA REPAIR    ? ? ?Family History  ?Problem Relation Age of Onset  ? Cancer Mother   ?     not sure what kind  ? Heart disease Father  80  ? ? ?Social History  ? ?Socioeconomic History  ? Marital status: Single  ?  Spouse name: Not on file  ? Number of children: 0  ? Years of education: 68  ? Highest education level: 9th grade  ?Occupational History  ? Not on file  ?Tobacco Use  ? Smoking status: Never  ? Smokeless tobacco: Never  ?Vaping Use  ? Vaping Use: Never used  ?Substance and Sexual Activity  ? Alcohol use: No  ? Drug use: Not Currently  ?  Comment: last use of Marijuana 20 years ago  ? Sexual activity: Not Currently  ?Other Topics Concern  ? Not on file  ?Social History Narrative  ? Lives with nephew  ? ?Social Determinants of Health  ? ?Financial Resource Strain: Low Risk   ? Difficulty of Paying Living Expenses: Not hard at all  ?Food Insecurity: Not on file  ?Transportation Needs: Not on file  ?Physical Activity: Not on file  ?Stress: No Stress Concern Present  ? Feeling of Stress : Not at all  ?Social Connections: Not on file  ?Intimate Partner Violence: Not At Risk  ? Fear of Current or Ex-Partner: No  ? Emotionally Abused: No  ? Physically Abused: No  ? Sexually Abused: No  ? ? ?Outpatient Medications Prior to Visit  ?Medication Sig Dispense Refill  ? amLODipine (NORVASC) 10 MG tablet Take 1 tablet (10 mg total) by mouth  daily. 90 tablet 3  ? atorvastatin (LIPITOR) 40 MG tablet Take 1 tablet (40 mg total) by mouth daily. 90 tablet 3  ? losartan (COZAAR) 50 MG tablet Take 1 tablet (50 mg total) by mouth daily. 90 tablet 3  ? metFORMIN (GLUCOPHAGE XR) 500 MG 24 hr tablet Take 2 tablets (1,000 mg total) by mouth daily with breakfast. 90 tablet 3  ? ?No facility-administered medications prior to visit.  ? ? ?No Known Allergies ? ?ROS ?Review of Systems ?Negative unless specially indicated above in HPI. ?  ?Objective:  ?  ?Physical Exam ?Vitals and nursing note reviewed.  ?Constitutional:   ?   General: He is not in acute distress. ?   Appearance: He is obese. He is not ill-appearing, toxic-appearing or diaphoretic.  ?HENT:  ?   Head: Normocephalic and atraumatic.  ?   Right Ear: Tympanic membrane, ear canal and external ear normal.  ?   Left Ear: Tympanic membrane, ear canal and external ear normal.  ?   Nose: Nose normal.  ?   Mouth/Throat:  ?   Mouth: Mucous membranes are moist.  ?   Pharynx: Oropharynx is clear.  ?Eyes:  ?   Extraocular Movements: Extraocular movements intact.  ?   Pupils: Pupils are equal, round, and reactive to light.  ?Neck:  ?   Vascular: No carotid bruit.  ?Cardiovascular:  ?   Rate and Rhythm: Normal rate and regular rhythm.  ?   Heart sounds: Murmur heard.  ?Pulmonary:  ?   Effort: Pulmonary effort is normal. No respiratory distress.  ?   Breath sounds: Normal breath sounds.  ?Abdominal:  ?   General: Bowel sounds are normal.  ?   Palpations: Abdomen is soft.  ?   Tenderness: There is no abdominal tenderness. There is no guarding or rebound.  ?Musculoskeletal:  ?   Cervical back: Neck supple. No tenderness.  ?   Right lower leg: No edema.  ?   Left lower leg: No edema.  ?Skin: ?   General: Skin is warm and  dry.  ?Neurological:  ?   General: No focal deficit present.  ?   Mental Status: He is alert and oriented to person, place, and time.  ?Psychiatric:     ?   Mood and Affect: Mood normal.     ?   Behavior:  Behavior normal.  ? ? ?BP 132/77   Pulse 74   Temp 98.1 ?F (36.7 ?C) (Temporal)   Ht $R'6\' 1"'VQ$  (1.854 m)   Wt 256 lb 8 oz (116.3 kg)   SpO2 98%   BMI 33.84 kg/m?  ?Wt Readings from Last 3 Encounters:  ?03/12/22 256 lb 8 oz (116.3 kg)  ?12/13/21 260 lb (117.9 kg)  ?09/13/21 261 lb 6 oz (118.6 kg)  ? ? ? ?Health Maintenance Due  ?Topic Date Due  ? Hepatitis C Screening  Never done  ? TETANUS/TDAP  Never done  ? Zoster Vaccines- Shingrix (1 of 2) Never done  ? OPHTHALMOLOGY EXAM  01/19/2019  ? Pneumonia Vaccine 53+ Years old (2 - PPSV23 if available, else PCV20) 11/23/2021  ? ? ?There are no preventive care reminders to display for this patient. ? ?Lab Results  ?Component Value Date  ? TSH 1.870 11/23/2020  ? ?Lab Results  ?Component Value Date  ? WBC 3.7 12/13/2021  ? HGB 12.7 (L) 12/13/2021  ? HCT 38.2 12/13/2021  ? MCV 91 12/13/2021  ? PLT 237 12/13/2021  ? ?Lab Results  ?Component Value Date  ? NA 140 12/13/2021  ? K 4.4 12/13/2021  ? CO2 23 12/13/2021  ? GLUCOSE 162 (H) 12/13/2021  ? BUN 15 12/13/2021  ? CREATININE 1.11 12/13/2021  ? BILITOT 0.3 12/13/2021  ? ALKPHOS 73 12/13/2021  ? AST 24 12/13/2021  ? ALT 21 12/13/2021  ? PROT 6.9 12/13/2021  ? ALBUMIN 4.3 12/13/2021  ? CALCIUM 9.5 12/13/2021  ? ANIONGAP 8 02/14/2020  ? EGFR 73 12/13/2021  ? ?Lab Results  ?Component Value Date  ? CHOL 175 09/13/2021  ? ?Lab Results  ?Component Value Date  ? HDL 35 (L) 09/13/2021  ? ?Lab Results  ?Component Value Date  ? LDLCALC 105 (H) 09/13/2021  ? ?Lab Results  ?Component Value Date  ? TRIG 199 (H) 09/13/2021  ? ?Lab Results  ?Component Value Date  ? CHOLHDL 5.0 09/13/2021  ? ?Lab Results  ?Component Value Date  ? HGBA1C 6.7 (H) 12/13/2021  ? ? ?  ?Assessment & Plan:  ? ?Jaquarious was seen today for medical management of chronic issues, diabetes, hypertension and hyperlipidemia. ? ?Diagnoses and all orders for this visit: ? ?Type 2 diabetes mellitus with other specified complication, without long-term current use of insulin  ([place]) ?A1c 6.5 today, well controlled, at goal of <7. Labs pending. Will schedule eye exam with [facility]. On ARB and statin.  ?-     Microalbumin / creatinine urine ratio ?-     Bayer DCA Hb A1c Waived ? ?Hypertensio

## 2022-03-13 LAB — CBC WITH DIFFERENTIAL/PLATELET
Basophils Absolute: 0 10*3/uL (ref 0.0–0.2)
Basos: 1 %
EOS (ABSOLUTE): 0.1 10*3/uL (ref 0.0–0.4)
Eos: 2 %
Hematocrit: 37 % — ABNORMAL LOW (ref 37.5–51.0)
Hemoglobin: 12.5 g/dL — ABNORMAL LOW (ref 13.0–17.7)
Immature Grans (Abs): 0 10*3/uL (ref 0.0–0.1)
Immature Granulocytes: 1 %
Lymphocytes Absolute: 1.4 10*3/uL (ref 0.7–3.1)
Lymphs: 38 %
MCH: 31 pg (ref 26.6–33.0)
MCHC: 33.8 g/dL (ref 31.5–35.7)
MCV: 92 fL (ref 79–97)
Monocytes Absolute: 0.3 10*3/uL (ref 0.1–0.9)
Monocytes: 9 %
Neutrophils Absolute: 1.8 10*3/uL (ref 1.4–7.0)
Neutrophils: 49 %
Platelets: 235 10*3/uL (ref 150–450)
RBC: 4.03 x10E6/uL — ABNORMAL LOW (ref 4.14–5.80)
RDW: 14.3 % (ref 11.6–15.4)
WBC: 3.6 10*3/uL (ref 3.4–10.8)

## 2022-03-13 LAB — CMP14+EGFR
ALT: 21 IU/L (ref 0–44)
AST: 21 IU/L (ref 0–40)
Albumin/Globulin Ratio: 1.9 (ref 1.2–2.2)
Albumin: 4.4 g/dL (ref 3.8–4.8)
Alkaline Phosphatase: 66 IU/L (ref 44–121)
BUN/Creatinine Ratio: 19 (ref 10–24)
BUN: 20 mg/dL (ref 8–27)
Bilirubin Total: 0.3 mg/dL (ref 0.0–1.2)
CO2: 20 mmol/L (ref 20–29)
Calcium: 9.8 mg/dL (ref 8.6–10.2)
Chloride: 107 mmol/L — ABNORMAL HIGH (ref 96–106)
Creatinine, Ser: 1.06 mg/dL (ref 0.76–1.27)
Globulin, Total: 2.3 g/dL (ref 1.5–4.5)
Glucose: 120 mg/dL — ABNORMAL HIGH (ref 70–99)
Potassium: 4.4 mmol/L (ref 3.5–5.2)
Sodium: 142 mmol/L (ref 134–144)
Total Protein: 6.7 g/dL (ref 6.0–8.5)
eGFR: 77 mL/min/{1.73_m2} (ref >59)

## 2022-03-13 LAB — LIPID PANEL
Chol/HDL Ratio: 3.3 ratio (ref 0.0–5.0)
Cholesterol, Total: 129 mg/dL (ref 100–199)
HDL: 39 mg/dL — ABNORMAL LOW (ref >39)
LDL Chol Calc (NIH): 73 mg/dL (ref 0–99)
Triglycerides: 89 mg/dL (ref 0–149)
VLDL Cholesterol Cal: 17 mg/dL (ref 5–40)

## 2022-03-13 LAB — MICROALBUMIN / CREATININE URINE RATIO
Creatinine, Urine: 178.6 mg/dL
Microalb/Creat Ratio: 15 mg/g creat (ref 0–29)
Microalbumin, Urine: 26.4 ug/mL

## 2022-03-13 LAB — HEPATITIS C ANTIBODY: Hep C Virus Ab: NONREACTIVE

## 2022-04-29 NOTE — Progress Notes (Addendum)
Cardiology Office Note   Date:  05/31/2022   ID:  Jack Cherry, DOB 1956/11/02, MRN [id number]  PCP:  Gwenlyn Perking, FNP  Cardiologist:   None Referring:  Gwenlyn Perking, FNP  Chief Complaint  Patient presents with   Heart Murmur      History of Present Illness: Jack Cherry is a 66 y.o. male who was referred by Gwenlyn Perking, FNP for evaluation of a murmur.  He has had a murmur apparently for years I did see an echo from 2018.  He had some mild LVH with normal left ventricular function and a thickened aortic valve with mild stenosis.    Since I last saw him he has done well.  The patient denies any new symptoms such as chest discomfort, neck or arm discomfort. There has been no new shortness of breath, PND or orthopnea. There have been no reported palpitations, presyncope or syncope.  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   Past Medical History:  Diagnosis Date   Amputation finger    partial- end of 2 left fingers following lawnmover incident   Diabetes ([place])    High cholesterol    Hypertension     Past Surgical History:  Procedure Laterality Date   COLONOSCOPY N/A 01/08/2018   Dr. Oneida Alar three 3-8 mm pol;yps, simple adenomas. internal hemorrhoids. next colonoscopy in 3 years   ESOPHAGOGASTRODUODENOSCOPY N/A 01/08/2018   Dr. Oneida Alar: normal esophagus, mild gastritis/duodenitis, H.pylori   GIVENS CAPSULE STUDY N/A 12/21/2018   Procedure: GIVENS CAPSULE STUDY;  Surgeon: Danie Binder, MD;  [facility];  Service: Endoscopy;  Laterality: N/A;  7:30am   HERNIA REPAIR       Current Outpatient Medications  Medication Sig Dispense Refill   amLODipine (NORVASC) 10 MG tablet Take 1 tablet (10 mg total) by mouth daily. 90 tablet 3   atorvastatin (LIPITOR) 40 MG tablet Take 1 tablet (40 mg total) by mouth daily. 90 tablet 3   Ferrous Sulfate (IRON PO) Take 36 mg by mouth daily.     metFORMIN (GLUCOPHAGE XR) 500 MG 24 hr  tablet Take 2 tablets (1,000 mg total) by mouth daily with breakfast. 90 tablet 3   losartan (COZAAR) 100 MG tablet Take 1 tablet (100 mg total) by mouth daily. 90 tablet 3   No current facility-administered medications for this visit.    Allergies:   Patient has no known allergies.    ROS:  Please see the history of present illness.   Otherwise, review of systems are positive for none.   All other systems are reviewed and negative.    PHYSICAL EXAM: VS:  BP (!) 149/90   Pulse 88   Ht '6\' 1"'$  (1.854 m)   Wt 258 lb (117 kg)   SpO2 95%   BMI 34.04 kg/m  , BMI Body mass index is 34.04 kg/m. GENERAL:  Well appearing NECK:  No jugular venous distention, waveform within normal limits, carotid upstroke brisk and symmetric, no bruits, no thyromegaly LUNGS:  Clear to auscultation bilaterally CHEST:  Unremarkable HEART:  PMI not displaced or sustained,S1 and S2 within normal limits, no S3, no S4, no clicks, no rubs, 3 out of 6 apical systolic murmur radiating at the aortic outflow tract, early to mid peaking, no diastolic murmurs ABD:  Flat, positive bowel sounds normal in frequency in pitch, no bruits, no rebound, no guarding, no midline pulsatile mass, no hepatomegaly, no splenomegaly EXT:  2 plus pulses  throughout, no edema, no cyanosis no clubbing   EKG:  EKG is  ordered today. The ekg ordered today demonstrates probable ectopic atrial rhythm with a rate around 100.  This could actually represent flutter.  There is intermittent sinus.    Recent Labs: 03/12/2022: ALT 21; BUN 20; Creatinine, Ser 1.06; Hemoglobin 12.5; Platelets 235; Potassium 4.4; Sodium 142    Lipid Panel    Component Value Date/Time   CHOL 129 03/12/2022 0808   TRIG 89 03/12/2022 0808   HDL 39 (L) 03/12/2022 0808   CHOLHDL 3.3 03/12/2022 0808   CHOLHDL 4.5 02/14/2020 1059   VLDL 27 02/14/2020 1059   LDLCALC 73 03/12/2022 0808      Wt Readings from Last 3 Encounters:  04/30/22 258 lb (117 kg)  03/12/22 256  lb 8 oz (116.3 kg)  12/13/21 260 lb (117.9 kg)      Other studies Reviewed: Additional studies/ records that were reviewed today include: Labs. Review of the above records demonstrates:  Please see elsewhere in the note.     ASSESSMENT AND PLAN:  MURMUR:   He has some mild aortic stenosis.  He is overdue for an echocardiogram and I will order this.  DM: A1c is 6.5 which is down from 7.3. I will defer to Gwenlyn Perking, FNP  HTN:    His blood pressure is not at target.  I am going to increase his Cozaar to 100 mg daily.   ECTOPY: EKG is as above.  He does not feel this.  I am going to follow-up with a 3-day ZIO.   Current medicines are reviewed at length with the patient today.  The patient does not have concerns regarding medicines.  The following changes have been made: As above  Labs/ tests ordered today include:   Orders Placed This Encounter  Procedures   LONG TERM MONITOR (3-14 DAYS)   EKG 12-Lead   ECHOCARDIOGRAM COMPLETE     Disposition:   FU with me after the patch and the echocardiogram.   Signed, Minus Breeding, MD  05/31/2022 2:53 PM    [place]

## 2022-04-30 ENCOUNTER — Ambulatory Visit (INDEPENDENT_AMBULATORY_CARE_PROVIDER_SITE_OTHER): Payer: Medicare Other

## 2022-04-30 ENCOUNTER — Ambulatory Visit (INDEPENDENT_AMBULATORY_CARE_PROVIDER_SITE_OTHER): Payer: Medicare Other | Admitting: Cardiology

## 2022-04-30 ENCOUNTER — Encounter: Payer: Self-pay | Admitting: Cardiology

## 2022-04-30 VITALS — BP 149/90 | HR 88 | Ht 73.0 in | Wt 258.0 lb

## 2022-04-30 DIAGNOSIS — I499 Cardiac arrhythmia, unspecified: Secondary | ICD-10-CM

## 2022-04-30 DIAGNOSIS — I152 Hypertension secondary to endocrine disorders: Secondary | ICD-10-CM

## 2022-04-30 DIAGNOSIS — E1159 Type 2 diabetes mellitus with other circulatory complications: Secondary | ICD-10-CM | POA: Diagnosis not present

## 2022-04-30 DIAGNOSIS — R002 Palpitations: Secondary | ICD-10-CM

## 2022-04-30 DIAGNOSIS — I350 Nonrheumatic aortic (valve) stenosis: Secondary | ICD-10-CM

## 2022-04-30 DIAGNOSIS — R9431 Abnormal electrocardiogram [ECG] [EKG]: Secondary | ICD-10-CM

## 2022-04-30 DIAGNOSIS — I10 Essential (primary) hypertension: Secondary | ICD-10-CM

## 2022-04-30 DIAGNOSIS — E118 Type 2 diabetes mellitus with unspecified complications: Secondary | ICD-10-CM | POA: Diagnosis not present

## 2022-04-30 MED ORDER — LOSARTAN POTASSIUM 100 MG PO TABS: 100.0000 mg | ORAL_TABLET | Freq: Every day | ORAL | 3 refills | Status: DC

## 2022-04-30 NOTE — Patient Instructions (Addendum)
Medication Instructions:  Please increase Losartan to 100 mg a day. Continue all other medications as listed.  *If you need a refill on your cardiac medications before your next appointment, please call your pharmacy*   Testing/Procedures: Your physician has requested that you have an echocardiogram. Echocardiography is a painless test that uses sound waves to create images of your heart. It provides your doctor with information about the size and shape of your heart and how well your heart's chambers and valves are working. This procedure takes approximately one hour. There are no restrictions for this procedure. This will be completed at [facility].  You will be contacted to be scheduled.  ZIO XT- Long Term Monitor Instructions  Your physician has requested you wear a ZIO patch monitor for 14 days.  This is a single patch monitor. [employer] supplies one patch monitor per enrollment. Additional stickers are not available. Please do not apply patch if you will be having a Nuclear Stress Test,  Echocardiogram, Cardiac CT, MRI, or Chest Xray during the period you would be wearing the  monitor. The patch cannot be worn during these tests. You cannot remove and re-apply the  ZIO XT patch monitor.  Your ZIO patch monitor will be mailed 3 day USPS to your address on file. It may take 3-5 days  to receive your monitor after you have been enrolled.  Once you have received your monitor, please review the enclosed instructions. Your monitor  has already been registered assigning a specific monitor serial # to you.  Billing and Patient Assistance Program Information  We have supplied [employer] with any of your insurance information on file for billing purposes. [employer] offers a sliding scale Patient Assistance Program for patients that do not have  insurance, or whose insurance does not completely cover the cost of the ZIO monitor.  You must apply for the Patient Assistance Program to qualify for this  discounted rate.  To apply, please call [employer] at [phone], select option 4, select option 2, ask to apply for  Patient Assistance Program. Theodore Demark will ask your household income, and how many people  are in your household. They will quote your out-of-pocket cost based on that information.  [employer] will also be able to set up a 78-month interest-free payment plan if needed.  Applying the monitor   Shave hair from upper left chest.  Hold abrader disc by orange tab. Rub abrader in 40 strokes over the upper left chest as  indicated in your monitor instructions.  Clean area with 4 enclosed alcohol pads. Let dry.  Apply patch as indicated in monitor instructions. Patch will be placed under collarbone on left  side of chest with arrow pointing upward.  Rub patch adhesive wings for 2 minutes. Remove white label marked "1". Remove the white  label marked "2". Rub patch adhesive wings for 2 additional minutes.  While looking in a mirror, press and release button in center of patch. A small green light will  flash 3-4 times. This will be your only indicator that the monitor has been turned on.  Do not shower for the first 24 hours. You may shower after the first 24 hours.  Press the button if you feel a symptom. You will hear a small click. Record Date, Time and  Symptom in the Patient Logbook.  When you are ready to remove the patch, follow instructions on the last 2 pages of Patient  Logbook. Stick patch monitor onto the last page of Patient Logbook.  Place Patient Logbook in the blue and white box. Use locking tab on box and tape box closed  securely. The blue and white box has prepaid postage on it. Please place it in the mailbox as  soon as possible. Your physician should have your test results approximately 7 days after the  monitor has been mailed back to [facility].  Call [facility] at [phone] if you have questions regarding  your ZIO XT patch monitor. Call  them immediately if you see an orange light blinking on your  monitor.  If your monitor falls off in less than 4 days, contact our Monitor department at [phone].  If your monitor becomes loose or falls off after 4 days call [employer] at [phone] for  suggestions on securing your monitor   Follow-Up: At [facility], you and your health needs are our priority.  As part of our continuing mission to provide you with exceptional heart care, we have created designated Provider Care Teams.  These Care Teams include your primary Cardiologist (physician) and Advanced Practice Providers (APPs -  Physician Assistants and Nurse Practitioners) who all work together to provide you with the care you need, when you need it.  We recommend signing up for the patient portal called "MyChart".  Sign up information is provided on this After Visit Summary.  MyChart is used to connect with patients for Virtual Visits (Telemedicine).  Patients are able to view lab/test results, encounter notes, upcoming appointments, etc.  Non-urgent messages can be sent to your provider as well.   To learn more about what you can do with MyChart, go to [email or web address].    Your next appointment:   1 year(s)  The format for your next appointment:   In Person  Provider:   Minus Breeding, MD{   Important Information About [employer]

## 2022-04-30 NOTE — Progress Notes (Unsigned)
Enrolled for [employer] to mail a ZIO XT long term holter monitor to the patients address on file.  

## 2022-05-04 DIAGNOSIS — R002 Palpitations: Secondary | ICD-10-CM | POA: Diagnosis not present

## 2022-05-04 DIAGNOSIS — R9431 Abnormal electrocardiogram [ECG] [EKG]: Secondary | ICD-10-CM

## 2022-05-15 ENCOUNTER — Ambulatory Visit (HOSPITAL_COMMUNITY)
Admission: RE | Admit: 2022-05-15 | Discharge: 2022-05-15 | Disposition: A | Discharge status: Home / Self Care | Payer: Medicare Other | Source: Ambulatory Visit | Attending: Cardiology | Admitting: Cardiology

## 2022-05-15 DIAGNOSIS — I10 Essential (primary) hypertension: Secondary | ICD-10-CM | POA: Diagnosis present

## 2022-05-15 DIAGNOSIS — I350 Nonrheumatic aortic (valve) stenosis: Secondary | ICD-10-CM | POA: Diagnosis present

## 2022-05-15 LAB — ECHOCARDIOGRAM COMPLETE
AR max vel: 0.77 cm2
AV Area VTI: 0.78 cm2
AV Area mean vel: 0.83 cm2
AV Mean grad: 29 mmHg
AV Peak grad: 51.6 mmHg
Ao pk vel: 3.59 m/s
Area-P 1/2: 2.87 cm2
S' Lateral: 2.7 cm

## 2022-05-15 NOTE — Progress Notes (Signed)
*  PRELIMINARY RESULTS* Echocardiogram 2D Echocardiogram has been performed.  Jack Cherry 05/15/2022, 1:59 PM

## 2022-05-21 ENCOUNTER — Encounter: Payer: Self-pay | Admitting: *Deleted

## 2022-06-03 ENCOUNTER — Encounter: Payer: Self-pay | Admitting: *Deleted

## 2022-06-12 ENCOUNTER — Ambulatory Visit: Payer: Self-pay | Admitting: *Deleted

## 2022-06-12 ENCOUNTER — Encounter: Payer: Self-pay | Admitting: Family Medicine

## 2022-06-12 ENCOUNTER — Ambulatory Visit: Payer: Medicare Other | Admitting: Family Medicine

## 2022-06-12 NOTE — Patient Instructions (Signed)
Arley [place]  At some point during the past 4 years, I have worked with you through the [place] Management Program (CCM) at [facility]. We have not worked together within the past 6 months.   Due to program changes I am removing myself from your care team.   If you are currently active with another CCM Team Member, you will remain active with them unless they reach out to you with additional information.   If you feel that you need services in the future,  please talk with your primary care provider and request a new referral for Care Management or Care Coordination services. This does not affect your status as a patient at [facility].   Thank you for allowing me to participate in your your healthcare journey.  Chong Sicilian, BSN, RN-BC Proofreader Dial: [phone]

## 2022-06-12 NOTE — Chronic Care Management (AMB) (Signed)
  Chronic Care Management   Note  06/12/2022 Name: Jack Cherry MRN: [id number] DOB: 08/07/1956   Due to changes in the Chronic Care Management program, I am removing myself as the [place] from the Care Team and closing any RN Care Management Care Plans. The patient has not worked with the Consulting civil engineer within the past 6 months. Patient was not scheduled to be followed by the RN Care Coordination nurse for [facility].   Patient does not have an open Care Plan with another CCM team member. Patient does not have a current CCM referral placed since 03/03/22. CCM enrollment status changed to "not enrolled".   Patient's PCP can place a new referral if the they needs Care Management or Care Coordination services in the future.  Chong Sicilian, BSN, RN-BC Proofreader Dial: [phone]

## 2022-06-19 ENCOUNTER — Ambulatory Visit (INDEPENDENT_AMBULATORY_CARE_PROVIDER_SITE_OTHER): Payer: Medicare Other | Admitting: Family Medicine

## 2022-06-19 ENCOUNTER — Encounter: Payer: Self-pay | Admitting: Family Medicine

## 2022-06-19 VITALS — BP 120/64 | HR 80 | Temp 98.5°F | Wt 257.0 lb

## 2022-06-19 DIAGNOSIS — R351 Nocturia: Secondary | ICD-10-CM

## 2022-06-19 DIAGNOSIS — E1165 Type 2 diabetes mellitus with hyperglycemia: Secondary | ICD-10-CM

## 2022-06-19 DIAGNOSIS — I152 Hypertension secondary to endocrine disorders: Secondary | ICD-10-CM

## 2022-06-19 DIAGNOSIS — N401 Enlarged prostate with lower urinary tract symptoms: Secondary | ICD-10-CM

## 2022-06-19 DIAGNOSIS — I350 Nonrheumatic aortic (valve) stenosis: Secondary | ICD-10-CM

## 2022-06-19 DIAGNOSIS — E1169 Type 2 diabetes mellitus with other specified complication: Secondary | ICD-10-CM | POA: Diagnosis not present

## 2022-06-19 DIAGNOSIS — Z6833 Body mass index (BMI) 33.0-33.9, adult: Secondary | ICD-10-CM

## 2022-06-19 DIAGNOSIS — E6609 Other obesity due to excess calories: Secondary | ICD-10-CM

## 2022-06-19 DIAGNOSIS — E1159 Type 2 diabetes mellitus with other circulatory complications: Secondary | ICD-10-CM | POA: Diagnosis not present

## 2022-06-19 DIAGNOSIS — E785 Hyperlipidemia, unspecified: Secondary | ICD-10-CM

## 2022-06-19 LAB — CBC WITH DIFFERENTIAL/PLATELET
Basophils Absolute: 0 10*3/uL (ref 0.0–0.2)
Basos: 1 %
EOS (ABSOLUTE): 0.1 10*3/uL (ref 0.0–0.4)
Eos: 1 %
Hematocrit: 38.2 % (ref 37.5–51.0)
Hemoglobin: 12.9 g/dL — ABNORMAL LOW (ref 13.0–17.7)
Immature Grans (Abs): 0 10*3/uL (ref 0.0–0.1)
Immature Granulocytes: 1 %
Lymphocytes Absolute: 1.7 10*3/uL (ref 0.7–3.1)
Lymphs: 34 %
MCH: 30.7 pg (ref 26.6–33.0)
MCHC: 33.8 g/dL (ref 31.5–35.7)
MCV: 91 fL (ref 79–97)
Monocytes Absolute: 0.5 10*3/uL (ref 0.1–0.9)
Monocytes: 10 %
Neutrophils Absolute: 2.6 10*3/uL (ref 1.4–7.0)
Neutrophils: 53 %
Platelets: 251 10*3/uL (ref 150–450)
RBC: 4.2 x10E6/uL (ref 4.14–5.80)
RDW: 13.5 % (ref 11.6–15.4)
WBC: 4.9 10*3/uL (ref 3.4–10.8)

## 2022-06-19 LAB — CMP14+EGFR
ALT: 19 IU/L (ref 0–44)
AST: 20 IU/L (ref 0–40)
Albumin/Globulin Ratio: 1.8 (ref 1.2–2.2)
Albumin: 4.7 g/dL (ref 3.9–4.9)
Alkaline Phosphatase: 76 IU/L (ref 44–121)
BUN/Creatinine Ratio: 16 (ref 10–24)
BUN: 23 mg/dL (ref 8–27)
Bilirubin Total: 0.4 mg/dL (ref 0.0–1.2)
CO2: 20 mmol/L (ref 20–29)
Calcium: 9.9 mg/dL (ref 8.6–10.2)
Chloride: 103 mmol/L (ref 96–106)
Creatinine, Ser: 1.4 mg/dL — ABNORMAL HIGH (ref 0.76–1.27)
Globulin, Total: 2.6 g/dL (ref 1.5–4.5)
Glucose: 148 mg/dL — ABNORMAL HIGH (ref 70–99)
Potassium: 3.7 mmol/L (ref 3.5–5.2)
Sodium: 141 mmol/L (ref 134–144)
Total Protein: 7.3 g/dL (ref 6.0–8.5)
eGFR: 55 mL/min/{1.73_m2} — ABNORMAL LOW (ref >59)

## 2022-06-19 LAB — LIPID PANEL
Chol/HDL Ratio: 3.5 ratio (ref 0.0–5.0)
Cholesterol, Total: 140 mg/dL (ref 100–199)
HDL: 40 mg/dL (ref >39)
LDL Chol Calc (NIH): 81 mg/dL (ref 0–99)
Triglycerides: 102 mg/dL (ref 0–149)
VLDL Cholesterol Cal: 19 mg/dL (ref 5–40)

## 2022-06-19 LAB — BAYER DCA HB A1C WAIVED: HB A1C (BAYER DCA - WAIVED): 6.7 % — ABNORMAL HIGH (ref 4.8–5.6)

## 2022-06-19 MED ORDER — TAMSULOSIN HCL 0.4 MG PO CAPS: 0.4000 mg | ORAL_CAPSULE | Freq: Every day | ORAL | 3 refills | Status: DC

## 2022-06-19 NOTE — Progress Notes (Signed)
Established Patient Office Visit  Subjective   Patient ID: Jack Cherry, male    DOB: 1955-12-21  Age: 65 y.o. MRN: [id number]  Chief Complaint  Patient presents with   Medical Management of Chronic Issues   Diabetes   Hyperlipidemia   Hypertension    Diabetes He presents for his follow-up diabetic visit. He has type 2 diabetes mellitus. His disease course has been stable. Pertinent negatives for hypoglycemia include no dizziness, headaches, seizures or sweats. Associated symptoms include polyuria (7-8x at night). Pertinent negatives for diabetes include no blurred vision, no chest pain, no fatigue, no foot paresthesias, no foot ulcerations, no polydipsia, no polyphagia, no visual change, no weakness and no weight loss. Symptoms are stable. Risk factors for coronary artery disease include dyslipidemia, male sex, hypertension and obesity. Current diabetic treatment includes oral agent (monotherapy). He is compliant with treatment all of the time. An ACE inhibitor/angiotensin II receptor blocker is being taken. Eye exam is not current (upcoming).  Hyperlipidemia This is a chronic problem. Exacerbating diseases include diabetes and obesity. Pertinent negatives include no chest pain, focal weakness, myalgias or shortness of breath. Current antihyperlipidemic treatment includes statins. Compliance problems include adherence to diet and adherence to exercise.  Risk factors for coronary artery disease include hypertension, male sex, obesity and diabetes mellitus.  Hypertension The problem is controlled. Pertinent negatives include no anxiety, blurred vision, chest pain, headaches, malaise/fatigue, neck pain, orthopnea, palpitations, peripheral edema, PND, shortness of breath or sweats. Risk factors for coronary artery disease include diabetes mellitus, dyslipidemia, male gender and obesity. Past treatments include angiotensin blockers. Compliance problems include diet and exercise.       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  Eyes:  Negative for blurred vision, double vision and photophobia.  Respiratory:  Negative for shortness of breath and wheezing.   Cardiovascular:  Negative for chest pain, palpitations, orthopnea, claudication, leg swelling and PND.  Gastrointestinal:  Negative for abdominal pain, nausea and vomiting.  Genitourinary:  Positive for frequency and urgency. Negative for dysuria, flank pain and hematuria.  Musculoskeletal:  Negative for myalgias and neck pain.  Neurological:  Negative for dizziness, tingling, focal weakness, seizures, loss of consciousness, weakness and headaches.  Endo/Heme/Allergies:  Negative for polydipsia and polyphagia.      Objective:     BP 120/64   Pulse 80   Temp 98.5 F (36.9 C) (Temporal)   Wt 257 lb (116.6 kg)   SpO2 96%   BMI 33.91 kg/m  BP Readings from Last 3 Encounters:  06/19/22 120/64  04/30/22 (!) 149/90  03/12/22 132/77      Physical Exam Vitals and nursing note reviewed.  Constitutional:      General: He is not in acute distress.    Appearance: He is not ill-appearing, toxic-appearing or diaphoretic.  HENT:     Head: Normocephalic and atraumatic.     Mouth/Throat:     Mouth: Mucous membranes are moist.     Pharynx: Oropharynx is clear.  Neck:     Vascular: No carotid bruit.  Cardiovascular:     Rate and Rhythm: Normal rate and regular rhythm.     Heart sounds: Murmur heard.     No friction rub. No gallop.  Pulmonary:     Effort: Pulmonary effort is normal. No respiratory distress.     Breath sounds: Normal breath sounds. No wheezing, rhonchi or rales.  Chest:     Chest wall: No tenderness.  Abdominal:     General: Bowel sounds are normal.  There is no distension.     Palpations: Abdomen is soft.     Tenderness: There is no abdominal tenderness. There is no guarding or rebound.  Musculoskeletal:        General: Normal range of motion.     Cervical back: No rigidity.     Right lower leg: No edema.     Left lower leg: No edema.  Lymphadenopathy:     Cervical: No cervical adenopathy.  Skin:    General: Skin is warm and dry.  Neurological:     General: No focal deficit present.     Mental Status: He is alert and oriented to person, place, and time.  Psychiatric:        Mood and Affect: Mood normal.        Behavior: Behavior normal.      No results found for any visits on 06/19/22.  Last CBC Lab Results  Component Value Date   WBC 3.6 03/12/2022   HGB 12.5 (L) 03/12/2022   HCT 37.0 (L) 03/12/2022   MCV 92 03/12/2022   MCH 31.0 03/12/2022   RDW 14.3 03/12/2022   PLT 235 53/29/9242   Last metabolic panel Lab Results  Component Value Date   GLUCOSE 120 (H) 03/12/2022   NA 142 03/12/2022   K 4.4 03/12/2022   CL 107 (H) 03/12/2022   CO2 20 03/12/2022   BUN 20 03/12/2022   CREATININE 1.06 03/12/2022   EGFR 77 03/12/2022   CALCIUM 9.8 03/12/2022   PROT 6.7 03/12/2022   ALBUMIN 4.4 03/12/2022   LABGLOB 2.3 03/12/2022   AGRATIO 1.9 03/12/2022   BILITOT 0.3 03/12/2022   ALKPHOS 66 03/12/2022   AST 21 03/12/2022   ALT 21 03/12/2022   ANIONGAP 8 02/14/2020   Last lipids Lab Results  Component Value Date   CHOL 129 03/12/2022   HDL 39 (L) 03/12/2022   LDLCALC 73 03/12/2022   TRIG 89 03/12/2022   CHOLHDL 3.3 03/12/2022      The ASCVD Risk score (Arnett DK, et al., 2019) failed to calculate for the following reasons:   The valid total cholesterol range is 130 to 320 mg/dL    Assessment & Plan:   Jack Cherry was seen today for medical management of chronic issues, diabetes, hyperlipidemia and hypertension.  Diagnoses and all orders for this visit:  Type 2 diabetes mellitus with hyperglycemia, without  long-term current use of insulin (HCC) A1c is 6.7 today. At goal of <7. Continue metformin. On ARB and statin. Eye exam scheduled.  -     Bayer DCA Hb A1c Waived  Hypertension associated with diabetes ([place]) Well controlled on current regimen. Labs pending. Continue losartan.  -     CBC with Differential/Platelet -     CMP14+EGFR  Hyperlipidemia associated with type 2 diabetes mellitus ([place]) On statin. Labs pending.  -     Lipid panel  Class 1 obesity due to excess calories with serious comorbidity and body mass index (BMI) of 33.0 to 33.9 in adult Diet and exercise.   Nonrheumatic aortic valve stenosis Manage by cardiology. Asymptomatic.   Benign prostatic hyperplasia with nocturia Will check PSA today. Try flomax.  -     tamsulosin (FLOMAX)  0.4 MG CAPS capsule; Take 1 capsule (0.4 mg total) by mouth daily. -     PSA, total and free  Return in about 6 months (around 12/20/2022) for chronic follow up.   The patient indicates understanding of these issues and agrees with the plan.  Gwenlyn Perking, FNP

## 2022-06-19 NOTE — Patient Instructions (Addendum)
Come to [facility] on 07/17/22 at 10:00 for eye exam.   Get TDAP and Shingrix shots at the pharmacy.   Pick up tamsulosin at nighttime.

## 2022-06-20 ENCOUNTER — Other Ambulatory Visit: Payer: Self-pay | Admitting: Family Medicine

## 2022-06-20 DIAGNOSIS — N1831 Chronic kidney disease, stage 3a: Secondary | ICD-10-CM

## 2022-06-20 DIAGNOSIS — E1169 Type 2 diabetes mellitus with other specified complication: Secondary | ICD-10-CM

## 2022-06-20 DIAGNOSIS — E1165 Type 2 diabetes mellitus with hyperglycemia: Secondary | ICD-10-CM

## 2022-06-20 MED ORDER — DAPAGLIFLOZIN PROPANEDIOL 10 MG PO TABS: 10.0000 mg | ORAL_TABLET | Freq: Every day | ORAL | 3 refills | Status: DC

## 2022-06-20 MED ORDER — ATORVASTATIN CALCIUM 80 MG PO TABS: 80.0000 mg | ORAL_TABLET | Freq: Every day | ORAL | 3 refills | Status: DC

## 2022-06-21 LAB — PSA, TOTAL AND FREE
PSA, Free Pct: 20 %
PSA, Free: 0.72 ng/mL
Prostate Specific Ag, Serum: 3.6 ng/mL (ref 0.0–4.0)

## 2022-07-01 DIAGNOSIS — I499 Cardiac arrhythmia, unspecified: Secondary | ICD-10-CM | POA: Insufficient documentation

## 2022-07-01 NOTE — Progress Notes (Deleted)
Cardiology Office Note   Date:  07/01/2022   ID:  Jack Cherry, DOB Dec 14, 1955, MRN [id number]  PCP:  Gwenlyn Perking, FNP  Cardiologist:   None Referring:  Gwenlyn Perking, FNP  No chief complaint on file.     History of Present Illness: Jack Cherry is a 66 y.o. male who was referred by Gwenlyn Perking, FNP for evaluation of a murmur.  He has had a murmur apparently for years I did see an echo from 2018.  He had some mild LVH with normal left ventricular function and a thickened aortic valve with mild stenosis.    Since I last saw him he had an echo with mild AS.   He had some SVT brief runs on a monitor.  ***   ***  he has done well.  The patient denies any new symptoms such as chest discomfort, neck or arm discomfort. There has been no new shortness of breath, PND or orthopnea. There have been no reported palpitations, presyncope or syncope.  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 Oneida Alar three 3-8 mm pol;yps, simple adenomas. internal hemorrhoids. next colonoscopy in 3 years   ESOPHAGOGASTRODUODENOSCOPY N/A 01/08/2018   Dr. Oneida Alar: normal esophagus, mild gastritis/duodenitis, H.pylori   GIVENS CAPSULE STUDY N/A 12/21/2018   Procedure: GIVENS CAPSULE STUDY;  Surgeon: Danie Binder, MD;  [facility];  Service: Endoscopy;  Laterality: N/A;  7:30am   HERNIA REPAIR       Current Outpatient Medications  Medication Sig Dispense Refill   amLODipine (NORVASC) 10 MG tablet Take 1 tablet (10 mg total) by mouth daily. 90 tablet 3   atorvastatin (LIPITOR) 80 MG tablet Take 1 tablet (80 mg total) by mouth daily. 90 tablet 3   dapagliflozin propanediol (FARXIGA) 10  MG TABS tablet Take 1 tablet (10 mg total) by mouth daily before breakfast. 90 tablet 3   Ferrous Sulfate (IRON PO) Take 36 mg by mouth daily.     losartan (COZAAR) 100 MG tablet Take 1 tablet (100 mg total) by mouth daily. 90 tablet 3   metFORMIN (GLUCOPHAGE-XR) 500 MG 24 hr tablet TAKE ONE TABLET ONCE DAILY WITH BREAKFAST 90 tablet 1   tamsulosin (FLOMAX) 0.4 MG CAPS capsule Take 1 capsule (0.4 mg total) by mouth daily. 90 capsule 3   No current facility-administered medications for this visit.    Allergies:   Patient has no known allergies.    ROS:  Please see the history of present illness.   Otherwise, review of systems are positive for ***.   All other systems are reviewed and negative.    PHYSICAL EXAM: VS:  There were no vitals taken for this visit. , BMI There is no height or weight on file to calculate BMI. GENERAL:  Well appearing NECK:  No jugular venous distention, waveform within normal limits, carotid upstroke brisk and symmetric, no bruits, no thyromegaly LUNGS:  Clear to auscultation bilaterally CHEST:  Unremarkable HEART:  PMI not displaced or sustained,S1 and S2 within normal limits, no S3, no S4, no clicks, no rubs, *** murmurs ABD:  Flat, positive bowel sounds normal in  frequency in pitch, no bruits, no rebound, no guarding, no midline pulsatile mass, no hepatomegaly, no splenomegaly EXT:  2 plus pulses throughout, no edema, no cyanosis no clubbing     ***GENERAL:  Well appearing NECK:  No jugular venous distention, waveform within normal limits, carotid upstroke brisk and symmetric, no bruits, no thyromegaly LUNGS:  Clear to auscultation bilaterally CHEST:  Unremarkable HEART:  PMI not displaced or sustained,S1 and S2 within normal limits, no S3, no S4, no clicks, no rubs, 3 out of 6 apical systolic murmur radiating at the aortic outflow tract, early to mid peaking, no diastolic murmurs ABD:  Flat, positive bowel sounds normal in frequency in pitch, no bruits,  no rebound, no guarding, no midline pulsatile mass, no hepatomegaly, no splenomegaly EXT:  2 plus pulses throughout, no edema, no cyanosis no clubbing   EKG:  EKG is *** ordered today. The ekg ordered today demonstrates ***  Recent Labs: 06/19/2022: ALT 19; BUN 23; Creatinine, Ser 1.40; Hemoglobin 12.9; Platelets 251; Potassium 3.7; Sodium 141    Lipid Panel    Component Value Date/Time   CHOL 140 06/19/2022 0841   TRIG 102 06/19/2022 0841   HDL 40 06/19/2022 0841   CHOLHDL 3.5 06/19/2022 0841   CHOLHDL 4.5 02/14/2020 1059   VLDL 27 02/14/2020 1059   LDLCALC 81 06/19/2022 0841      Wt Readings from Last 3 Encounters:  06/19/22 257 lb (116.6 kg)  04/30/22 258 lb (117 kg)  03/12/22 256 lb 8 oz (116.3 kg)      Other studies Reviewed: Additional studies/ records that were reviewed today include: ***. Review of the above records demonstrates:  Please see elsewhere in the note.     ASSESSMENT AND PLAN:  AORTIC STENOSIS:  This was mild on echo in July.  ***    He has some mild aortic stenosis.  He is overdue for an echocardiogram and I will order this.  DM: A1c is ***  .5 which is down from 7.3. I will defer to Gwenlyn Perking, FNP  HTN:    His blood pressure is *** not at target.  I am going to increase his Cozaar to 100 mg daily.   ECTOPY:   Brief SVT was noted on monitor.  ***  EKG is as above.  He does not feel this.  I am going to follow-up with a 3-day ZIO.   Current medicines are reviewed at length with the patient today.  The patient does not have concerns regarding medicines.  The following changes have been made:   ***  Labs/ tests ordered today include:  ***  No orders of the defined types were placed in this encounter.    Disposition:   FU with me ***   Signed, Minus Breeding, MD  07/01/2022 11:15 AM    [place]

## 2022-07-02 ENCOUNTER — Ambulatory Visit: Payer: Medicare Other | Admitting: Cardiology

## 2022-07-02 DIAGNOSIS — I499 Cardiac arrhythmia, unspecified: Secondary | ICD-10-CM

## 2022-07-02 DIAGNOSIS — I10 Essential (primary) hypertension: Secondary | ICD-10-CM

## 2022-07-02 DIAGNOSIS — E118 Type 2 diabetes mellitus with unspecified complications: Secondary | ICD-10-CM

## 2022-07-02 DIAGNOSIS — I350 Nonrheumatic aortic (valve) stenosis: Secondary | ICD-10-CM

## 2022-07-17 ENCOUNTER — Ambulatory Visit: Payer: Medicare Other

## 2022-10-31 ENCOUNTER — Other Ambulatory Visit: Payer: Self-pay | Admitting: Family Medicine

## 2022-10-31 DIAGNOSIS — E1159 Type 2 diabetes mellitus with other circulatory complications: Secondary | ICD-10-CM

## 2022-12-22 ENCOUNTER — Ambulatory Visit (INDEPENDENT_AMBULATORY_CARE_PROVIDER_SITE_OTHER): Payer: Medicare Other | Admitting: Family Medicine

## 2022-12-22 ENCOUNTER — Encounter: Payer: Self-pay | Admitting: Family Medicine

## 2022-12-22 VITALS — BP 144/89 | HR 83 | Temp 98.1°F | Ht 73.0 in | Wt 263.4 lb

## 2022-12-22 DIAGNOSIS — E1169 Type 2 diabetes mellitus with other specified complication: Secondary | ICD-10-CM

## 2022-12-22 DIAGNOSIS — I350 Nonrheumatic aortic (valve) stenosis: Secondary | ICD-10-CM | POA: Diagnosis not present

## 2022-12-22 DIAGNOSIS — N1831 Chronic kidney disease, stage 3a: Secondary | ICD-10-CM

## 2022-12-22 DIAGNOSIS — E1165 Type 2 diabetes mellitus with hyperglycemia: Secondary | ICD-10-CM | POA: Diagnosis not present

## 2022-12-22 DIAGNOSIS — I152 Hypertension secondary to endocrine disorders: Secondary | ICD-10-CM

## 2022-12-22 DIAGNOSIS — E1159 Type 2 diabetes mellitus with other circulatory complications: Secondary | ICD-10-CM

## 2022-12-22 DIAGNOSIS — R399 Unspecified symptoms and signs involving the genitourinary system: Secondary | ICD-10-CM

## 2022-12-22 DIAGNOSIS — E785 Hyperlipidemia, unspecified: Secondary | ICD-10-CM

## 2022-12-22 DIAGNOSIS — R351 Nocturia: Secondary | ICD-10-CM

## 2022-12-22 DIAGNOSIS — Z6835 Body mass index (BMI) 35.0-35.9, adult: Secondary | ICD-10-CM

## 2022-12-22 LAB — CMP14+EGFR
ALT: 22 IU/L (ref 0–44)
AST: 23 IU/L (ref 0–40)
Albumin/Globulin Ratio: 1.8 (ref 1.2–2.2)
Albumin: 4.6 g/dL (ref 3.9–4.9)
Alkaline Phosphatase: 80 IU/L (ref 44–121)
BUN/Creatinine Ratio: 13 (ref 10–24)
BUN: 13 mg/dL (ref 8–27)
Bilirubin Total: 0.3 mg/dL (ref 0.0–1.2)
CO2: 22 mmol/L (ref 20–29)
Calcium: 9.5 mg/dL (ref 8.6–10.2)
Chloride: 102 mmol/L (ref 96–106)
Creatinine, Ser: 0.97 mg/dL (ref 0.76–1.27)
Globulin, Total: 2.5 g/dL (ref 1.5–4.5)
Glucose: 160 mg/dL — ABNORMAL HIGH (ref 70–99)
Potassium: 4.4 mmol/L (ref 3.5–5.2)
Sodium: 139 mmol/L (ref 134–144)
Total Protein: 7.1 g/dL (ref 6.0–8.5)
eGFR: 86 mL/min/{1.73_m2} (ref >59)

## 2022-12-22 LAB — LIPID PANEL
Chol/HDL Ratio: 3.2 ratio (ref 0.0–5.0)
Cholesterol, Total: 132 mg/dL (ref 100–199)
HDL: 41 mg/dL (ref >39)
LDL Chol Calc (NIH): 69 mg/dL (ref 0–99)
Triglycerides: 120 mg/dL (ref 0–149)
VLDL Cholesterol Cal: 22 mg/dL (ref 5–40)

## 2022-12-22 LAB — BAYER DCA HB A1C WAIVED: HB A1C (BAYER DCA - WAIVED): 8.8 % — ABNORMAL HIGH (ref 4.8–5.6)

## 2022-12-22 MED ORDER — METFORMIN HCL ER 500 MG PO TB24: 1000.0000 mg | ORAL_TABLET | Freq: Every day | ORAL | 1 refills | Status: DC

## 2022-12-22 NOTE — Progress Notes (Signed)
Established Patient Office Visit  Subjective:  Patient ID: Jack Cherry, male    DOB: [birth year]  Age: 67 y.o. MRN: [id number]  CC:  Chief Complaint  Patient presents with   Diabetes   Hypertension   Hyperlipidemia    HPI Jack Cherry presents for chronic follow up.  1. DM Patient denies foot ulcerations, paresthesia of the feet, visual disturbances, vomiting and weight loss.  Current diabetic medications include metformin 500 mg daily Compliant with meds - Yes with metformin, didn't start farxiga due to cost  Current monitoring regimen: none Any episodes of hypoglycemia? no  Is He on ACE inhibitor or angiotensin II receptor blocker?  Yes, losartan Is He on statin? Yes lipitor Is He on ASA 81 mg daily?  No  2. HTN Complaint with meds - Yes Current Medications - norvasc, losartan.  Pertinent ROS:  Headache - No Fatigue - No Visual Disturbances - No Chest pain - No Dyspnea - No Palpitations - No LE edema - No  3. Urinary symptoms Chronic. Reports no improvement with flomax. Normal PSA screening. Reports urgency, frequency. Nocurtia with 5-6 episodes of voiding at night.   BP Readings from Last 3 Encounters:  12/22/22 (!) 150/93  06/19/22 120/64  04/30/22 (!) 149/90      Latest Ref Rng & Units 06/19/2022    8:41 AM 03/12/2022    8:08 AM 12/13/2021    8:49 AM  CMP  Glucose 70 - 99 mg/dL 148  120  162   BUN 8 - 27 mg/dL 23  20  15   $ Creatinine 0.76 - 1.27 mg/dL 1.40  1.06  1.11   Sodium 134 - 144 mmol/L 141  142  140   Potassium 3.5 - 5.2 mmol/L 3.7  4.4  4.4   Chloride 96 - 106 mmol/L 103  107  103   CO2 20 - 29 mmol/L 20  20  23   $ Calcium 8.6 - 10.2 mg/dL 9.9  9.8  9.5   Total Protein 6.0 - 8.5 g/dL 7.3  6.7  6.9   Total Bilirubin 0.0 - 1.2 mg/dL 0.4  0.3  0.3   Alkaline Phos 44 - 121 IU/L 76  66  73   AST 0 - 40 IU/L 20  21  24   $ ALT 0 - 44 IU/L 19  21  21       $ Past Medical History:  Diagnosis Date   Amputation finger    partial- end of 2  left fingers following lawnmover incident   Diabetes (HCC)    High cholesterol    Hypertension     Past Surgical History:  Procedure Laterality Date   COLONOSCOPY N/A 01/08/2018   Dr. Oneida Alar three 3-8 mm pol;yps, simple adenomas. internal hemorrhoids. next colonoscopy in 3 years   ESOPHAGOGASTRODUODENOSCOPY N/A 01/08/2018   Dr. Oneida Alar: normal esophagus, mild gastritis/duodenitis, H.pylori   GIVENS CAPSULE STUDY N/A 12/21/2018   Procedure: GIVENS CAPSULE STUDY;  Surgeon: Danie Binder, MD;  [facility];  Service: Endoscopy;  Laterality: N/A;  7:30am   HERNIA REPAIR      Family History  Problem Relation Age of Onset   Cancer Mother        not sure what kind   Heart disease Father 60    Social History   Socioeconomic History   Marital status: Single    Spouse name: Not on file   Number of children: 0   Years of education: 9   Highest education  level: 9th grade  Occupational History   Not on file  Tobacco Use   Smoking status: Never   Smokeless tobacco: Never  Vaping Use   Vaping Use: Never used  Substance and Sexual Activity   Alcohol use: No   Drug use: Not Currently    Comment: last use of Marijuana 20 years ago   Sexual activity: Not Currently  Other Topics Concern   Not on file  Social History Narrative   Lives with nephew   Social Determinants of Health   Financial Resource Strain: Low Risk  (07/10/2021)   Overall Financial Resource Strain (CARDIA)    Difficulty of Paying Living Expenses: Not hard at all  Food Insecurity: No Food Insecurity (12/10/2020)   Hunger Vital Sign    Worried About Running Out of Food in the Last Year: Never true    [place] in the Last Year: Never true  Transportation Needs: No Transportation Needs (12/10/2020)   PRAPARE - Hydrologist (Medical): No    Lack of Transportation (Non-Medical): No  Physical Activity: Inactive (11/17/2018)   Exercise Vital Sign    Days of Exercise per Week: 0  days    Minutes of Exercise per Session: 0 min  Stress: No Stress Concern Present (07/10/2021)   Jack Cherry    Feeling of Stress : Not at all  Social Connections: Moderately Isolated (12/10/2020)   Social Connection and Isolation Panel [NHANES]    Frequency of Communication with Friends and Family: More than three times a week    Frequency of Social Gatherings with Friends and Family: Twice a week    Attends Religious Services: 1 to 4 times per year    Active Member of [employer] or Organizations: No    Attends Archivist Meetings: Never    Marital Status: Never married  Intimate Partner Violence: Not At Risk (07/10/2021)   Humiliation, Afraid, Rape, and Kick questionnaire    Fear of Current or Ex-Partner: No    Emotionally Abused: No    Physically Abused: No    Sexually Abused: No    Outpatient Medications Prior to Visit  Medication Sig Dispense Refill   amLODipine (NORVASC) 10 MG tablet TAKE ONE TABLET ONCE DAILY 90 tablet 0   atorvastatin (LIPITOR) 80 MG tablet Take 1 tablet (80 mg total) by mouth daily. 90 tablet 3   Ferrous Sulfate (IRON PO) Take 36 mg by mouth daily.     losartan (COZAAR) 100 MG tablet Take 1 tablet (100 mg total) by mouth daily. 90 tablet 3   metFORMIN (GLUCOPHAGE-XR) 500 MG 24 hr tablet TAKE ONE TABLET ONCE DAILY WITH BREAKFAST 90 tablet 1   dapagliflozin propanediol (FARXIGA) 10 MG TABS tablet Take 1 tablet (10 mg total) by mouth daily before breakfast. 90 tablet 3   tamsulosin (FLOMAX) 0.4 MG CAPS capsule Take 1 capsule (0.4 mg total) by mouth daily. 90 capsule 3   No facility-administered medications prior to visit.    No Known Allergies  ROS Review of Systems Negative unless specially indicated above in HPI.   Objective:    Physical Exam Vitals and nursing note reviewed.  Constitutional:      General: He is not in acute distress.    Appearance: He is obese. He is not  ill-appearing, toxic-appearing or diaphoretic.  HENT:     Head: Normocephalic and atraumatic.     Right Ear: Tympanic membrane, ear canal  and external ear normal.     Left Ear: Tympanic membrane, ear canal and external ear normal.     Nose: Nose normal.     Mouth/Throat:     Mouth: Mucous membranes are moist.     Pharynx: Oropharynx is clear.  Eyes:     Extraocular Movements: Extraocular movements intact.     Pupils: Pupils are equal, round, and reactive to light.  Neck:     Vascular: No carotid bruit.  Cardiovascular:     Rate and Rhythm: Normal rate and regular rhythm.     Heart sounds: Murmur heard.  Pulmonary:     Effort: Pulmonary effort is normal. No respiratory distress.     Breath sounds: Normal breath sounds.  Abdominal:     General: Bowel sounds are normal.     Palpations: Abdomen is soft.     Tenderness: There is no abdominal tenderness. There is no guarding or rebound.  Musculoskeletal:     Cervical back: Neck supple. No tenderness.     Right lower leg: No edema.     Left lower leg: No edema.  Skin:    General: Skin is warm and dry.  Neurological:     General: No focal deficit present.     Mental Status: He is alert and oriented to person, place, and time.  Psychiatric:        Mood and Affect: Mood normal.        Behavior: Behavior normal.     BP (!) 144/89   Pulse 83   Temp 98.1 F (36.7 C) (Temporal)   Ht 6' 1"$  (1.854 m)   Wt 263 lb 6 oz (119.5 kg)   SpO2 95%   BMI 34.75 kg/m  BP Readings from Last 3 Encounters:  12/22/22 (!) 144/89  06/19/22 120/64  04/30/22 (!) 149/90    Diabetic Foot Exam - Simple   Simple Foot Form Diabetic Foot exam was performed with the following findings: Yes 12/22/2022  8:56 AM  Visual Inspection No deformities, no ulcerations, no other skin breakdown bilaterally: Yes Sensation Testing Intact to touch and monofilament testing bilaterally: Yes Pulse Check Posterior Tibialis and Dorsalis pulse intact bilaterally:  Yes Comments      Health Maintenance Due  Topic Date Due   DTaP/Tdap/Td (1 - Tdap) Never done   Medicare Annual Wellness (AWV)  07/10/2022   FOOT EXAM  12/13/2022   HEMOGLOBIN A1C  12/20/2022    There are no preventive care reminders to display for this patient.  Lab Results  Component Value Date   TSH 1.870 11/23/2020   Lab Results  Component Value Date   WBC 4.9 06/19/2022   HGB 12.9 (L) 06/19/2022   HCT 38.2 06/19/2022   MCV 91 06/19/2022   PLT 251 06/19/2022   Lab Results  Component Value Date   NA 141 06/19/2022   K 3.7 06/19/2022   CO2 20 06/19/2022   GLUCOSE 148 (H) 06/19/2022   BUN 23 06/19/2022   CREATININE 1.40 (H) 06/19/2022   BILITOT 0.4 06/19/2022   ALKPHOS 76 06/19/2022   AST 20 06/19/2022   ALT 19 06/19/2022   PROT 7.3 06/19/2022   ALBUMIN 4.7 06/19/2022   CALCIUM 9.9 06/19/2022   ANIONGAP 8 02/14/2020   EGFR 55 (L) 06/19/2022   Lab Results  Component Value Date   CHOL 140 06/19/2022   Lab Results  Component Value Date   HDL 40 06/19/2022   Lab Results  Component Value Date   LDLCALC  81 06/19/2022   Lab Results  Component Value Date   TRIG 102 06/19/2022   Lab Results  Component Value Date   CHOLHDL 3.5 06/19/2022   Lab Results  Component Value Date   HGBA1C 6.7 (H) 06/19/2022      Assessment & Plan:   Jack Cherry was seen today for diabetes, hypertension and hyperlipidemia.  Diagnoses and all orders for this visit:  Type 2 diabetes mellitus with hyperglycemia, without long-term current use of insulin (HCC) Uncontrolled. A1c is 8.8 today, not at goal of <7. Increase metformin. On ARB and statin. Foot exam today. Urine micro and eye exam UTD. Diet and exercise.  -     Bayer DCA Hb A1c Waived -     metFORMIN (GLUCOPHAGE-XR) 500 MG 24 hr tablet; Take 2 tablets (1,000 mg total) by mouth daily with breakfast.  Hyperlipidemia associated with type 2 diabetes mellitus ([place]) On statin. Lipid panel pending.  -     Lipid  panel  Hypertension associated with diabetes ([place]) BP is a little elevated today. He did walk for 40 minutes to his appt this morning.Monitor home BPs and notify for elevated reading. Diet and exercise. Continue amlodipine and losartan.  -     CMP14+EGFR  Nonrheumatic aortic valve stenosis Managed by cardiology.   Chronic kidney disease, stage 3a (HCC) Avoid NSAIDs.   Class 2 severe obesity due to excess calories with serious comorbidity and body mass index (BMI) of 35.0 to 35.9 in adult [facility]) Diet and exercise.   Lower urinary tract symptoms Nocturia Chronic. Reports no improvement with flomax. Normal PSA screening. Referral to urology.  -     Ambulatory referral to Urology   Follow-up: Return in about 3 months (around 03/22/2023) for CPE.   The patient indicates understanding of these issues and agrees with the plan.    Gwenlyn Perking, FNP

## 2023-01-16 ENCOUNTER — Telehealth: Payer: Self-pay | Admitting: Family Medicine

## 2023-01-16 NOTE — Telephone Encounter (Signed)
Called patient to schedule Medicare Annual Wellness Visit (AWV). No voicemail available to leave a message.  Last date of AWV: 07/10/2021   Please schedule an appointment at any time with either Laura or Courtney, NHA's. .  If any questions, please contact me at [phone].  Thank you,  Stephanie,  AMB Clinical Support [facility] AWV Program Direct Dial ??[phone]   

## 2023-02-04 ENCOUNTER — Other Ambulatory Visit: Payer: Self-pay | Admitting: Family Medicine

## 2023-02-04 DIAGNOSIS — I152 Hypertension secondary to endocrine disorders: Secondary | ICD-10-CM

## 2023-02-23 ENCOUNTER — Telehealth: Payer: Self-pay | Admitting: Family Medicine

## 2023-02-23 NOTE — Telephone Encounter (Signed)
Called patient to schedule Medicare Annual Wellness Visit (AWV). No voicemail available to leave a message.  Last date of AWV: 07/10/2021   Please schedule an appointment at any time with either Laura or Courtney, NHA's. .  If any questions, please contact me at [phone].  Thank you,  Stephanie,  AMB Clinical Support [facility] AWV Program Direct Dial ??[phone]   

## 2023-03-02 NOTE — Progress Notes (Signed)
H&P  Chief Complaint: Lower urinary tract symptoms  History of Present Illness: 67 year old male sent by Harlow Mares, NP for evaluation of lower urinary tract symptoms.  He has been on tamsulosin for some time.  Despite this, he still has urinary frequency.  At times his urine has been foul-smelling.  No dysuria.  No gross hematuria.  He is diabetic.  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 Darrick Penna three 3-8 mm pol;yps, simple adenomas. internal hemorrhoids. next colonoscopy in 3 years   ESOPHAGOGASTRODUODENOSCOPY N/A 01/08/2018   Dr. Darrick Penna: normal esophagus, mild gastritis/duodenitis, H.pylori   GIVENS CAPSULE STUDY N/A 12/21/2018   Procedure: GIVENS CAPSULE STUDY;  Surgeon: West Bali, MD;  Location: [facility];  Service: Endoscopy;  Laterality: N/A;  7:30am   HERNIA REPAIR      Home Medications:  Allergies as of 03/03/2023   No Known Allergies      Medication List        Accurate as of March 02, 2023  9:26 PM. If you have any questions, ask your nurse or doctor.          amLODipine 10 MG tablet Commonly known as: NORVASC TAKE ONE TABLET ONCE DAILY   atorvastatin 80 MG tablet Commonly known as: LIPITOR Take 1 tablet (80 mg total) by mouth daily.   dapagliflozin propanediol 10 MG Tabs tablet Commonly known as: Farxiga Take 1 tablet (10 mg total) by mouth daily before breakfast.   IRON PO Take 36 mg by mouth daily.   losartan 100 MG tablet Commonly known as: COZAAR Take 1 tablet (100 mg total) by mouth daily.   metFORMIN 500 MG 24 hr tablet Commonly known as: GLUCOPHAGE-XR Take 2 tablets (1,000 mg total) by mouth daily with breakfast.        Allergies: No Known Allergies  Family History  Problem Relation Age of Onset    Cancer Mother        not sure what kind   Heart disease Father 33    Social History:  reports that he has never smoked. He has never used smokeless tobacco. He reports that he does not currently use drugs. He reports that he does not drink alcohol.  ROS: A complete review of systems was performed.  All systems are negative except for pertinent findings as noted.  Physical Exam:  Vital signs in last 24 hours: There were no vitals taken for this visit. Constitutional:  Alert and oriented, No acute distress Cardiovascular: Regular rate  Respiratory: Normal respiratory effort GI: Abdomen is obese.  No hernias. Genitourinary: Normal male phallus, testes are descended bilaterally and non-tender and without masses, scrotum is normal in appearance without lesions or masses, perineum is normal on inspection.  Normal anal sphincter tone.  Prostate 40 to 50 g, symmetric, nonnodular, nontender.  No rectal masses. Lymphatic: No lymphadenopathy Neurologic: Grossly intact, no focal deficits Psychiatric: Normal mood and affect  I have reviewed notes from referring/previous physicians  I have reviewed urinalysis results  I have reviewed prior PSA results-3.6 from 8.17.2023.  20% free.  Prior urine culture from 2 years ago revealed Enterococcus.  Bladder scan volume 160 mL    Impression/Assessment:  1.  Lower urinary tract symptoms with mild  enlargement of the prostate.  Persistent despite tamsulosin which he has subsequently stopped  2.  Probable cystitis-pyuria, microscopic hematuria  Plan:  1.  Urine was cultured and was put on 5 days of Keflex  2.  Okay to stay off of tamsulosin  3.  I will have him come back in a couple of months to recheck symptoms, residual urine volume as well as urinalysis.

## 2023-03-03 ENCOUNTER — Encounter: Payer: Self-pay | Admitting: Urology

## 2023-03-03 ENCOUNTER — Ambulatory Visit (INDEPENDENT_AMBULATORY_CARE_PROVIDER_SITE_OTHER): Payer: Medicare Other | Admitting: Urology

## 2023-03-03 VITALS — BP 161/98 | HR 83

## 2023-03-03 DIAGNOSIS — R3915 Urgency of urination: Secondary | ICD-10-CM

## 2023-03-03 DIAGNOSIS — N401 Enlarged prostate with lower urinary tract symptoms: Secondary | ICD-10-CM

## 2023-03-03 DIAGNOSIS — R8281 Pyuria: Secondary | ICD-10-CM

## 2023-03-03 DIAGNOSIS — R351 Nocturia: Secondary | ICD-10-CM | POA: Diagnosis not present

## 2023-03-03 DIAGNOSIS — N3001 Acute cystitis with hematuria: Secondary | ICD-10-CM

## 2023-03-03 DIAGNOSIS — R350 Frequency of micturition: Secondary | ICD-10-CM

## 2023-03-03 DIAGNOSIS — N138 Other obstructive and reflux uropathy: Secondary | ICD-10-CM

## 2023-03-03 LAB — URINALYSIS, ROUTINE W REFLEX MICROSCOPIC
Bilirubin, UA: NEGATIVE
Ketones, UA: NEGATIVE
Nitrite, UA: POSITIVE — AB
Specific Gravity, UA: 1.025 (ref 1.005–1.030)
Urobilinogen, Ur: 0.2 mg/dL (ref 0.2–1.0)
pH, UA: 5.5 (ref 5.0–7.5)

## 2023-03-03 LAB — MICROSCOPIC EXAMINATION: WBC, UA: >30 /hpf — AB (ref 0–5)

## 2023-03-03 MED ORDER — CEPHALEXIN 500 MG PO CAPS: 500.0000 mg | ORAL_CAPSULE | Freq: Two times a day (BID) | ORAL | 0 refills | Status: AC

## 2023-03-07 LAB — URINE CULTURE

## 2023-03-13 ENCOUNTER — Telehealth: Payer: Self-pay

## 2023-03-13 NOTE — Telephone Encounter (Signed)
Left message for return call with patient's sister

## 2023-03-13 NOTE — Telephone Encounter (Signed)
-----   Message from Marcine Matar, MD sent at 03/13/2023 11:49 AM EDT ----- Let pt know that culture did show uti--finish all of med ----- Message ----- From: Troy Sine, CMA Sent: 03/11/2023   1:38 PM EDT To: Marcine Matar, MD  Please review

## 2023-03-16 ENCOUNTER — Telehealth: Payer: Self-pay

## 2023-03-16 NOTE — Telephone Encounter (Signed)
Patient is returning a call regarding his recent labs results. The patient advised that if he is not home you can leave a message with he results and he will check it once he is home.    Thank you

## 2023-03-16 NOTE — Telephone Encounter (Signed)
Tried calling patient back on his home line and his sister  number. Unable to reach patient by  home phone, voice mail box not set up.

## 2023-03-16 NOTE — Telephone Encounter (Signed)
Letter sent out 

## 2023-03-20 ENCOUNTER — Ambulatory Visit (INDEPENDENT_AMBULATORY_CARE_PROVIDER_SITE_OTHER): Payer: Medicare Other | Admitting: Family Medicine

## 2023-03-20 ENCOUNTER — Encounter: Payer: Self-pay | Admitting: Family Medicine

## 2023-03-20 VITALS — BP 153/76 | HR 74 | Temp 97.9°F | Ht 73.0 in | Wt 261.8 lb

## 2023-03-20 DIAGNOSIS — E1159 Type 2 diabetes mellitus with other circulatory complications: Secondary | ICD-10-CM | POA: Diagnosis not present

## 2023-03-20 DIAGNOSIS — I152 Hypertension secondary to endocrine disorders: Secondary | ICD-10-CM

## 2023-03-20 DIAGNOSIS — I350 Nonrheumatic aortic (valve) stenosis: Secondary | ICD-10-CM

## 2023-03-20 DIAGNOSIS — E785 Hyperlipidemia, unspecified: Secondary | ICD-10-CM

## 2023-03-20 DIAGNOSIS — Z6834 Body mass index (BMI) 34.0-34.9, adult: Secondary | ICD-10-CM

## 2023-03-20 DIAGNOSIS — E6609 Other obesity due to excess calories: Secondary | ICD-10-CM | POA: Insufficient documentation

## 2023-03-20 DIAGNOSIS — E1165 Type 2 diabetes mellitus with hyperglycemia: Secondary | ICD-10-CM | POA: Diagnosis not present

## 2023-03-20 DIAGNOSIS — E1169 Type 2 diabetes mellitus with other specified complication: Secondary | ICD-10-CM

## 2023-03-20 DIAGNOSIS — Z7984 Long term (current) use of oral hypoglycemic drugs: Secondary | ICD-10-CM

## 2023-03-20 LAB — CMP14+EGFR
Potassium: 4.1 mmol/L (ref 3.5–5.2)
Sodium: 142 mmol/L (ref 134–144)

## 2023-03-20 LAB — BAYER DCA HB A1C WAIVED: HB A1C (BAYER DCA - WAIVED): 7.6 % — ABNORMAL HIGH (ref 4.8–5.6)

## 2023-03-20 MED ORDER — GLIPIZIDE 5 MG PO TABS
5.0000 mg | ORAL_TABLET | Freq: Two times a day (BID) | ORAL | 3 refills | Status: DC
Start: 2023-03-20 — End: 2024-05-31

## 2023-03-20 NOTE — Progress Notes (Signed)
Complete physical exam  Patient: Jack Cherry   DOB: [birth year]   67 y.o. Male  MRN: [id number]  Subjective:    Chief Complaint  Patient presents with   Annual Exam    Jack Cherry is a 67 y.o. male who presents today for a complete physical exam. He reports consuming a general diet. The patient does not participate in regular exercise at present. He generally feels fairly well. He reports sleeping fairly well. He does not have additional problems to discuss today.    Most recent fall risk assessment:    03/20/2023    9:07 AM  Fall Risk   Falls in the past year? 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   losartan (COZAAR) 100 MG tablet Take 1 tablet (100 mg total) by mouth daily.   metFORMIN (GLUCOPHAGE-XR) 500 MG 24 hr tablet Take 2 tablets (1,000 mg total) by mouth daily with breakfast.   tamsulosin (FLOMAX) 0.4 MG CAPS capsule Take 0.4 mg by mouth.   dapagliflozin propanediol (FARXIGA) 10 MG TABS tablet Take 1 tablet (10 mg total) by mouth daily before breakfast. (Patient not taking: Reported on 03/03/2023)   Ferrous Sulfate (IRON PO) Take 36 mg by mouth daily. (Patient not  taking: Reported on 03/20/2023)   No facility-administered medications prior to visit.    ROS Negative unless specially indicated above in HPI.     Objective:     BP (!) 153/76   Pulse 74   Temp 97.9 F (36.6 C) (Temporal)   Ht 6\' 1"  (1.854 m)   Wt 261 lb 12.8 oz (118.8 kg)   SpO2 93%   BMI 34.54 kg/m  BP Readings from Last 3 Encounters:  03/20/23 (!) 153/76  03/03/23 (!) 161/98  12/22/22 (!) 144/89      Physical Exam Vitals and nursing note reviewed.  Constitutional:      General: He is not in acute distress.    Appearance: He is obese. He is not ill-appearing, toxic-appearing or diaphoretic.  HENT:     Head: Normocephalic.     Right Ear: Tympanic membrane, ear canal and external ear normal.     Left Ear: Tympanic membrane, ear canal and external ear normal.     Nose: Nose normal.     Mouth/Throat:     Mouth: Mucous membranes are moist.     Pharynx: Oropharynx is clear.  Eyes:     Extraocular Movements: Extraocular movements intact.     Conjunctiva/sclera: Conjunctivae normal.     Pupils: Pupils are equal, round, and reactive  to light.  Neck:     Thyroid: No thyroid mass, thyromegaly or thyroid tenderness.     Vascular: No carotid bruit.  Cardiovascular:     Rate and Rhythm: Normal rate and regular rhythm.     Pulses: Normal pulses.     Heart sounds: Murmur heard.     Systolic murmur is present with a grade of 3/6.     No friction rub. No gallop. No S3 or S4 sounds.  Pulmonary:     Effort: Pulmonary effort is normal.     Breath sounds: Normal breath sounds.  Abdominal:     General: Bowel sounds are normal. There is no distension.     Palpations: Abdomen is soft. There is no mass.     Tenderness: There is no abdominal tenderness. There is no guarding.  Musculoskeletal:        General: No swelling.     Cervical back: Neck supple. No tenderness.     Right lower leg: No edema.     Left lower leg: No edema.  Skin:    General: Skin is warm and dry.      Capillary Refill: Capillary refill takes less than 2 seconds.     Findings: No lesion or rash.  Neurological:     General: No focal deficit present.     Mental Status: He is alert and oriented to person, place, and time.  Psychiatric:        Mood and Affect: Mood normal.        Behavior: Behavior normal.        Thought Content: Thought content normal.        Judgment: Judgment normal.      No results found for any visits on 03/20/23.     Assessment & Plan:    Routine Health Maintenance and Physical Exam  [name] was seen today for annual exam.  Diagnoses and all orders for this visit:  Type 2 diabetes mellitus with hyperglycemia, without long-term current use of insulin (HCC) A1c 7.6 today, not at goal of <7. Medication changes today: add glipizide BID since he has very limited funds. He is on an ACE/ARB and statin. Eye exam: reminded to schedule. Foot exam: UTD. Urine micro: today. Diet and exercise.  -     Bayer DCA Hb A1c Waived -     Microalbumin / creatinine urine ratio -     glipiZIDE (GLUCOTROL) 5 MG tablet; Take 1 tablet (5 mg total) by mouth 2 (two) times daily before a meal.  Hypertension associated with diabetes (HCC) BP not at goal today, Discussed compliance with medications.  -     CBC with Differential/Platelet -     CMP14+EGFR  Hyperlipidemia associated with type 2 diabetes mellitus (HCC) Last LDL 69. On statin.   Nonrheumatic aortic valve stenosis Managed by cardiology.   Class 1 obesity due to excess calories with serious comorbidity and body mass index (BMI) of 34.0 to 34.9 in adult Diet and exercise.   Immunization History  Administered Date(s) Administered   Fluad Quad(high Dose 65+) 11/23/2020, 09/13/2021   Influenza Split 08/07/2015, 08/06/2016   Influenza,inj,Quad PF,6+ Mos 08/06/2017, 09/04/2018   Influenza-Unspecified 08/16/2019   Moderna Sars-Covid-2 Vaccination 02/14/2020, 03/13/2020   PNEUMOCOCCAL CONJUGATE-20 03/12/2022   Pneumococcal  Conjugate-13 11/23/2020    Health Maintenance  Topic Date Due   DTaP/Tdap/Td (1 - Tdap) Never done   Medicare Annual Wellness (AWV)  07/10/2022   Diabetic kidney evaluation - Urine ACR  03/13/2023  Zoster Vaccines- Shingrix (1 of 2) 03/22/2023 (Originally 09/02/2005)   COVID-19 Vaccine (3 - 2023-24 season) 04/05/2023 (Originally 07/04/2022)   OPHTHALMOLOGY EXAM  06/20/2023 (Originally 01/19/2019)   INFLUENZA VACCINE  06/04/2023   HEMOGLOBIN A1C  06/22/2023   Diabetic kidney evaluation - eGFR measurement  12/23/2023   FOOT EXAM  12/23/2023   COLONOSCOPY (Pts 45-41yrs Insurance coverage will need to be confirmed)  01/09/2028   Pneumonia Vaccine 44+ Years old  Completed   Hepatitis C Screening  Completed   HPV VACCINES  Aged Out   COLON CANCER SCREENING ANNUAL FOBT  Discontinued    Discussed health benefits of physical activity, and encouraged him to engage in regular exercise appropriate for his age and condition.  Problem List Items Addressed This Visit       Cardiovascular and Mediastinum   Nonrheumatic aortic valve stenosis   Hypertension associated with diabetes (HCC)   Relevant Medications   glipiZIDE (GLUCOTROL) 5 MG tablet   Other Relevant Orders   CBC with Differential/Platelet   CMP14+EGFR     Endocrine   Type 2 diabetes mellitus with hyperglycemia, without long-term current use of insulin (HCC) - Primary   Relevant Medications   glipiZIDE (GLUCOTROL) 5 MG tablet   Other Relevant Orders   Bayer DCA Hb A1c Waived   Microalbumin / creatinine urine ratio   Hyperlipidemia associated with type 2 diabetes mellitus (HCC)   Relevant Medications   glipiZIDE (GLUCOTROL) 5 MG tablet     Other   Class 1 obesity due to excess calories with serious comorbidity and body mass index (BMI) of 34.0 to 34.9 in adult   Relevant Medications   glipiZIDE (GLUCOTROL) 5 MG tablet   Return in 3 months (on 06/20/2023) for chronic follow up, schedule AWV .     Gabriel Earing,  FNP

## 2023-03-21 LAB — CBC WITH DIFFERENTIAL/PLATELET
Basophils Absolute: 0 10*3/uL (ref 0.0–0.2)
Basos: 1 %
EOS (ABSOLUTE): 0.1 10*3/uL (ref 0.0–0.4)
Eos: 2 %
Hematocrit: 35.5 % — ABNORMAL LOW (ref 37.5–51.0)
Hemoglobin: 11.9 g/dL — ABNORMAL LOW (ref 13.0–17.7)
Immature Grans (Abs): 0 10*3/uL (ref 0.0–0.1)
Immature Granulocytes: 1 %
Lymphocytes Absolute: 1.3 10*3/uL (ref 0.7–3.1)
Lymphs: 37 %
MCH: 30.3 pg (ref 26.6–33.0)
MCHC: 33.5 g/dL (ref 31.5–35.7)
MCV: 90 fL (ref 79–97)
Monocytes Absolute: 0.2 10*3/uL (ref 0.1–0.9)
Monocytes: 7 %
Neutrophils Absolute: 1.8 10*3/uL (ref 1.4–7.0)
Neutrophils: 52 %
Platelets: 220 10*3/uL (ref 150–450)
RBC: 3.93 x10E6/uL — ABNORMAL LOW (ref 4.14–5.80)
RDW: 13.2 % (ref 11.6–15.4)
WBC: 3.4 10*3/uL (ref 3.4–10.8)

## 2023-03-21 LAB — CMP14+EGFR
ALT: 17 IU/L (ref 0–44)
AST: 21 IU/L (ref 0–40)
Albumin/Globulin Ratio: 1.7 (ref 1.2–2.2)
Albumin: 4.3 g/dL (ref 3.9–4.9)
BUN/Creatinine Ratio: 16 (ref 10–24)
BUN: 16 mg/dL (ref 8–27)
Bilirubin Total: 0.3 mg/dL (ref 0.0–1.2)
CO2: 21 mmol/L (ref 20–29)
Calcium: 9.3 mg/dL (ref 8.6–10.2)
Chloride: 108 mmol/L — ABNORMAL HIGH (ref 96–106)
Creatinine, Ser: 0.98 mg/dL (ref 0.76–1.27)
Globulin, Total: 2.5 g/dL (ref 1.5–4.5)
Total Protein: 6.8 g/dL (ref 6.0–8.5)

## 2023-04-01 ENCOUNTER — Other Ambulatory Visit: Payer: Self-pay | Admitting: Family Medicine

## 2023-04-03 ENCOUNTER — Ambulatory Visit: Payer: Medicare Other | Admitting: Family Medicine

## 2023-04-07 ENCOUNTER — Ambulatory Visit (INDEPENDENT_AMBULATORY_CARE_PROVIDER_SITE_OTHER): Payer: Medicare Other

## 2023-04-07 VITALS — Ht 73.0 in | Wt 260.0 lb

## 2023-04-07 DIAGNOSIS — Z0100 Encounter for examination of eyes and vision without abnormal findings: Secondary | ICD-10-CM

## 2023-04-07 DIAGNOSIS — Z0000 Encounter for general adult medical examination without abnormal findings: Secondary | ICD-10-CM

## 2023-04-07 NOTE — Progress Notes (Signed)
Subjective:   Jack Cherry is a 67 y.o. male who presents for an Initial Medicare Annual Wellness Visit. I connected with  Jack Cherry on 04/07/23 by a audio enabled telemedicine application and verified that I am speaking with the correct person using two identifiers.  Patient Location: Home  Provider Location: Home Office  I discussed the limitations of evaluation and management by telemedicine. The patient expressed understanding and agreed to proceed.  Review of Systems     Cardiac Risk Factors include: advanced age (>71men, >27 women);diabetes mellitus;dyslipidemia;hypertension;male gender     Objective:    Today's Vitals   04/07/23 0916  Weight: 260 lb (117.9 kg)  Height: 6\' 1"  (1.854 m)   Body mass index is 34.3 kg/m.     04/07/2023    9:19 AM 07/10/2021   10:03 AM 11/28/2019    7:07 PM 11/17/2018    2:05 PM 01/08/2018   12:31 PM  Advanced Directives  Does Patient Have a Medical Advance Directive? No No No No No  Would patient like information on creating a medical advance directive? No - Patient declined No - Patient declined No - Patient declined No - Patient declined No - Patient declined    Current Medications (verified) Outpatient Encounter Medications as of 04/07/2023  Medication Sig   amLODipine (NORVASC) 10 MG tablet TAKE ONE TABLET ONCE DAILY   atorvastatin (LIPITOR) 80 MG tablet Take 1 tablet (80 mg total) by mouth daily.   Ferrous Sulfate (IRON PO) Take 36 mg by mouth daily.   glipiZIDE (GLUCOTROL) 5 MG tablet Take 1 tablet (5 mg total) by mouth 2 (two) times daily before a meal.   losartan (COZAAR) 100 MG tablet Take 1 tablet (100 mg total) by mouth daily.   metFORMIN (GLUCOPHAGE-XR) 500 MG 24 hr tablet Take 2 tablets (1,000 mg total) by mouth daily with breakfast.   tamsulosin (FLOMAX) 0.4 MG CAPS capsule Take 0.4 mg by mouth.   No facility-administered encounter medications on file as of 04/07/2023.    Allergies (verified) Patient has no known  allergies.   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 Darrick Penna three 3-8 mm pol;yps, simple adenomas. internal hemorrhoids. next colonoscopy in 3 years   ESOPHAGOGASTRODUODENOSCOPY N/A 01/08/2018   Dr. Darrick Penna: normal esophagus, mild gastritis/duodenitis, H.pylori   GIVENS CAPSULE STUDY N/A 12/21/2018   Procedure: GIVENS CAPSULE STUDY;  Surgeon: West Bali, MD;  Location: [facility];  Service: Endoscopy;  Laterality: N/A;  7:30am   HERNIA REPAIR     Family History  Problem Relation Age of Onset   Cancer Mother        not sure what kind   Heart disease Father 69   Social History   Socioeconomic History   Marital status: Single    Spouse name: Not on file   Number of children: 0   Years of education: 9   Highest education level: 9th grade  Occupational History   Not on file  Tobacco Use   Smoking status: Never   Smokeless tobacco: Never  Vaping Use   Vaping Use: Never used  Substance and Sexual Activity   Alcohol use: No   Drug use: Not Currently    Comment: last use  of Marijuana 20 years ago   Sexual activity: Not Currently  Other Topics Concern   Not on file  Social History Narrative   Lives with nephew   Social Determinants of Health   Financial Resource Strain: Low Risk  (04/07/2023)   Overall Financial Resource Strain (CARDIA)    Difficulty of Paying Living Expenses: Not hard at all  Food Insecurity: No Food Insecurity (04/07/2023)   Hunger Vital Sign    Worried About Running Out of Food in the Last Year: Never true    Ran Out of Food in the Last Year: Never true  Transportation Needs: No Transportation Needs (04/07/2023)   PRAPARE - Administrator, Civil Service (Medical): No    Lack of Transportation (Non-Medical): No   Physical Activity: Insufficiently Active (04/07/2023)   Exercise Vital Sign    Days of Exercise per Week: 3 days    Minutes of Exercise per Session: 30 min  Stress: No Stress Concern Present (04/07/2023)   [employer] of Occupational Health - Occupational Stress Questionnaire    Feeling of Stress : Not at all  Social Connections: Socially Isolated (04/07/2023)   Social Connection and Isolation Panel [NHANES]    Frequency of Communication with Friends and Family: More than three times a week    Frequency of Social Gatherings with Friends and Family: More than three times a week    Attends Religious Services: Never    Database administrator or Organizations: No    Attends Engineer, structural: Never    Marital Status: Never married    Tobacco Counseling Counseling given: Not Answered   Clinical Intake:  Pre-visit preparation completed: Yes  Pain : No/denies pain     Nutritional Risks: None Diabetes: Yes CBG done?: No Did pt. bring in CBG monitor from home?: No  How often do you need to have someone help you when you read instructions, pamphlets, or other written materials from your doctor or pharmacy?: 1 - Never  Diabetic?yes  Nutrition Risk Assessment:  Has the patient had any N/V/D within the last 2 months?  No  Does the patient have any non-healing wounds?  No  Has the patient had any unintentional weight loss or weight gain?  No   Diabetes:  Is the patient diabetic?  Yes  If diabetic, was a CBG obtained today?  No  Did the patient bring in their glucometer from home?  No  How often do you monitor your CBG's? Never .   Financial Strains and Diabetes Management:  Are you having any financial strains with the device, your supplies or your medication? No .  Does the patient want to be seen by Chronic Care Management for management of their diabetes?  No  Would the patient like to be referred to a Nutritionist or for Diabetic Management?  No   Diabetic  Exams:  Diabetic Eye Exam: Overdue for diabetic eye exam. Pt has been advised about the importance in completing this exam. Patient advised to call and schedule an eye exam. Diabetic Foot Exam: Overdue, Pt has been advised about the importance in completing this exam. Pt is scheduled for diabetic foot exam on next office visit .   Interpreter Needed?: No  Information entered by :: Renie Ora, LPN   Activities of Daily Living    04/07/2023    9:19 AM  In your present state of health, do you have any difficulty performing the following activities:  Hearing? 0  Vision? 0  Difficulty concentrating or making decisions? 0  Walking or climbing stairs? 0  Dressing or bathing? 0  Doing errands, shopping? 0  Preparing Food and eating ? N  Using the Toilet? N  In the past six months, have you accidently leaked urine? N  Do you have problems with loss of bowel control? N  Managing your Medications? N  Managing your Finances? N  Housekeeping or managing your Housekeeping? N    Patient Care Team: Gabriel Earing, FNP as PCP - General (Family Medicine) West Bali, MD (Inactive) as Consulting Physician (Gastroenterology) Lanelle Bal, DO as Consulting Physician (Internal Medicine)  Indicate any recent Medical Services you may have received from other than [facility] providers in the past year (date may be approximate).     Assessment:   This is a routine wellness examination for [facility].  Hearing/Vision screen Vision Screening - Comments:: Referral 04/07/2023  Dietary issues and exercise activities discussed: Current Exercise Habits: Home exercise routine, Type of exercise: walking, Time (Minutes): 30, Frequency (Times/Week): 3, Weekly Exercise (Minutes/Week): 90, Intensity: Mild, Exercise limited by: None identified   Goals Addressed             This Visit's Progress    DIET - INCREASE WATER INTAKE         Depression Screen    04/07/2023    9:18 AM 03/20/2023    9:07  AM 12/22/2022    8:21 AM 12/22/2022    8:19 AM 06/19/2022    8:40 AM 03/12/2022    8:09 AM 12/13/2021    9:01 AM  PHQ 2/9 Scores  PHQ - 2 Score 0 0 0 0 0 0 0  PHQ- 9 Score 0 0 0  0 0     Fall Risk    04/07/2023    9:17 AM 03/20/2023    9:07 AM 12/22/2022    8:34 AM 06/19/2022    8:39 AM 03/12/2022    8:09 AM  Fall Risk   Falls in the past year? 0 0 0 0 0  Number falls in past yr: 0      Injury with Fall? 0      Risk for fall due to : No Fall Risks      Follow up Falls prevention discussed        FALL RISK PREVENTION PERTAINING TO THE HOME:  Any stairs in or around the home? No  If so, are there any without handrails? No  Home free of loose throw rugs in walkways, pet beds, electrical cords, etc? Yes  Adequate lighting in your home to reduce risk of falls? Yes   ASSISTIVE DEVICES UTILIZED TO PREVENT FALLS:  Life alert? No  Use of a cane, walker or w/c? No  Grab bars in the bathroom? No  Shower chair or bench in shower? No  Elevated toilet seat or a handicapped toilet? No       07/10/2021   10:11 AM  MMSE - Mini Mental State Exam  Orientation to time 5  Orientation to Place 5  Registration 3  Attention/ Calculation 2  Recall 1  Language- name 2 objects 2  Language- repeat 1  Language- follow 3 step command 3  Language- read & follow direction 1  Write a sentence 1  Copy design 1  Total score 25        04/07/2023    9:19 AM  6CIT Screen  What Year? 0 points  What month? 0 points  What time?  0 points  Count back from 20 0 points  Months in reverse 0 points  Repeat phrase 0 points  Total Score 0 points    Immunizations Immunization History  Administered Date(s) Administered   Fluad Quad(high Dose 65+) 11/23/2020, 09/13/2021   Influenza Split 08/07/2015, 08/06/2016   Influenza,inj,Quad PF,6+ Mos 08/06/2017, 09/04/2018   Influenza-Unspecified 08/16/2019   Moderna Sars-Covid-2 Vaccination 02/14/2020, 03/13/2020   PNEUMOCOCCAL CONJUGATE-20 03/12/2022    Pneumococcal Conjugate-13 11/23/2020    TDAP status: Due, Education has been provided regarding the importance of this vaccine. Advised may receive this vaccine at local pharmacy or Health Dept. Aware to provide a copy of the vaccination record if obtained from local pharmacy or Health Dept. Verbalized acceptance and understanding.  Flu Vaccine status: Declined, Education has been provided regarding the importance of this vaccine but patient still declined. Advised may receive this vaccine at local pharmacy or Health Dept. Aware to provide a copy of the vaccination record if obtained from local pharmacy or Health Dept. Verbalized acceptance and understanding.  Pneumococcal vaccine status: Up to date  Covid-19 vaccine status: Completed vaccines  Qualifies for Shingles Vaccine? Yes   Zostavax completed No   Shingrix Completed?: No.    Education has been provided regarding the importance of this vaccine. Patient has been advised to call insurance company to determine out of pocket expense if they have not yet received this vaccine. Advised may also receive vaccine at local pharmacy or Health Dept. Verbalized acceptance and understanding.  Screening Tests Health Maintenance  Topic Date Due   DTaP/Tdap/Td (1 - Tdap) Never done   Zoster Vaccines- Shingrix (1 of 2) Never done   Diabetic kidney evaluation - Urine ACR  03/13/2023   COVID-19 Vaccine (3 - 2023-24 season) 04/23/2023 (Originally 07/04/2022)   OPHTHALMOLOGY EXAM  06/20/2023 (Originally 01/19/2019)   INFLUENZA VACCINE  06/04/2023   HEMOGLOBIN A1C  09/20/2023   FOOT EXAM  12/23/2023   Diabetic kidney evaluation - eGFR measurement  03/19/2024   Medicare Annual Wellness (AWV)  04/06/2024   Colonoscopy  01/09/2028   Pneumonia Vaccine 77+ Years old  Completed   Hepatitis C Screening  Completed   HPV VACCINES  Aged Out   COLON CANCER SCREENING ANNUAL FOBT  Discontinued    Health Maintenance  Health Maintenance Due  Topic Date Due    DTaP/Tdap/Td (1 - Tdap) Never done   Zoster Vaccines- Shingrix (1 of 2) Never done   Diabetic kidney evaluation - Urine ACR  03/13/2023    Colorectal cancer screening: Type of screening: Colonoscopy. Completed 01/08/2018. Repeat every 10 years  Lung Cancer Screening: (Low Dose CT Chest recommended if Age 60-80 years, 30 pack-year currently smoking OR have quit w/in 15years.) does not qualify.   Lung Cancer Screening Referral: n/a  Additional Screening:  Hepatitis C Screening: does not qualify; Completed 03/12/2022  Vision Screening: Recommended annual ophthalmology exams for early detection of glaucoma and other disorders of the eye. Is the patient up to date with their annual eye exam?  No  Who is the provider or what is the name of the office in which the patient attends annual eye exams? None , referral 04/07/2023 If pt is not established with a provider, would they like to be referred to a provider to establish care? No .   Dental Screening: Recommended annual dental exams for proper oral hygiene  Community Resource Referral / Chronic Care Management: CRR required this visit?  No   CCM required this visit?  No  Plan:     I have personally reviewed and noted the following in the patient's chart:   Medical and social history Use of alcohol, tobacco or illicit drugs  Current medications and supplements including opioid prescriptions. Patient is not currently taking opioid prescriptions. Functional ability and status Nutritional status Physical activity Advanced directives List of other physicians Hospitalizations, surgeries, and ER visits in previous 12 months Vitals Screenings to include cognitive, depression, and falls Referrals and appointments  In addition, I have reviewed and discussed with patient certain preventive protocols, quality metrics, and best practice recommendations. A written personalized care plan for preventive services as well as general  preventive health recommendations were provided to patient.     Lorrene Reid, LPN   6/0/4540   Nurse Notes: Due TDAP Vaccine

## 2023-04-07 NOTE — Patient Instructions (Signed)
Jack Cherry , Thank you for taking time to come for your Medicare Wellness Visit. I appreciate your ongoing commitment to your health goals. Please review the following plan we discussed and let me know if I can assist you in the future.   These are the goals we discussed:  Goals      DIET - INCREASE WATER INTAKE     Exercise 150 min/wk Moderate Activity        This is a list of the screening recommended for you and due dates:  Health Maintenance  Topic Date Due   DTaP/Tdap/Td vaccine (1 - Tdap) Never done   Zoster (Shingles) Vaccine (1 of 2) Never done   Yearly kidney health urinalysis for diabetes  03/13/2023   COVID-19 Vaccine (3 - 2023-24 season) 04/23/2023*   Eye exam for diabetics  06/20/2023*   Flu Shot  06/04/2023   Hemoglobin A1C  09/20/2023   Complete foot exam   12/23/2023   Yearly kidney function blood test for diabetes  03/19/2024   Medicare Annual Wellness Visit  04/06/2024   Colon Cancer Screening  01/09/2028   Pneumonia Vaccine  Completed   Hepatitis C Screening  Completed   HPV Vaccine  Aged Out   Stool Blood Test  Discontinued  *Topic was postponed. The date shown is not the original due date.    Advanced directives: Advance directive discussed with you today. I have provided a copy for you to complete at home and have notarized. Once this is complete please bring a copy in to our office so we can scan it into your chart.   Conditions/risks identified: Aim for 30 minutes of exercise or brisk walking, 6-8 glasses of water, and 5 servings of fruits and vegetables each day.   Next appointment: Follow up in one year for your annual wellness visit.   Preventive Care 68 Years and Older, Male  Preventive care refers to lifestyle choices and visits with your health care provider that can promote health and wellness. What does preventive care include? A yearly physical exam. This is also called an annual well check. Dental exams once or twice a year. Routine eye  exams. Ask your health care provider how often you should have your eyes checked. Personal lifestyle choices, including: Daily care of your teeth and gums. Regular physical activity. Eating a healthy diet. Avoiding tobacco and drug use. Limiting alcohol use. Practicing safe sex. Taking low doses of aspirin every day. Taking vitamin and mineral supplements as recommended by your health care provider. What happens during an annual well check? The services and screenings done by your health care provider during your annual well check will depend on your age, overall health, lifestyle risk factors, and family history of disease. Counseling  Your health care provider may ask you questions about your: Alcohol use. Tobacco use. Drug use. Emotional well-being. Home and relationship well-being. Sexual activity. Eating habits. History of falls. Memory and ability to understand (cognition). Work and work Astronomer. Screening  You may have the following tests or measurements: Height, weight, and BMI. Blood pressure. Lipid and cholesterol levels. These may be checked every 5 years, or more frequently if you are over 51 years old. Skin check. Lung cancer screening. You may have this screening every year starting at age 13 if you have a 30-pack-year history of smoking and currently smoke or have quit within the past 15 years. Fecal occult blood test (FOBT) of the stool. You may have this test every year starting  at age 40. Flexible sigmoidoscopy or colonoscopy. You may have a sigmoidoscopy every 5 years or a colonoscopy every 10 years starting at age 68. Prostate cancer screening. Recommendations will vary depending on your family history and other risks. Hepatitis C blood test. Hepatitis B blood test. Sexually transmitted disease (STD) testing. Diabetes screening. This is done by checking your blood sugar (glucose) after you have not eaten for a while (fasting). You may have this done every  1-3 years. Abdominal aortic aneurysm (AAA) screening. You may need this if you are a current or former smoker. Osteoporosis. You may be screened starting at age 68 if you are at high risk. Talk with your health care provider about your test results, treatment options, and if necessary, the need for more tests. Vaccines  Your health care provider may recommend certain vaccines, such as: Influenza vaccine. This is recommended every year. Tetanus, diphtheria, and acellular pertussis (Tdap, Td) vaccine. You may need a Td booster every 10 years. Zoster vaccine. You may need this after age 40. Pneumococcal 13-valent conjugate (PCV13) vaccine. One dose is recommended after age 70. Pneumococcal polysaccharide (PPSV23) vaccine. One dose is recommended after age 2. Talk to your health care provider about which screenings and vaccines you need and how often you need them. This information is not intended to replace advice given to you by your health care provider. Make sure you discuss any questions you have with your health care provider. Document Released: 11/16/2015 Document Revised: 07/09/2016 Document Reviewed: 08/21/2015 Elsevier Interactive Patient Education  2017 [employer].  Fall Prevention in the Home Falls can cause injuries. They can happen to people of all ages. There are many things you can do to make your home safe and to help prevent falls. What can I do on the outside of my home? Regularly fix the edges of walkways and driveways and fix any cracks. Remove anything that might make you trip as you walk through a door, such as a raised step or threshold. Trim any bushes or trees on the path to your home. Use bright outdoor lighting. Clear any walking paths of anything that might make someone trip, such as rocks or tools. Regularly check to see if handrails are loose or broken. Make sure that both sides of any steps have handrails. Any raised decks and porches should have guardrails on  the edges. Have any leaves, snow, or ice cleared regularly. Use sand or salt on walking paths during winter. Clean up any spills in your garage right away. This includes oil or grease spills. What can I do in the bathroom? Use night lights. Install grab bars by the toilet and in the tub and shower. Do not use towel bars as grab bars. Use non-skid mats or decals in the tub or shower. If you need to sit down in the shower, use a plastic, non-slip stool. Keep the floor dry. Clean up any water that spills on the floor as soon as it happens. Remove soap buildup in the tub or shower regularly. Attach bath mats securely with double-sided non-slip rug tape. Do not have throw rugs and other things on the floor that can make you trip. What can I do in the bedroom? Use night lights. Make sure that you have a light by your bed that is easy to reach. Do not use any sheets or blankets that are too big for your bed. They should not hang down onto the floor. Have a firm chair that has side arms. You  can use this for support while you get dressed. Do not have throw rugs and other things on the floor that can make you trip. What can I do in the kitchen? Clean up any spills right away. Avoid walking on wet floors. Keep items that you use a lot in easy-to-reach places. If you need to reach something above you, use a strong step stool that has a grab bar. Keep electrical cords out of the way. Do not use floor polish or wax that makes floors slippery. If you must use wax, use non-skid floor wax. Do not have throw rugs and other things on the floor that can make you trip. What can I do with my stairs? Do not leave any items on the stairs. Make sure that there are handrails on both sides of the stairs and use them. Fix handrails that are broken or loose. Make sure that handrails are as long as the stairways. Check any carpeting to make sure that it is firmly attached to the stairs. Fix any carpet that is loose  or worn. Avoid having throw rugs at the top or bottom of the stairs. If you do have throw rugs, attach them to the floor with carpet tape. Make sure that you have a light switch at the top of the stairs and the bottom of the stairs. If you do not have them, ask someone to add them for you. What else can I do to help prevent falls? Wear shoes that: Do not have high heels. Have rubber bottoms. Are comfortable and fit you well. Are closed at the toe. Do not wear sandals. If you use a stepladder: Make sure that it is fully opened. Do not climb a closed stepladder. Make sure that both sides of the stepladder are locked into place. Ask someone to hold it for you, if possible. Clearly mark and make sure that you can see: Any grab bars or handrails. First and last steps. Where the edge of each step is. Use tools that help you move around (mobility aids) if they are needed. These include: Canes. Walkers. Scooters. Crutches. Turn on the lights when you go into a dark area. Replace any light bulbs as soon as they burn out. Set up your furniture so you have a clear path. Avoid moving your furniture around. If any of your floors are uneven, fix them. If there are any pets around you, be aware of where they are. Review your medicines with your doctor. Some medicines can make you feel dizzy. This can increase your chance of falling. Ask your doctor what other things that you can do to help prevent falls. This information is not intended to replace advice given to you by your health care provider. Make sure you discuss any questions you have with your health care provider. Document Released: 08/16/2009 Document Revised: 03/27/2016 Document Reviewed: 11/24/2014 Elsevier Interactive Patient Education  2017 [employer].

## 2023-04-17 ENCOUNTER — Other Ambulatory Visit: Payer: Self-pay | Admitting: Family Medicine

## 2023-04-17 DIAGNOSIS — E1165 Type 2 diabetes mellitus with hyperglycemia: Secondary | ICD-10-CM

## 2023-04-21 ENCOUNTER — Ambulatory Visit: Payer: Medicare Other | Admitting: Urology

## 2023-04-21 DIAGNOSIS — R351 Nocturia: Secondary | ICD-10-CM

## 2023-04-21 DIAGNOSIS — N3001 Acute cystitis with hematuria: Secondary | ICD-10-CM

## 2023-04-21 DIAGNOSIS — N138 Other obstructive and reflux uropathy: Secondary | ICD-10-CM

## 2023-04-21 DIAGNOSIS — R350 Frequency of micturition: Secondary | ICD-10-CM

## 2023-04-21 DIAGNOSIS — R3915 Urgency of urination: Secondary | ICD-10-CM

## 2023-05-13 ENCOUNTER — Other Ambulatory Visit: Payer: Self-pay | Admitting: Family Medicine

## 2023-05-13 DIAGNOSIS — E1159 Type 2 diabetes mellitus with other circulatory complications: Secondary | ICD-10-CM

## 2023-06-03 ENCOUNTER — Other Ambulatory Visit: Payer: Self-pay | Admitting: Family Medicine

## 2023-06-03 DIAGNOSIS — E1165 Type 2 diabetes mellitus with hyperglycemia: Secondary | ICD-10-CM

## 2023-06-22 ENCOUNTER — Encounter: Payer: Self-pay | Admitting: Family Medicine

## 2023-06-22 ENCOUNTER — Ambulatory Visit (INDEPENDENT_AMBULATORY_CARE_PROVIDER_SITE_OTHER): Payer: Medicare Other | Admitting: Family Medicine

## 2023-06-22 VITALS — BP 125/64 | HR 72 | Temp 98.3°F | Ht 73.0 in | Wt 271.1 lb

## 2023-06-22 DIAGNOSIS — N401 Enlarged prostate with lower urinary tract symptoms: Secondary | ICD-10-CM

## 2023-06-22 DIAGNOSIS — Z7984 Long term (current) use of oral hypoglycemic drugs: Secondary | ICD-10-CM | POA: Diagnosis not present

## 2023-06-22 DIAGNOSIS — E1169 Type 2 diabetes mellitus with other specified complication: Secondary | ICD-10-CM

## 2023-06-22 DIAGNOSIS — E1165 Type 2 diabetes mellitus with hyperglycemia: Secondary | ICD-10-CM

## 2023-06-22 DIAGNOSIS — E1159 Type 2 diabetes mellitus with other circulatory complications: Secondary | ICD-10-CM | POA: Diagnosis not present

## 2023-06-22 DIAGNOSIS — N138 Other obstructive and reflux uropathy: Secondary | ICD-10-CM

## 2023-06-22 DIAGNOSIS — I152 Hypertension secondary to endocrine disorders: Secondary | ICD-10-CM

## 2023-06-22 DIAGNOSIS — E785 Hyperlipidemia, unspecified: Secondary | ICD-10-CM

## 2023-06-22 DIAGNOSIS — D649 Anemia, unspecified: Secondary | ICD-10-CM

## 2023-06-22 DIAGNOSIS — N1831 Chronic kidney disease, stage 3a: Secondary | ICD-10-CM

## 2023-06-22 LAB — CBC WITH DIFFERENTIAL/PLATELET
Basophils Absolute: 0 10*3/uL (ref 0.0–0.2)
Basos: 1 %
EOS (ABSOLUTE): 0.1 10*3/uL (ref 0.0–0.4)
Eos: 2 %
Hematocrit: 36.3 % — ABNORMAL LOW (ref 37.5–51.0)
Hemoglobin: 12.1 g/dL — ABNORMAL LOW (ref 13.0–17.7)
Immature Grans (Abs): 0 10*3/uL (ref 0.0–0.1)
Immature Granulocytes: 1 %
Lymphocytes Absolute: 1.3 10*3/uL (ref 0.7–3.1)
Lymphs: 35 %
MCH: 30.3 pg (ref 26.6–33.0)
MCHC: 33.3 g/dL (ref 31.5–35.7)
MCV: 91 fL (ref 79–97)
Monocytes Absolute: 0.3 10*3/uL (ref 0.1–0.9)
Monocytes: 7 %
Neutrophils Absolute: 2 10*3/uL (ref 1.4–7.0)
Neutrophils: 54 %
Platelets: 233 10*3/uL (ref 150–450)
RBC: 3.99 x10E6/uL — ABNORMAL LOW (ref 4.14–5.80)
RDW: 13.6 % (ref 11.6–15.4)
WBC: 3.7 10*3/uL (ref 3.4–10.8)

## 2023-06-22 LAB — BAYER DCA HB A1C WAIVED: HB A1C (BAYER DCA - WAIVED): 7 % — ABNORMAL HIGH (ref 4.8–5.6)

## 2023-06-22 MED ORDER — AMLODIPINE BESYLATE 10 MG PO TABS
10.0000 mg | ORAL_TABLET | Freq: Every day | ORAL | 3 refills | Status: DC
Start: 2023-06-22 — End: 2024-08-31

## 2023-06-22 MED ORDER — ATORVASTATIN CALCIUM 80 MG PO TABS
80.0000 mg | ORAL_TABLET | Freq: Every day | ORAL | 3 refills | Status: DC
Start: 2023-06-22 — End: 2024-07-26

## 2023-06-22 MED ORDER — METFORMIN HCL ER 500 MG PO TB24
ORAL_TABLET | ORAL | 3 refills | Status: DC
Start: 2023-06-22 — End: 2024-01-21

## 2023-06-22 NOTE — Progress Notes (Signed)
Established Patient Office Visit  Subjective:  Patient ID: Jack Cherry, male    DOB: 07/15/1956  Age: 67 y.o. MRN: [id number]  CC:  Chief Complaint  Patient presents with   Medical Management of Chronic Issues   Diabetes   Hypertension   Hyperlipidemia    HPI Jack Cherry presents for chronic follow up.  1. DM Patient denies foot ulcerations, paresthesia of the feet, visual disturbances, vomiting and weight loss.  Current diabetic medications include metformin 1000 mg daily, glipizide 5 mg BID Compliant with meds - Yes   Current monitoring regimen: none Any episodes of hypoglycemia? no  Is He on ACE inhibitor or angiotensin II receptor blocker?  Yes, losartan Is He on statin? Yes lipitor Is He on ASA 81 mg daily?  No  2. HTN Complaint with meds - Yes Current Medications - norvasc, losartan.  Pertinent ROS:  Headache - No Fatigue - No Visual Disturbances - No Chest pain - No Dyspnea - No Palpitations - No LE edema - No  3. HLD On statin. Regular diet. No exercise.    BP Readings from Last 3 Encounters:  06/22/23 125/64  03/20/23 (!) 153/76  03/03/23 (!) 161/98      Latest Ref Rng & Units 03/20/2023    9:00 AM 12/22/2022    8:59 AM 06/19/2022    8:41 AM  CMP  Glucose 70 - 99 mg/dL 119  147  829   BUN 8 - 27 mg/dL 16  13  23    Creatinine 0.76 - 1.27 mg/dL 5.62  1.30  8.65   Sodium 134 - 144 mmol/L 142  139  141   Potassium 3.5 - 5.2 mmol/L 4.1  4.4  3.7   Chloride 96 - 106 mmol/L 108  102  103   CO2 20 - 29 mmol/L 21  22  20    Calcium 8.6 - 10.2 mg/dL 9.3  9.5  9.9   Total Protein 6.0 - 8.5 g/dL 6.8  7.1  7.3   Total Bilirubin 0.0 - 1.2 mg/dL 0.3  0.3  0.4   Alkaline Phos 44 - 121 IU/L 70  80  76   AST 0 - 40 IU/L 21  23  20    ALT 0 - 44 IU/L 17  22  19        Past Medical History:  Diagnosis Date   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 Darrick Penna three 3-8 mm pol;yps, simple adenomas. internal hemorrhoids. next colonoscopy in 3 years   ESOPHAGOGASTRODUODENOSCOPY N/A 01/08/2018   Dr. Darrick Penna: normal esophagus, mild gastritis/duodenitis, H.pylori   GIVENS CAPSULE STUDY N/A 12/21/2018   Procedure: GIVENS CAPSULE STUDY;  Surgeon: West Bali, MD;  Location: [facility];  Service: Endoscopy;  Laterality: N/A;  7:30am   HERNIA REPAIR      Family History  Problem Relation Age of Onset   Cancer Mother        not sure what kind   Heart disease Father 54    Social History   Socioeconomic History   Marital status: Single    Spouse name: Not on file   Number of children: 0   Years of education: 9   Highest education level: 9th grade  Occupational History   Not on  file  Tobacco Use   Smoking status: Never   Smokeless tobacco: Never  Vaping Use   Vaping status: Never Used  Substance and Sexual Activity   Alcohol use: No   Drug use: Not Currently    Comment: last use of Marijuana 20 years ago   Sexual activity: Not Currently  Other Topics Concern   Not on file  Social History Narrative   Lives with nephew   Social Determinants of Health   Financial Resource Strain: Low Risk  (04/07/2023)   Overall Financial Resource Strain (CARDIA)    Difficulty of Paying Living Expenses: Not hard at all  Food Insecurity: No Food Insecurity (04/07/2023)   Hunger Vital Sign    Worried About Running Out of Food in the Last Year: Never true    Ran Out of Food in the Last Year: Never true  Transportation Needs: No Transportation Needs (04/07/2023)   PRAPARE - Administrator, Civil Service (Medical): No    Lack of Transportation (Non-Medical): No  Physical Activity: Insufficiently Active (04/07/2023)   Exercise Vital Sign    Days of Exercise per Week: 3 days    Minutes of Exercise per Session: 30 min  Stress: No Stress Concern Present  (04/07/2023)   [employer] of Occupational Health - Occupational Stress Questionnaire    Feeling of Stress : Not at all  Social Connections: Socially Isolated (04/07/2023)   Social Connection and Isolation Panel [NHANES]    Frequency of Communication with Friends and Family: More than three times a week    Frequency of Social Gatherings with Friends and Family: More than three times a week    Attends Religious Services: Never    Database administrator or Organizations: No    Attends Banker Meetings: Never    Marital Status: Never married  Intimate Partner Violence: Not At Risk (04/07/2023)   Humiliation, Afraid, Rape, and Kick questionnaire    Fear of Current or Ex-Partner: No    Emotionally Abused: No    Physically Abused: No    Sexually Abused: No    Outpatient Medications Prior to Visit  Medication Sig Dispense Refill   amLODipine (NORVASC) 10 MG tablet TAKE ONE TABLET ONCE DAILY 90 tablet 0   atorvastatin (LIPITOR) 80 MG tablet Take 1 tablet (80 mg total) by mouth daily. 90 tablet 3   Ferrous Sulfate (IRON PO) Take 36 mg by mouth daily.     glipiZIDE (GLUCOTROL) 5 MG tablet Take 1 tablet (5 mg total) by mouth 2 (two) times daily before a meal. 180 tablet 3   losartan (COZAAR) 100 MG tablet Take 1 tablet (100 mg total) by mouth daily. 90 tablet 3   metFORMIN (GLUCOPHAGE-XR) 500 MG 24 hr tablet TAKE TWO TABLETS BY MOUTH DAILY WITH BREAKFAST 90 tablet 0   tamsulosin (FLOMAX) 0.4 MG CAPS capsule Take 0.4 mg by mouth daily after supper.     tamsulosin (FLOMAX) 0.4 MG CAPS capsule Take 0.4 mg by mouth.     No facility-administered medications prior to visit.    No Known Allergies  ROS Review of Systems Negative unless specially indicated above in HPI.   Objective:    Physical Exam Vitals and nursing note reviewed.  Constitutional:      General: He is not in acute distress.    Appearance: He is obese. He is not ill-appearing, toxic-appearing or diaphoretic.   HENT:     Head: Normocephalic and atraumatic.  Eyes:  General: No scleral icterus.    Extraocular Movements: Extraocular movements intact.     Pupils: Pupils are equal, round, and reactive to light.  Neck:     Vascular: No carotid bruit.  Cardiovascular:     Rate and Rhythm: Normal rate and regular rhythm.     Heart sounds: Murmur heard.  Pulmonary:     Effort: Pulmonary effort is normal. No respiratory distress.     Breath sounds: Normal breath sounds.  Abdominal:     General: Bowel sounds are normal.     Palpations: Abdomen is soft.     Tenderness: There is no abdominal tenderness. There is no guarding or rebound.  Musculoskeletal:     Cervical back: Neck supple. No tenderness.     Right lower leg: No edema.     Left lower leg: No edema.  Skin:    General: Skin is warm and dry.  Neurological:     General: No focal deficit present.     Mental Status: He is alert and oriented to person, place, and time.  Psychiatric:        Mood and Affect: Mood normal.        Behavior: Behavior normal.     BP 125/64   Pulse 72   Temp 98.3 F (36.8 C) (Temporal)   Ht 6\' 1"  (1.854 m)   Wt 271 lb 2 oz (123 kg)   SpO2 95%   BMI 35.77 kg/m  BP Readings from Last 3 Encounters:  06/22/23 125/64  03/20/23 (!) 153/76  03/03/23 (!) 161/98    Diabetic Foot Exam - Simple   No data filed      Health Maintenance Due  Topic Date Due   DTaP/Tdap/Td (1 - Tdap) Never done   OPHTHALMOLOGY EXAM  01/19/2019   Diabetic kidney evaluation - Urine ACR  03/13/2023    There are no preventive care reminders to display for this patient.  Lab Results  Component Value Date   TSH 1.870 11/23/2020   Lab Results  Component Value Date   WBC 3.4 03/20/2023   HGB 11.9 (L) 03/20/2023   HCT 35.5 (L) 03/20/2023   MCV 90 03/20/2023   PLT 220 03/20/2023   Lab Results  Component Value Date   NA 142 03/20/2023   K 4.1 03/20/2023   CO2 21 03/20/2023   GLUCOSE 199 (H) 03/20/2023   BUN 16  03/20/2023   CREATININE 0.98 03/20/2023   BILITOT 0.3 03/20/2023   ALKPHOS 70 03/20/2023   AST 21 03/20/2023   ALT 17 03/20/2023   PROT 6.8 03/20/2023   ALBUMIN 4.3 03/20/2023   CALCIUM 9.3 03/20/2023   ANIONGAP 8 02/14/2020   EGFR 85 03/20/2023   Lab Results  Component Value Date   CHOL 132 12/22/2022   Lab Results  Component Value Date   HDL 41 12/22/2022   Lab Results  Component Value Date   LDLCALC 69 12/22/2022   Lab Results  Component Value Date   TRIG 120 12/22/2022   Lab Results  Component Value Date   CHOLHDL 3.2 12/22/2022   Lab Results  Component Value Date   HGBA1C 7.6 (H) 03/20/2023      Assessment & Plan:   Jack Cherry was seen today for medical management of chronic issues, diabetes, hypertension and hyperlipidemia.  Diagnoses and all orders for this visit:  Type 2 diabetes mellitus with hyperglycemia, without long-term current use of insulin (HCC) A1c 7.0 today, not at goal of <7. Medication changes today: increase  metformin to 1000 mg at breakfast and 500 mg at supper. Continue glipizide. He is on an ACE/ARB and statin. Eye exam: UTD. Foot exam: UTD. Urine micro: UTD. Diet and exercise.  -     Microalbumin / creatinine urine ratio -     Bayer DCA Hb A1c Waived -     metFORMIN (GLUCOPHAGE-XR) 500 MG 24 hr tablet; Take 2 tablets with breakfast daily and 1 tablet with supper.  Long term current use of oral hypoglycemic drug  Hyperlipidemia associated with type 2 diabetes mellitus (HCC) Well controlled on current regimen. On statin.  -     atorvastatin (LIPITOR) 80 MG tablet; Take 1 tablet (80 mg total) by mouth daily.  Hypertension associated with diabetes (HCC) Well controlled on current regimen.  -     amLODipine (NORVASC) 10 MG tablet; Take 1 tablet (10 mg total) by mouth daily.  Normocytic anemia CBC pending. On iron supplement.  -     CBC with Differential/Platelet  BPH with obstruction/lower urinary tract symptoms Established with  urology. On flomax.   CKD Stage 3a Avoid NSAIDs.   Follow-up: Return in about 3 months (around 09/22/2023) for chronic follow up.   The patient indicates understanding of these issues and agrees with the plan.    Gabriel Earing, FNP

## 2023-06-23 LAB — MICROALBUMIN / CREATININE URINE RATIO
Creatinine, Urine: 110.4 mg/dL
Microalb/Creat Ratio: 21 mg/g{creat} (ref 0–29)
Microalbumin, Urine: 23.2 ug/mL

## 2023-07-08 ENCOUNTER — Other Ambulatory Visit: Payer: Self-pay | Admitting: Family Medicine

## 2023-07-15 ENCOUNTER — Other Ambulatory Visit: Payer: Self-pay | Admitting: Cardiology

## 2023-07-15 DIAGNOSIS — I152 Hypertension secondary to endocrine disorders: Secondary | ICD-10-CM

## 2023-07-20 ENCOUNTER — Ambulatory Visit: Payer: Medicare Other

## 2023-07-20 NOTE — Progress Notes (Signed)
Unable to perform exam due to left eye not dilating

## 2023-08-15 ENCOUNTER — Other Ambulatory Visit: Payer: Self-pay | Admitting: Cardiology

## 2023-08-15 DIAGNOSIS — E1159 Type 2 diabetes mellitus with other circulatory complications: Secondary | ICD-10-CM

## 2023-09-11 ENCOUNTER — Telehealth: Payer: Self-pay | Admitting: Family Medicine

## 2023-09-19 ENCOUNTER — Other Ambulatory Visit: Payer: Self-pay | Admitting: Cardiology

## 2023-09-19 DIAGNOSIS — I152 Hypertension secondary to endocrine disorders: Secondary | ICD-10-CM

## 2023-09-23 ENCOUNTER — Ambulatory Visit (INDEPENDENT_AMBULATORY_CARE_PROVIDER_SITE_OTHER): Payer: Medicare Other | Admitting: Family Medicine

## 2023-09-23 ENCOUNTER — Encounter: Payer: Self-pay | Admitting: Family Medicine

## 2023-09-23 VITALS — BP 152/69 | HR 88 | Temp 98.6°F | Ht 73.0 in | Wt 266.0 lb

## 2023-09-23 DIAGNOSIS — E1165 Type 2 diabetes mellitus with hyperglycemia: Secondary | ICD-10-CM

## 2023-09-23 DIAGNOSIS — N1831 Chronic kidney disease, stage 3a: Secondary | ICD-10-CM

## 2023-09-23 DIAGNOSIS — E785 Hyperlipidemia, unspecified: Secondary | ICD-10-CM

## 2023-09-23 DIAGNOSIS — E1169 Type 2 diabetes mellitus with other specified complication: Secondary | ICD-10-CM | POA: Diagnosis not present

## 2023-09-23 DIAGNOSIS — E1159 Type 2 diabetes mellitus with other circulatory complications: Secondary | ICD-10-CM | POA: Diagnosis not present

## 2023-09-23 DIAGNOSIS — Z7984 Long term (current) use of oral hypoglycemic drugs: Secondary | ICD-10-CM

## 2023-09-23 DIAGNOSIS — Z23 Encounter for immunization: Secondary | ICD-10-CM | POA: Diagnosis not present

## 2023-09-23 DIAGNOSIS — R011 Cardiac murmur, unspecified: Secondary | ICD-10-CM

## 2023-09-23 DIAGNOSIS — I152 Hypertension secondary to endocrine disorders: Secondary | ICD-10-CM

## 2023-09-23 LAB — BAYER DCA HB A1C WAIVED: HB A1C (BAYER DCA - WAIVED): 6.1 % — ABNORMAL HIGH (ref 4.8–5.6)

## 2023-09-23 MED ORDER — LOSARTAN POTASSIUM 100 MG PO TABS: 100.0000 mg | ORAL_TABLET | Freq: Every day | ORAL | 0 refills | Status: DC

## 2023-09-23 NOTE — Progress Notes (Addendum)
Established Patient Office Visit  Subjective:  Patient ID: Jack Cherry, male    DOB: [birth year]  Age: 67 y.o. MRN: [id number]  CC:  Chief Complaint  Patient presents with   Medical Management of Chronic Issues   Hyperlipidemia   Diabetes   Hypertension   Chronic Kidney Disease    HPI Jack Cherry presents for chronic follow up.  1. DM Patient denies foot ulcerations, paresthesia of the feet, visual disturbances, vomiting and weight loss.  Current diabetic medications include metformin 1000 mg daily, glipizide 5 mg BID Compliant with meds - Yes   Current monitoring regimen: none Any episodes of hypoglycemia? no  Is He on ACE inhibitor or angiotensin II receptor blocker?  Yes, losartan Is He on statin? Yes lipitor Is He on ASA 81 mg daily?  No  2. HTN Complaint with meds - he has been out of losartan Current Medications - norvasc, losartan.  Pertinent ROS:  Headache - No Fatigue - No Visual Disturbances - No Chest pain - No Dyspnea - No Palpitations - No LE edema - No  3. HLD On statin. Regular diet. No exercise.  He did eat prior to his visit today.   BP Readings from Last 3 Encounters:  06/22/23 125/64  03/20/23 (!) 153/76  03/03/23 (!) 161/98      Latest Ref Rng & Units 03/20/2023    9:00 AM 12/22/2022    8:59 AM 06/19/2022    8:41 AM  CMP  Glucose 70 - 99 mg/dL 865  784  696   BUN 8 - 27 mg/dL 16  13  23    Creatinine 0.76 - 1.27 mg/dL 2.95  2.84  1.32   Sodium 134 - 144 mmol/L 142  139  141   Potassium 3.5 - 5.2 mmol/L 4.1  4.4  3.7   Chloride 96 - 106 mmol/L 108  102  103   CO2 20 - 29 mmol/L 21  22  20    Calcium 8.6 - 10.2 mg/dL 9.3  9.5  9.9   Total Protein 6.0 - 8.5 g/dL 6.8  7.1  7.3   Total Bilirubin 0.0 - 1.2 mg/dL 0.3  0.3  0.4   Alkaline Phos 44 - 121 IU/L 70  80  76   AST 0 - 40 IU/L 21  23  20    ALT 0 - 44 IU/L 17  22  19        Past Medical History:  Diagnosis Date   Amputation finger    partial- end of 2 left fingers  following lawnmover incident   Diabetes (HCC)    High cholesterol    Hypertension     Past Surgical History:  Procedure Laterality Date   COLONOSCOPY N/A 01/08/2018   Dr. Darrick Penna three 3-8 mm pol;yps, simple adenomas. internal hemorrhoids. next colonoscopy in 3 years   ESOPHAGOGASTRODUODENOSCOPY N/A 01/08/2018   Dr. Darrick Penna: normal esophagus, mild gastritis/duodenitis, H.pylori   GIVENS CAPSULE STUDY N/A 12/21/2018   Procedure: GIVENS CAPSULE STUDY;  Surgeon: West Bali, MD;  Location: [facility];  Service: Endoscopy;  Laterality: N/A;  7:30am   HERNIA REPAIR      Family History  Problem Relation Age of Onset   Cancer Mother        not sure what kind   Heart disease Father 61    Social History   Socioeconomic History   Marital status: Single    Spouse name: Not on file   Number of children: 0  Years of education: 9   Highest education level: 9th grade  Occupational History   Not on file  Tobacco Use   Smoking status: Never   Smokeless tobacco: Never  Vaping Use   Vaping status: Never Used  Substance and Sexual Activity   Alcohol use: No   Drug use: Not Currently    Comment: last use of Marijuana 20 years ago   Sexual activity: Not Currently  Other Topics Concern   Not on file  Social History Narrative   Lives with nephew   Social Determinants of Health   Financial Resource Strain: Low Risk  (04/07/2023)   Overall Financial Resource Strain (CARDIA)    Difficulty of Paying Living Expenses: Not hard at all  Food Insecurity: No Food Insecurity (04/07/2023)   Hunger Vital Sign    Worried About Running Out of Food in the Last Year: Never true    Ran Out of Food in the Last Year: Never true  Transportation Needs: No Transportation Needs (04/07/2023)   PRAPARE - Administrator, Civil Service (Medical): No    Lack of Transportation (Non-Medical): No  Physical Activity: Insufficiently Active (04/07/2023)   Exercise Vital Sign    Days of Exercise per Week: 3  days    Minutes of Exercise per Session: 30 min  Stress: No Stress Concern Present (04/07/2023)   [employer] of Occupational Health - Occupational Stress Questionnaire    Feeling of Stress : Not at all  Social Connections: Socially Isolated (04/07/2023)   Social Connection and Isolation Panel [NHANES]    Frequency of Communication with Friends and Family: More than three times a week    Frequency of Social Gatherings with Friends and Family: More than three times a week    Attends Religious Services: Never    Database administrator or Organizations: No    Attends Banker Meetings: Never    Marital Status: Never married  Intimate Partner Violence: Not At Risk (04/07/2023)   Humiliation, Afraid, Rape, and Kick questionnaire    Fear of Current or Ex-Partner: No    Emotionally Abused: No    Physically Abused: No    Sexually Abused: No    Outpatient Medications Prior to Visit  Medication Sig Dispense Refill   amLODipine (NORVASC) 10 MG tablet Take 1 tablet (10 mg total) by mouth daily. 90 tablet 3   atorvastatin (LIPITOR) 80 MG tablet Take 1 tablet (80 mg total) by mouth daily. 90 tablet 3   Ferrous Sulfate (IRON PO) Take 36 mg by mouth daily.     glipiZIDE (GLUCOTROL) 5 MG tablet Take 1 tablet (5 mg total) by mouth 2 (two) times daily before a meal. 180 tablet 3   losartan (COZAAR) 100 MG tablet TAKE 1 TABLET DAILY. NEEDS TO BE SEEN FOR REFILLS 15 tablet 0   metFORMIN (GLUCOPHAGE-XR) 500 MG 24 hr tablet Take 2 tablets with breakfast daily and 1 tablet with supper. 270 tablet 3   tamsulosin (FLOMAX) 0.4 MG CAPS capsule TAKE ONE CAPSULE ONCE DAILY 90 capsule 3   No facility-administered medications prior to visit.    No Known Allergies  ROS Review of Systems Negative unless specially indicated above in HPI.   Objective:    Physical Exam Vitals and nursing note reviewed.  Constitutional:      General: He is not in acute distress.    Appearance: He is obese. He is  not ill-appearing, toxic-appearing or diaphoretic.  HENT:  Head: Normocephalic and atraumatic.  Eyes:     General: No scleral icterus.    Extraocular Movements: Extraocular movements intact.     Pupils: Pupils are equal, round, and reactive to light.  Neck:     Vascular: No carotid bruit.  Cardiovascular:     Rate and Rhythm: Normal rate and regular rhythm.     Heart sounds: Murmur heard.  Pulmonary:     Effort: Pulmonary effort is normal. No respiratory distress.     Breath sounds: Normal breath sounds.  Abdominal:     General: Bowel sounds are normal.     Palpations: Abdomen is soft.     Tenderness: There is no abdominal tenderness. There is no guarding or rebound.  Musculoskeletal:     Cervical back: Neck supple. No tenderness.     Right lower leg: No edema.     Left lower leg: No edema.  Skin:    General: Skin is warm and dry.  Neurological:     General: No focal deficit present.     Mental Status: He is alert and oriented to person, place, and time.  Psychiatric:        Mood and Affect: Mood normal.        Behavior: Behavior normal.     BP (!) 152/69   Pulse 88   Temp 98.6 F (37 C) (Temporal)   Ht 6\' 1"  (1.854 m)   Wt 266 lb (120.7 kg)   SpO2 95%   BMI 35.09 kg/m  BP Readings from Last 3 Encounters:  06/22/23 125/64  03/20/23 (!) 153/76  03/03/23 (!) 161/98    Health Maintenance Due  Topic Date Due   DTaP/Tdap/Td (1 - Tdap) Never done   OPHTHALMOLOGY EXAM  01/19/2019   COVID-19 Vaccine (3 - 2023-24 season) 07/05/2023    There are no preventive care reminders to display for this patient.  Lab Results  Component Value Date   TSH 1.870 11/23/2020   Lab Results  Component Value Date   WBC 3.7 06/22/2023   HGB 12.1 (L) 06/22/2023   HCT 36.3 (L) 06/22/2023   MCV 91 06/22/2023   PLT 233 06/22/2023   Lab Results  Component Value Date   NA 142 03/20/2023   K 4.1 03/20/2023   CO2 21 03/20/2023   GLUCOSE 199 (H) 03/20/2023   BUN 16 03/20/2023    CREATININE 0.98 03/20/2023   BILITOT 0.3 03/20/2023   ALKPHOS 70 03/20/2023   AST 21 03/20/2023   ALT 17 03/20/2023   PROT 6.8 03/20/2023   ALBUMIN 4.3 03/20/2023   CALCIUM 9.3 03/20/2023   ANIONGAP 8 02/14/2020   EGFR 85 03/20/2023   Lab Results  Component Value Date   CHOL 132 12/22/2022   Lab Results  Component Value Date   HDL 41 12/22/2022   Lab Results  Component Value Date   LDLCALC 69 12/22/2022   Lab Results  Component Value Date   TRIG 120 12/22/2022   Lab Results  Component Value Date   CHOLHDL 3.2 12/22/2022   Lab Results  Component Value Date   HGBA1C 7.0 (H) 06/22/2023      Assessment & Plan:   Jack Cherry was seen today for medical management of chronic issues, hyperlipidemia, diabetes, hypertension and chronic kidney disease.  Diagnoses and all orders for this visit:  Type 2 diabetes mellitus with hyperglycemia, without long-term current use of insulin (HCC) A1c 6.1 today, at goal of <7. Medication changes today: none, continue metformin. He is on  an ACE/ARB and statin. Eye exam: reminded to schedule. Foot exam: UTD. Urine micro: UTD. Diet and exercise.  -     Vitamin B12 -     Bayer DCA Hb A1c Waived  Hyperlipidemia associated with type 2 diabetes mellitus (HCC) On statin.  -     Lipid panel  Hypertension associated with diabetes (HCC) BP not at goal. He has been out of losartan. Refill provided today. Will have him follow up in 2 weeks for recheck.  -     CBC with Differential/Platelet -     losartan (COZAAR) 100 MG tablet; Take 1 tablet (100 mg total) by mouth daily.  Morbid obesity (HCC) Diet, exercise, weight loss.   Chronic kidney disease, stage 3a (HCC) Avoid NSAIDs. Labs pending.  -     CMP14+EGFR -     VITAMIN D 25 Hydroxy (Vit-D Deficiency, Fractures)  Heart murmur Asymptomatic. Schedule follow up with cardiology.   Flu vaccine today.   Follow-up: Return in about 2 weeks (around 10/07/2023) for BP check.   The patient  indicates understanding of these issues and agrees with the plan.    Gabriel Earing, FNP

## 2023-09-23 NOTE — Addendum Note (Signed)
Addended by: Gabriel Earing on: 09/23/2023 10:20 AM   Modules accepted: Level of Service

## 2023-09-24 ENCOUNTER — Other Ambulatory Visit: Payer: Self-pay | Admitting: Family Medicine

## 2023-09-24 DIAGNOSIS — E559 Vitamin D deficiency, unspecified: Secondary | ICD-10-CM | POA: Insufficient documentation

## 2023-09-24 LAB — CMP14+EGFR
ALT: 18 [IU]/L (ref 0–44)
AST: 17 [IU]/L (ref 0–40)
Albumin: 4.3 g/dL (ref 3.9–4.9)
Alkaline Phosphatase: 80 [IU]/L (ref 44–121)
BUN/Creatinine Ratio: 15 (ref 10–24)
BUN: 15 mg/dL (ref 8–27)
Bilirubin Total: 0.3 mg/dL (ref 0.0–1.2)
CO2: 23 mmol/L (ref 20–29)
Calcium: 9.5 mg/dL (ref 8.6–10.2)
Chloride: 104 mmol/L (ref 96–106)
Creatinine, Ser: 1.01 mg/dL (ref 0.76–1.27)
Globulin, Total: 2.4 g/dL (ref 1.5–4.5)
Glucose: 176 mg/dL — ABNORMAL HIGH (ref 70–99)
Potassium: 5.1 mmol/L (ref 3.5–5.2)
Sodium: 141 mmol/L (ref 134–144)
Total Protein: 6.7 g/dL (ref 6.0–8.5)
eGFR: 81 mL/min/{1.73_m2} (ref >59)

## 2023-09-24 LAB — CBC WITH DIFFERENTIAL/PLATELET
Basophils Absolute: 0 10*3/uL (ref 0.0–0.2)
Basos: 1 %
EOS (ABSOLUTE): 0.1 10*3/uL (ref 0.0–0.4)
Eos: 2 %
Hematocrit: 37.2 % — ABNORMAL LOW (ref 37.5–51.0)
Hemoglobin: 12.1 g/dL — ABNORMAL LOW (ref 13.0–17.7)
Immature Grans (Abs): 0 10*3/uL (ref 0.0–0.1)
Immature Granulocytes: 1 %
Lymphocytes Absolute: 1.3 10*3/uL (ref 0.7–3.1)
Lymphs: 32 %
MCH: 30.6 pg (ref 26.6–33.0)
MCHC: 32.5 g/dL (ref 31.5–35.7)
MCV: 94 fL (ref 79–97)
Monocytes Absolute: 0.3 10*3/uL (ref 0.1–0.9)
Monocytes: 7 %
Neutrophils Absolute: 2.3 10*3/uL (ref 1.4–7.0)
Neutrophils: 57 %
Platelets: 233 10*3/uL (ref 150–450)
RBC: 3.95 x10E6/uL — ABNORMAL LOW (ref 4.14–5.80)
RDW: 13.5 % (ref 11.6–15.4)
WBC: 4 10*3/uL (ref 3.4–10.8)

## 2023-09-24 LAB — LIPID PANEL
Chol/HDL Ratio: 3 ratio (ref 0.0–5.0)
Cholesterol, Total: 111 mg/dL (ref 100–199)
HDL: 37 mg/dL — ABNORMAL LOW (ref >39)
LDL Chol Calc (NIH): 54 mg/dL (ref 0–99)
Triglycerides: 104 mg/dL (ref 0–149)
VLDL Cholesterol Cal: 20 mg/dL (ref 5–40)

## 2023-09-24 LAB — VITAMIN B12: Vitamin B-12: 456 pg/mL (ref 232–1245)

## 2023-09-24 LAB — VITAMIN D 25 HYDROXY (VIT D DEFICIENCY, FRACTURES): Vit D, 25-Hydroxy: 26.7 ng/mL — ABNORMAL LOW (ref 30.0–100.0)

## 2023-09-24 MED ORDER — VITAMIN D (ERGOCALCIFEROL) 1.25 MG (50000 UNIT) PO CAPS
50000.0000 [IU] | ORAL_CAPSULE | ORAL | 0 refills | Status: DC
Start: 2023-09-24 — End: 2024-01-21

## 2023-09-26 LAB — IRON AND TIBC

## 2023-09-26 LAB — SPECIMEN STATUS REPORT

## 2023-09-26 LAB — FOLATE: Folate: 7.8 ng/mL (ref >3.0)

## 2023-10-16 ENCOUNTER — Encounter: Payer: Self-pay | Admitting: Family Medicine

## 2023-10-16 ENCOUNTER — Ambulatory Visit (INDEPENDENT_AMBULATORY_CARE_PROVIDER_SITE_OTHER): Payer: Medicare Other | Admitting: Family Medicine

## 2023-10-16 VITALS — BP 129/69 | HR 76 | Temp 98.2°F | Ht 73.0 in | Wt 263.2 lb

## 2023-10-16 DIAGNOSIS — E1159 Type 2 diabetes mellitus with other circulatory complications: Secondary | ICD-10-CM | POA: Diagnosis not present

## 2023-10-16 DIAGNOSIS — I152 Hypertension secondary to endocrine disorders: Secondary | ICD-10-CM | POA: Diagnosis not present

## 2023-10-16 DIAGNOSIS — I350 Nonrheumatic aortic (valve) stenosis: Secondary | ICD-10-CM | POA: Diagnosis not present

## 2023-10-16 NOTE — Progress Notes (Signed)
   Established Patient Office Visit  Subjective   Patient ID: Jack Cherry, male    DOB: 09-Aug-1956  Age: 67 y.o. MRN: [id number]  Chief Complaint  Patient presents with   Hypertension    Hypertension    HTN 2 week follow up for BP check. Last BP was elevated, however he had been out of losartan for a few days. He has been compliant with medications since his last visit. Denies chest pain, shortness of breath, dizziness, edema.   2. Aortic valve stenosis Overdue for follow up with cardiology. Hasn't scheduled an appt. Last echo with mildly severe aortic valve stenosis. Asymptomatic.     ROS As per HPI.    Objective:     BP 129/69   Pulse 76   Temp 98.2 F (36.8 C) (Temporal)   Ht 6\' 1"  (1.854 m)   Wt 263 lb 4 oz (119.4 kg)   SpO2 95%   BMI 34.73 kg/m    Physical Exam Vitals and nursing note reviewed.  Constitutional:      General: He is not in acute distress.    Appearance: He is obese. He is not ill-appearing, toxic-appearing or diaphoretic.  HENT:     Head: Normocephalic and atraumatic.  Cardiovascular:     Rate and Rhythm: Normal rate and regular rhythm.     Heart sounds: Murmur heard.  Pulmonary:     Effort: Pulmonary effort is normal. No respiratory distress.     Breath sounds: Normal breath sounds.  Abdominal:     General: Bowel sounds are normal.     Palpations: Abdomen is soft.     Tenderness: There is no abdominal tenderness. There is no guarding or rebound.  Musculoskeletal:     Right lower leg: No edema.     Left lower leg: No edema.  Skin:    General: Skin is warm and dry.  Neurological:     General: No focal deficit present.     Mental Status: He is alert and oriented to person, place, and time.  Psychiatric:        Mood and Affect: Mood normal.        Behavior: Behavior normal.      No results found for any visits on 10/16/23.    The ASCVD Risk score (Arnett DK, et al., 2019) failed to calculate for the following reasons:    The valid total cholesterol range is 130 to 320 mg/dL    Assessment & Plan:   Jack Cherry was seen today for hypertension.  Diagnoses and all orders for this visit:  Hypertension associated with diabetes (HCC) Well controlled on current regimen.   Nonrheumatic aortic valve stenosis Asymptomatic. Referral back to cardiology as he is overdue for follow up.  -     Ambulatory referral to Cardiology   Return in about 3 months (around 01/14/2024).   The patient indicates understanding of these issues and agrees with the plan.  Gabriel Earing, FNP

## 2023-11-09 ENCOUNTER — Encounter: Payer: Self-pay | Admitting: *Deleted

## 2023-12-24 ENCOUNTER — Other Ambulatory Visit: Payer: Self-pay | Admitting: Family Medicine

## 2023-12-24 DIAGNOSIS — E1159 Type 2 diabetes mellitus with other circulatory complications: Secondary | ICD-10-CM

## 2024-01-15 ENCOUNTER — Ambulatory Visit: Payer: Medicare Other | Admitting: Family Medicine

## 2024-01-21 ENCOUNTER — Ambulatory Visit (INDEPENDENT_AMBULATORY_CARE_PROVIDER_SITE_OTHER): Payer: Medicare Other | Admitting: Family Medicine

## 2024-01-21 ENCOUNTER — Encounter: Payer: Self-pay | Admitting: Family Medicine

## 2024-01-21 VITALS — BP 141/64 | HR 74 | Temp 98.2°F | Ht 73.0 in | Wt 269.6 lb

## 2024-01-21 DIAGNOSIS — E1169 Type 2 diabetes mellitus with other specified complication: Secondary | ICD-10-CM | POA: Diagnosis not present

## 2024-01-21 DIAGNOSIS — E1165 Type 2 diabetes mellitus with hyperglycemia: Secondary | ICD-10-CM | POA: Diagnosis not present

## 2024-01-21 DIAGNOSIS — E1159 Type 2 diabetes mellitus with other circulatory complications: Secondary | ICD-10-CM | POA: Diagnosis not present

## 2024-01-21 DIAGNOSIS — Z7984 Long term (current) use of oral hypoglycemic drugs: Secondary | ICD-10-CM | POA: Diagnosis not present

## 2024-01-21 DIAGNOSIS — N401 Enlarged prostate with lower urinary tract symptoms: Secondary | ICD-10-CM

## 2024-01-21 DIAGNOSIS — I152 Hypertension secondary to endocrine disorders: Secondary | ICD-10-CM

## 2024-01-21 DIAGNOSIS — N138 Other obstructive and reflux uropathy: Secondary | ICD-10-CM | POA: Diagnosis not present

## 2024-01-21 DIAGNOSIS — R011 Cardiac murmur, unspecified: Secondary | ICD-10-CM | POA: Diagnosis not present

## 2024-01-21 DIAGNOSIS — E785 Hyperlipidemia, unspecified: Secondary | ICD-10-CM

## 2024-01-21 DIAGNOSIS — N1831 Chronic kidney disease, stage 3a: Secondary | ICD-10-CM

## 2024-01-21 LAB — BAYER DCA HB A1C WAIVED: HB A1C (BAYER DCA - WAIVED): 7.2 % — ABNORMAL HIGH (ref 4.8–5.6)

## 2024-01-21 MED ORDER — METFORMIN HCL 1000 MG PO TABS: 1000.0000 mg | ORAL_TABLET | Freq: Two times a day (BID) | ORAL | 3 refills | Status: AC

## 2024-01-21 MED ORDER — METFORMIN HCL ER (MOD) 1000 MG PO TB24: 1000.0000 mg | ORAL_TABLET | Freq: Two times a day (BID) | ORAL | 3 refills | Status: DC

## 2024-01-21 MED ORDER — HYDROCHLOROTHIAZIDE 25 MG PO TABS: 12.5000 mg | ORAL_TABLET | Freq: Every day | ORAL | 3 refills | Status: AC

## 2024-01-21 NOTE — Addendum Note (Signed)
 Addended by: Gabriel Earing on: 01/21/2024 08:32 AM   Modules accepted: Orders

## 2024-01-21 NOTE — Addendum Note (Signed)
 Addended by: Gabriel Earing on: 01/21/2024 04:44 PM   Modules accepted: Level of Service

## 2024-01-21 NOTE — Assessment & Plan Note (Signed)
 On ARB. Avoid NSAIDs. Unable to afford SGLT2.

## 2024-01-21 NOTE — Progress Notes (Addendum)
 Established Patient Office Visit  Subjective:  Patient ID: Jack Cherry, male    DOB: 1956-02-09  Age: 68 y.o. MRN: [id number]  CC:  Chief Complaint  Patient presents with   Medical Management of Chronic Issues    HPI Jack Cherry presents for chronic follow up.  1. DM Patient denies foot ulcerations, paresthesia of the feet, visual disturbances, vomiting and weight loss.  Current diabetic medications include metformin 1500 mg daily, glipizide 5 mg BID Compliant with meds - Yes   Current monitoring regimen: none  Is He on ACE inhibitor or angiotensin II receptor blocker?  Yes, losartan Is He on statin? Yes lipitor Is He on ASA 81 mg daily?  No  2. HTN Complaint with meds - yes Current Medications - norvasc, losartan.  Pertinent ROS:  Headache - No Fatigue - No Visual Disturbances - No Chest pain - No Dyspnea - No Palpitations - No LE edema - No  3. HLD On statin. Regular diet. No exercise.    BP Readings from Last 3 Encounters:  01/21/24 (!) 141/64  10/16/23 129/69  09/23/23 (!) 152/69      Latest Ref Rng & Units 09/23/2023    8:18 AM 03/20/2023    9:00 AM 12/22/2022    8:59 AM  CMP  Glucose 70 - 99 mg/dL 952  841  324   BUN 8 - 27 mg/dL 15  16  13    Creatinine 0.76 - 1.27 mg/dL 4.01  0.27  2.53   Sodium 134 - 144 mmol/L 141  142  139   Potassium 3.5 - 5.2 mmol/L 5.1  4.1  4.4   Chloride 96 - 106 mmol/L 104  108  102   CO2 20 - 29 mmol/L 23  21  22    Calcium 8.6 - 10.2 mg/dL 9.5  9.3  9.5   Total Protein 6.0 - 8.5 g/dL 6.7  6.8  7.1   Total Bilirubin 0.0 - 1.2 mg/dL 0.3  0.3  0.3   Alkaline Phos 44 - 121 IU/L 80  70  80   AST 0 - 40 IU/L 17  21  23    ALT 0 - 44 IU/L 18  17  22        Past Medical History:  Diagnosis Date   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 Darrick Penna three 3-8 mm pol;yps, simple adenomas. internal hemorrhoids. next colonoscopy in 3 years   ESOPHAGOGASTRODUODENOSCOPY N/A 01/08/2018   Dr. Darrick Penna: normal esophagus, mild gastritis/duodenitis, H.pylori   GIVENS CAPSULE STUDY N/A 12/21/2018   Procedure: GIVENS CAPSULE STUDY;  Surgeon: West Bali, MD;  Location: [facility];  Service: Endoscopy;  Laterality: N/A;  7:30am   HERNIA REPAIR      Family History  Problem Relation Age of Onset   Cancer Mother        not sure what kind   Heart disease Father 25    Social History   Socioeconomic History   Marital status: Single    Spouse name: Not on file   Number of children: 0   Years of education: 9   Highest education level: 9th grade  Occupational History   Not on file  Tobacco Use   Smoking status: Never   Smokeless tobacco: Never  Vaping Use   Vaping status: Never Used  Substance and Sexual Activity   Alcohol use: No   Drug use: Not Currently    Comment: last use of Marijuana 20 years ago   Sexual activity: Not Currently  Other Topics Concern   Not on file  Social History Narrative   Lives with nephew   Social Drivers of Health   Financial Resource Strain: Low Risk  (04/07/2023)   Overall Financial Resource Strain (CARDIA)    Difficulty of Paying Living Expenses: Not hard at all  Food Insecurity: No Food Insecurity (04/07/2023)   Hunger Vital Sign    Worried About Running Out of Food in the Last Year: Never true    Ran Out of Food in the Last Year: Never true  Transportation Needs: No Transportation Needs (04/07/2023)   PRAPARE - Administrator, Civil Service (Medical): No    Lack of Transportation (Non-Medical): No  Physical Activity: Insufficiently Active (04/07/2023)   Exercise Vital Sign    Days of Exercise per Week: 3 days    Minutes of Exercise per Session: 30 min  Stress: No Stress Concern Present (04/07/2023)   [employer] of Occupational Health - Occupational Stress Questionnaire     Feeling of Stress : Not at all  Social Connections: Socially Isolated (04/07/2023)   Social Connection and Isolation Panel [NHANES]    Frequency of Communication with Friends and Family: More than three times a week    Frequency of Social Gatherings with Friends and Family: More than three times a week    Attends Religious Services: Never    Database administrator or Organizations: No    Attends Banker Meetings: Never    Marital Status: Never married  Intimate Partner Violence: Not At Risk (04/07/2023)   Humiliation, Afraid, Rape, and Kick questionnaire    Fear of Current or Ex-Partner: No    Emotionally Abused: No    Physically Abused: No    Sexually Abused: No    Outpatient Medications Prior to Visit  Medication Sig Dispense Refill   amLODipine (NORVASC) 10 MG tablet Take 1 tablet (10 mg total) by mouth daily. 90 tablet 3   atorvastatin (LIPITOR) 80 MG tablet Take 1 tablet (80 mg total) by mouth daily. 90 tablet 3   glipiZIDE (GLUCOTROL) 5 MG tablet Take 1 tablet (5 mg total) by mouth 2 (two) times daily before a meal. 180 tablet 3   losartan (COZAAR) 100 MG tablet TAKE ONE TABLET DAILY 90 tablet 0   metFORMIN (GLUCOPHAGE-XR) 500 MG 24 hr tablet Take 2 tablets with breakfast daily and 1 tablet with supper. 270 tablet 3   tamsulosin (FLOMAX) 0.4 MG CAPS capsule TAKE ONE CAPSULE ONCE DAILY 90 capsule 3   Ferrous Sulfate (IRON PO) Take 36 mg by mouth daily. (Patient not taking: Reported on 01/21/2024)     Vitamin D, Ergocalciferol, (DRISDOL) 1.25 MG (50000 UNIT) CAPS capsule Take 1 capsule (50,000 Units total) by mouth every 7 (seven) days. 8 capsule 0   No facility-administered medications prior to visit.    No Known Allergies  ROS Review of Systems Negative unless specially indicated above in HPI.   Objective:    Physical Exam Vitals and nursing note reviewed.  Constitutional:      General: He is not in acute distress.    Appearance: He is obese. He is not  ill-appearing, toxic-appearing or diaphoretic.  HENT:     Head: Normocephalic and atraumatic.  Eyes:  General: No scleral icterus.    Extraocular Movements: Extraocular movements intact.     Pupils: Pupils are equal, round, and reactive to light.  Neck:     Vascular: No carotid bruit.  Cardiovascular:     Rate and Rhythm: Normal rate and regular rhythm.     Heart sounds: Murmur heard.  Pulmonary:     Effort: Pulmonary effort is normal. No respiratory distress.     Breath sounds: Normal breath sounds.  Abdominal:     General: Bowel sounds are normal.     Palpations: Abdomen is soft.     Tenderness: There is no abdominal tenderness. There is no guarding or rebound.  Musculoskeletal:     Cervical back: Neck supple. No tenderness.     Right lower leg: No edema.     Left lower leg: No edema.  Skin:    General: Skin is warm and dry.  Neurological:     General: No focal deficit present.     Mental Status: He is alert and oriented to person, place, and time.  Psychiatric:        Mood and Affect: Mood normal.        Behavior: Behavior normal.     BP (!) 141/64   Pulse 74   Temp 98.2 F (36.8 C) (Temporal)   Ht 6\' 1"  (1.854 m)   Wt 269 lb 9.6 oz (122.3 kg)   SpO2 96%   BMI 35.57 kg/m  BP Readings from Last 3 Encounters:  01/21/24 (!) 141/64  10/16/23 129/69  09/23/23 (!) 152/69    Health Maintenance Due  Topic Date Due   OPHTHALMOLOGY EXAM  01/19/2019   FOOT EXAM  12/23/2023    There are no preventive care reminders to display for this patient.  Lab Results  Component Value Date   TSH 1.870 11/23/2020   Lab Results  Component Value Date   WBC 4.0 09/23/2023   HGB 12.1 (L) 09/23/2023   HCT 37.2 (L) 09/23/2023   MCV 94 09/23/2023   PLT 233 09/23/2023   Lab Results  Component Value Date   NA 141 09/23/2023   K 5.1 09/23/2023   CO2 23 09/23/2023   GLUCOSE 176 (H) 09/23/2023   BUN 15 09/23/2023   CREATININE 1.01 09/23/2023   BILITOT 0.3 09/23/2023    ALKPHOS 80 09/23/2023   AST 17 09/23/2023   ALT 18 09/23/2023   PROT 6.7 09/23/2023   ALBUMIN 4.3 09/23/2023   CALCIUM 9.5 09/23/2023   ANIONGAP 8 02/14/2020   EGFR 81 09/23/2023   Lab Results  Component Value Date   CHOL 111 09/23/2023   Lab Results  Component Value Date   HDL 37 (L) 09/23/2023   Lab Results  Component Value Date   LDLCALC 54 09/23/2023   Lab Results  Component Value Date   TRIG 104 09/23/2023   Lab Results  Component Value Date   CHOLHDL 3.0 09/23/2023   Lab Results  Component Value Date   HGBA1C 6.1 (H) 09/23/2023      Assessment & Plan:   Type 2 diabetes mellitus with hyperglycemia, without long-term current use of insulin (HCC) Assessment & Plan: A1c 7.2 today, not goal of <7. Medication changes today: increase metformin to 1000 mg BID. Continue glipizide. He is on an ACE/ARB and statin.  Diet and exercise.    Orders: -     Bayer DCA Hb A1c Waived -     metFORMIN HCl; Take 1 tablet (1,000 mg total) by  mouth 2 (two) times daily with a meal.  Dispense: 180 tablet; Refill: 3  Long term current use of oral hypoglycemic drug  Hyperlipidemia associated with type 2 diabetes mellitus (HCC) Assessment & Plan: On statin. Last LDL at 54.   Hypertension associated with diabetes (HCC) Assessment & Plan: BP not at goal today. Add hydrochlorothiazide. Continue losartan and amlodipine.   Orders: -     hydroCHLOROthiazide; Take 0.5 tablets (12.5 mg total) by mouth daily.  Dispense: 90 tablet; Refill: 3  Morbid obesity (HCC) Assessment & Plan: Diet, exercise, weight loss. BMI 35 with T2DM, HLD, HTN.    Chronic kidney disease, stage 3a [facility]) Assessment & Plan: On ARB. Avoid NSAIDs. Unable to afford SGLT2.   BPH with obstruction/lower urinary tract symptoms Assessment & Plan: Continue flomax   Heart murmur Assessment & Plan: Denies symptoms. Has declined cardiology follow up.       Follow-up: Return in about 3 months (around  04/22/2024) for chronic follow up.   The patient indicates understanding of these issues and agrees with the plan.    Gabriel Earing, FNP

## 2024-01-21 NOTE — Assessment & Plan Note (Signed)
 BP not at goal today. Add hydrochlorothiazide. Continue losartan and amlodipine.

## 2024-01-21 NOTE — Assessment & Plan Note (Signed)
 A1c 7.2 today, not goal of <7. Medication changes today: increase metformin to 1000 mg BID. Continue glipizide. He is on an ACE/ARB and statin.  Diet and exercise.

## 2024-01-21 NOTE — Assessment & Plan Note (Signed)
 On statin. Last LDL at 54.

## 2024-01-21 NOTE — Assessment & Plan Note (Signed)
 Diet, exercise, weight loss. BMI 35 with T2DM, HLD, HTN.

## 2024-01-21 NOTE — Assessment & Plan Note (Signed)
 Denies symptoms. Has declined cardiology follow up.

## 2024-01-21 NOTE — Assessment & Plan Note (Signed)
 Continue flomax

## 2024-03-22 ENCOUNTER — Telehealth: Payer: Self-pay | Admitting: Family Medicine

## 2024-03-30 ENCOUNTER — Other Ambulatory Visit: Payer: Self-pay | Admitting: Family Medicine

## 2024-03-30 DIAGNOSIS — E1159 Type 2 diabetes mellitus with other circulatory complications: Secondary | ICD-10-CM

## 2024-04-22 ENCOUNTER — Ambulatory Visit (INDEPENDENT_AMBULATORY_CARE_PROVIDER_SITE_OTHER): Admitting: Family Medicine

## 2024-04-22 ENCOUNTER — Encounter: Payer: Self-pay | Admitting: Family Medicine

## 2024-04-22 VITALS — BP 137/78 | HR 77 | Temp 97.9°F | Ht 73.0 in | Wt 267.2 lb

## 2024-04-22 DIAGNOSIS — E559 Vitamin D deficiency, unspecified: Secondary | ICD-10-CM | POA: Diagnosis not present

## 2024-04-22 DIAGNOSIS — I350 Nonrheumatic aortic (valve) stenosis: Secondary | ICD-10-CM | POA: Diagnosis not present

## 2024-04-22 DIAGNOSIS — N1831 Chronic kidney disease, stage 3a: Secondary | ICD-10-CM | POA: Diagnosis not present

## 2024-04-22 DIAGNOSIS — Z6835 Body mass index (BMI) 35.0-35.9, adult: Secondary | ICD-10-CM

## 2024-04-22 DIAGNOSIS — Z7984 Long term (current) use of oral hypoglycemic drugs: Secondary | ICD-10-CM

## 2024-04-22 DIAGNOSIS — E1165 Type 2 diabetes mellitus with hyperglycemia: Secondary | ICD-10-CM | POA: Diagnosis not present

## 2024-04-22 DIAGNOSIS — E1159 Type 2 diabetes mellitus with other circulatory complications: Secondary | ICD-10-CM

## 2024-04-22 DIAGNOSIS — I152 Hypertension secondary to endocrine disorders: Secondary | ICD-10-CM

## 2024-04-22 DIAGNOSIS — E785 Hyperlipidemia, unspecified: Secondary | ICD-10-CM | POA: Diagnosis not present

## 2024-04-22 DIAGNOSIS — E1169 Type 2 diabetes mellitus with other specified complication: Secondary | ICD-10-CM

## 2024-04-22 DIAGNOSIS — R011 Cardiac murmur, unspecified: Secondary | ICD-10-CM

## 2024-04-22 LAB — BAYER DCA HB A1C WAIVED: HB A1C (BAYER DCA - WAIVED): 6.4 % — ABNORMAL HIGH (ref 4.8–5.6)

## 2024-04-22 MED ORDER — LOSARTAN POTASSIUM 100 MG PO TABS: 100.0000 mg | ORAL_TABLET | Freq: Every day | ORAL | 3 refills | Status: AC

## 2024-04-22 NOTE — Progress Notes (Signed)
 Established Patient Office Visit  Subjective:  Patient ID: Jack Cherry, male    DOB: 02/12/1956  Age: 68 y.o. MRN: [id number]  CC:  Chief Complaint  Patient presents with   Medical Management of Chronic Issues    HPI Jack Cherry presents for chronic follow up.  1. DM Patient denies foot ulcerations, paresthesia of the feet, visual disturbances, vomiting and weight loss.  Current diabetic medications include metformin  1000 mg BID, glipizide  5 mg BID Compliant with meds - Yes   Current monitoring regimen: none  Is He on ACE inhibitor or angiotensin II receptor blocker?  Yes, losartan  Is He on statin? Yes lipitor Is He on ASA 81 mg daily?  No  2. HTN Complaint with meds - yes Current Medications - norvasc , losartan .  Pertinent ROS:  Headache - No Fatigue - No Visual Disturbances - No Chest pain - No Dyspnea - No Palpitations - No LE edema - No  3. HLD On statin. Regular diet. No exercise.    BP Readings from Last 3 Encounters:  04/22/24 137/78  01/21/24 (!) 141/64  10/16/23 129/69      Latest Ref Rng & Units 09/23/2023    8:18 AM 03/20/2023    9:00 AM 12/22/2022    8:59 AM  CMP  Glucose 70 - 99 mg/dL 478  295  621   BUN 8 - 27 mg/dL 15  16  13    Creatinine 0.76 - 1.27 mg/dL 3.08  6.57  8.46   Sodium 134 - 144 mmol/L 141  142  139   Potassium 3.5 - 5.2 mmol/L 5.1  4.1  4.4   Chloride 96 - 106 mmol/L 104  108  102   CO2 20 - 29 mmol/L 23  21  22    Calcium  8.6 - 10.2 mg/dL 9.5  9.3  9.5   Total Protein 6.0 - 8.5 g/dL 6.7  6.8  7.1   Total Bilirubin 0.0 - 1.2 mg/dL 0.3  0.3  0.3   Alkaline Phos 44 - 121 IU/L 80  70  80   AST 0 - 40 IU/L 17  21  23    ALT 0 - 44 IU/L 18  17  22        Past Medical History:  Diagnosis Date   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  Nolene Baumgarten three 3-8 mm pol;yps, simple adenomas. internal hemorrhoids. next colonoscopy in 3 years   ESOPHAGOGASTRODUODENOSCOPY N/A 01/08/2018   Dr. Nolene Baumgarten: normal esophagus, mild gastritis/duodenitis, H.pylori   GIVENS CAPSULE STUDY N/A 12/21/2018   Procedure: GIVENS CAPSULE STUDY;  Surgeon: Alyce Jubilee, MD;  Location: [facility];  Service: Endoscopy;  Laterality: N/A;  7:30am   HERNIA REPAIR      Family History  Problem Relation Age of Onset   Cancer Mother        not sure what kind   Heart disease Father 24    Social History   Socioeconomic History   Marital status: Single    Spouse name: Not on file   Number of children: 0   Years of education: 9   Highest education level: 9th grade  Occupational History   Not on file  Tobacco Use   Smoking status: Never   Smokeless tobacco: Never  Vaping Use   Vaping status: Never Used  Substance and Sexual Activity   Alcohol use: No   Drug use: Not Currently    Comment: last use of Marijuana 20 years ago   Sexual activity: Not Currently  Other Topics Concern   Not on file  Social History Narrative   Lives with nephew   Social Drivers of Health   Financial Resource Strain: Low Risk  (04/07/2023)   Overall Financial Resource Strain (CARDIA)    Difficulty of Paying Living Expenses: Not hard at all  Food Insecurity: No Food Insecurity (04/07/2023)   Hunger Vital Sign    Worried About Running Out of Food in the Last Year: Never true    Ran Out of Food in the Last Year: Never true  Transportation Needs: No Transportation Needs (04/07/2023)   PRAPARE - Administrator, Civil Service (Medical): No    Lack of Transportation (Non-Medical): No  Physical Activity: Insufficiently Active (04/07/2023)   Exercise Vital Sign    Days of Exercise per Week: 3 days    Minutes of Exercise per Session: 30 min  Stress: No Stress Concern Present (04/07/2023)   [employer] of Occupational Health - Occupational Stress Questionnaire     Feeling of Stress : Not at all  Social Connections: Socially Isolated (04/07/2023)   Social Connection and Isolation Panel    Frequency of Communication with Friends and Family: More than three times a week    Frequency of Social Gatherings with Friends and Family: More than three times a week    Attends Religious Services: Never    Database administrator or Organizations: No    Attends Banker Meetings: Never    Marital Status: Never married  Intimate Partner Violence: Not At Risk (04/07/2023)   Humiliation, Afraid, Rape, and Kick questionnaire    Fear of Current or Ex-Partner: No    Emotionally Abused: No    Physically Abused: No    Sexually Abused: No    Outpatient Medications Prior to Visit  Medication Sig Dispense Refill   amLODipine  (NORVASC ) 10 MG tablet Take 1 tablet (10 mg total) by mouth daily. 90 tablet 3   atorvastatin  (LIPITOR) 80 MG tablet Take 1 tablet (80 mg total) by mouth daily. 90 tablet 3   Ferrous Sulfate (IRON PO) Take 36 mg by mouth daily.     glipiZIDE  (GLUCOTROL ) 5 MG tablet Take 1 tablet (5 mg total) by mouth 2 (two) times daily before a meal. 180 tablet 3   hydrochlorothiazide  (HYDRODIURIL ) 25 MG tablet Take 0.5 tablets (12.5 mg total) by mouth daily. 90 tablet 3   losartan  (COZAAR ) 100 MG tablet TAKE ONE TABLET DAILY 90 tablet 0   metFORMIN  (GLUCOPHAGE ) 1000 MG tablet Take 1 tablet (1,000 mg total) by mouth 2 (two) times daily with a meal. 180 tablet 3   tamsulosin  (FLOMAX ) 0.4 MG CAPS capsule TAKE ONE CAPSULE ONCE DAILY 90 capsule 3   No facility-administered medications prior to visit.    No Known Allergies  ROS Review of Systems Negative unless specially indicated above in HPI.   Objective:    Physical Exam Vitals and nursing note reviewed.  Constitutional:      General: He is not in acute distress.    Appearance: He is obese. He is not ill-appearing, toxic-appearing or diaphoretic.  HENT:     Head: Normocephalic and atraumatic.    Eyes:     General: No scleral icterus.    Extraocular  Movements: Extraocular movements intact.     Pupils: Pupils are equal, round, and reactive to light.   Neck:     Vascular: No carotid bruit.   Cardiovascular:     Rate and Rhythm: Normal rate and regular rhythm.     Heart sounds: Murmur heard.     Systolic murmur is present with a grade of 3/6.  Pulmonary:     Effort: Pulmonary effort is normal. No respiratory distress.     Breath sounds: Normal breath sounds.  Abdominal:     General: Bowel sounds are normal.     Palpations: Abdomen is soft.     Tenderness: There is no abdominal tenderness. There is no guarding or rebound.   Musculoskeletal:     Cervical back: Neck supple. No tenderness.     Right lower leg: No edema.     Left lower leg: No edema.   Skin:    General: Skin is warm and dry.   Neurological:     General: No focal deficit present.     Mental Status: He is alert and oriented to person, place, and time.   Psychiatric:        Mood and Affect: Mood normal.        Behavior: Behavior normal.     BP 137/78   Pulse 77   Temp 97.9 F (36.6 C) (Temporal)   Ht 6' 1 (1.854 m)   Wt 267 lb 3.2 oz (121.2 kg)   SpO2 95%   BMI 35.25 kg/m  BP Readings from Last 3 Encounters:  04/22/24 137/78  01/21/24 (!) 141/64  10/16/23 129/69   Wt Readings from Last 3 Encounters:  04/22/24 267 lb 3.2 oz (121.2 kg)  01/21/24 269 lb 9.6 oz (122.3 kg)  10/16/23 263 lb 4 oz (119.4 kg)     Health Maintenance Due  Topic Date Due   OPHTHALMOLOGY EXAM  01/19/2019   Medicare Annual Wellness (AWV)  04/06/2024    There are no preventive care reminders to display for this patient.  Lab Results  Component Value Date   TSH 1.870 11/23/2020   Lab Results  Component Value Date   WBC 4.0 09/23/2023   HGB 12.1 (L) 09/23/2023   HCT 37.2 (L) 09/23/2023   MCV 94 09/23/2023   PLT 233 09/23/2023   Lab Results  Component Value Date   NA 141 09/23/2023   K 5.1 09/23/2023    CO2 23 09/23/2023   GLUCOSE 176 (H) 09/23/2023   BUN 15 09/23/2023   CREATININE 1.01 09/23/2023   BILITOT 0.3 09/23/2023   ALKPHOS 80 09/23/2023   AST 17 09/23/2023   ALT 18 09/23/2023   PROT 6.7 09/23/2023   ALBUMIN 4.3 09/23/2023   CALCIUM  9.5 09/23/2023   ANIONGAP 8 02/14/2020   EGFR 81 09/23/2023   Lab Results  Component Value Date   CHOL 111 09/23/2023   Lab Results  Component Value Date   HDL 37 (L) 09/23/2023   Lab Results  Component Value Date   LDLCALC 54 09/23/2023   Lab Results  Component Value Date   TRIG 104 09/23/2023   Lab Results  Component Value Date   CHOLHDL 3.0 09/23/2023   Lab Results  Component Value Date   HGBA1C 7.2 (H) 01/21/2024      Assessment & Plan:   Type 2 diabetes mellitus with hyperglycemia, without long-term current use of insulin (HCC) A1c 6.4 today, at goal of <7. Medication changes today: Continue metformin  and glipizide . He is  one an ACE/ARB and statin.Diet and exercise.  -     Bayer DCA Hb A1c Waived  Long term current use of oral hypoglycemic drug  Hyperlipidemia associated with type 2 diabetes mellitus (HCC) On statin. LDL at 54.  Hypertension associated with diabetes (HCC) Well controlled on current regimen.  -     Losartan  Potassium; Take 1 tablet (100 mg total) by mouth daily.  Dispense: 90 tablet; Refill: 3  Morbid obesity (HCC) Trending down. Diet, exercise, weight loss.   Chronic kidney disease, stage 3a (HCC) -     CMP14+EGFR  Vitamin D  deficiency -     VITAMIN D  25 Hydroxy (Vit-D Deficiency, Fractures)  Heart murmur Nonrheumatic aortic valve stenosis Denies symptoms. He has been reminded numerous times to follow up with cardiology.   Follow-up: Return for schedule DM eye exam anytime, 6 month f/u with me.   The patient indicates understanding of these issues and agrees with the plan.    Albertha Huger, FNP

## 2024-04-23 LAB — CMP14+EGFR
ALT: 16 IU/L (ref 0–44)
AST: 19 IU/L (ref 0–40)
Albumin: 4.4 g/dL (ref 3.9–4.9)
Alkaline Phosphatase: 67 IU/L (ref 44–121)
BUN/Creatinine Ratio: 23 (ref 10–24)
BUN: 27 mg/dL (ref 8–27)
Bilirubin Total: 0.3 mg/dL (ref 0.0–1.2)
CO2: 20 mmol/L (ref 20–29)
Calcium: 10 mg/dL (ref 8.6–10.2)
Chloride: 107 mmol/L — ABNORMAL HIGH (ref 96–106)
Creatinine, Ser: 1.16 mg/dL (ref 0.76–1.27)
Globulin, Total: 2.4 g/dL (ref 1.5–4.5)
Glucose: 162 mg/dL — ABNORMAL HIGH (ref 70–99)
Potassium: 3.7 mmol/L (ref 3.5–5.2)
Sodium: 145 mmol/L — ABNORMAL HIGH (ref 134–144)
Total Protein: 6.8 g/dL (ref 6.0–8.5)
eGFR: 69 mL/min/{1.73_m2} (ref >59)

## 2024-04-23 LAB — VITAMIN D 25 HYDROXY (VIT D DEFICIENCY, FRACTURES): Vit D, 25-Hydroxy: 29.2 ng/mL — ABNORMAL LOW (ref 30.0–100.0)

## 2024-04-25 ENCOUNTER — Ambulatory Visit: Payer: Self-pay | Admitting: Family Medicine

## 2024-04-25 DIAGNOSIS — E559 Vitamin D deficiency, unspecified: Secondary | ICD-10-CM

## 2024-04-25 MED ORDER — VITAMIN D (ERGOCALCIFEROL) 1.25 MG (50000 UNIT) PO CAPS: 50000.0000 [IU] | ORAL_CAPSULE | ORAL | 0 refills | Status: AC

## 2024-05-25 ENCOUNTER — Ambulatory Visit (INDEPENDENT_AMBULATORY_CARE_PROVIDER_SITE_OTHER)

## 2024-05-25 DIAGNOSIS — E1165 Type 2 diabetes mellitus with hyperglycemia: Secondary | ICD-10-CM

## 2024-05-25 LAB — HM DIABETES EYE EXAM

## 2024-05-25 NOTE — Progress Notes (Unsigned)
 Arrived on 05/25/2024 and has given verbal consent to obtain images and complete their overdue diabetic retinal screening.  The images have been sent to an ophthalmologist or optometrist for review and interpretation.  Results will be sent back to [facility] for review.  Patient has been informed they will be contacted when we receive the results via telephone or MyChart.  Was unsuccessful in getting a good image of patients LT eye due to pupil being too small. Patient denies having any problems with either eyes.

## 2024-05-31 ENCOUNTER — Other Ambulatory Visit: Payer: Self-pay | Admitting: Family Medicine

## 2024-05-31 DIAGNOSIS — E1165 Type 2 diabetes mellitus with hyperglycemia: Secondary | ICD-10-CM

## 2024-07-22 ENCOUNTER — Other Ambulatory Visit: Payer: Self-pay | Admitting: Family Medicine

## 2024-07-26 ENCOUNTER — Other Ambulatory Visit: Payer: Self-pay | Admitting: Family Medicine

## 2024-07-26 DIAGNOSIS — E1169 Type 2 diabetes mellitus with other specified complication: Secondary | ICD-10-CM

## 2024-08-31 ENCOUNTER — Other Ambulatory Visit: Payer: Self-pay | Admitting: Family Medicine

## 2024-08-31 DIAGNOSIS — I152 Hypertension secondary to endocrine disorders: Secondary | ICD-10-CM

## 2024-10-24 ENCOUNTER — Encounter: Payer: Self-pay | Admitting: Family Medicine

## 2024-10-24 ENCOUNTER — Ambulatory Visit (INDEPENDENT_AMBULATORY_CARE_PROVIDER_SITE_OTHER): Payer: Self-pay | Admitting: Family Medicine

## 2024-10-24 VITALS — BP 127/66 | HR 87 | Ht 73.0 in | Wt 266.0 lb

## 2024-10-24 DIAGNOSIS — N138 Other obstructive and reflux uropathy: Secondary | ICD-10-CM

## 2024-10-24 DIAGNOSIS — N1831 Chronic kidney disease, stage 3a: Secondary | ICD-10-CM | POA: Diagnosis not present

## 2024-10-24 DIAGNOSIS — E1169 Type 2 diabetes mellitus with other specified complication: Secondary | ICD-10-CM | POA: Diagnosis not present

## 2024-10-24 DIAGNOSIS — I350 Nonrheumatic aortic (valve) stenosis: Secondary | ICD-10-CM

## 2024-10-24 DIAGNOSIS — E1165 Type 2 diabetes mellitus with hyperglycemia: Secondary | ICD-10-CM

## 2024-10-24 DIAGNOSIS — E785 Hyperlipidemia, unspecified: Secondary | ICD-10-CM | POA: Diagnosis not present

## 2024-10-24 DIAGNOSIS — N401 Enlarged prostate with lower urinary tract symptoms: Secondary | ICD-10-CM

## 2024-10-24 DIAGNOSIS — E1159 Type 2 diabetes mellitus with other circulatory complications: Secondary | ICD-10-CM

## 2024-10-24 DIAGNOSIS — Z0000 Encounter for general adult medical examination without abnormal findings: Secondary | ICD-10-CM

## 2024-10-24 DIAGNOSIS — Z6835 Body mass index (BMI) 35.0-35.9, adult: Secondary | ICD-10-CM

## 2024-10-24 DIAGNOSIS — Z23 Encounter for immunization: Secondary | ICD-10-CM | POA: Diagnosis not present

## 2024-10-24 DIAGNOSIS — R011 Cardiac murmur, unspecified: Secondary | ICD-10-CM | POA: Diagnosis not present

## 2024-10-24 DIAGNOSIS — Z0001 Encounter for general adult medical examination with abnormal findings: Secondary | ICD-10-CM | POA: Diagnosis not present

## 2024-10-24 DIAGNOSIS — I152 Hypertension secondary to endocrine disorders: Secondary | ICD-10-CM | POA: Diagnosis not present

## 2024-10-24 LAB — CMP14+EGFR
ALT: 17 IU/L (ref 0–44)
AST: 17 IU/L (ref 0–40)
Albumin: 4.5 g/dL (ref 3.9–4.9)
Alkaline Phosphatase: 79 IU/L (ref 47–123)
BUN/Creatinine Ratio: 14 (ref 10–24)
BUN: 13 mg/dL (ref 8–27)
Bilirubin Total: 0.4 mg/dL (ref 0.0–1.2)
CO2: 24 mmol/L (ref 20–29)
Calcium: 9.8 mg/dL (ref 8.6–10.2)
Chloride: 104 mmol/L (ref 96–106)
Creatinine, Ser: 0.9 mg/dL (ref 0.76–1.27)
Globulin, Total: 2.5 g/dL (ref 1.5–4.5)
Glucose: 143 mg/dL — ABNORMAL HIGH (ref 70–99)
Potassium: 3.5 mmol/L (ref 3.5–5.2)
Sodium: 143 mmol/L (ref 134–144)
Total Protein: 7 g/dL (ref 6.0–8.5)
eGFR: 92 mL/min/1.73

## 2024-10-24 LAB — CBC WITH DIFFERENTIAL/PLATELET
Basophils Absolute: 0.1 x10E3/uL (ref 0.0–0.2)
Basos: 1 %
EOS (ABSOLUTE): 0.1 x10E3/uL (ref 0.0–0.4)
Eos: 3 %
Hematocrit: 38.3 % (ref 37.5–51.0)
Hemoglobin: 12.7 g/dL — ABNORMAL LOW (ref 13.0–17.7)
Immature Grans (Abs): 0 x10E3/uL (ref 0.0–0.1)
Immature Granulocytes: 0 %
Lymphocytes Absolute: 1.6 x10E3/uL (ref 0.7–3.1)
Lymphs: 35 %
MCH: 31.6 pg (ref 26.6–33.0)
MCHC: 33.2 g/dL (ref 31.5–35.7)
MCV: 95 fL (ref 79–97)
Monocytes Absolute: 0.3 x10E3/uL (ref 0.1–0.9)
Monocytes: 7 %
Neutrophils Absolute: 2.5 x10E3/uL (ref 1.4–7.0)
Neutrophils: 53 %
Platelets: 272 x10E3/uL (ref 150–450)
RBC: 4.02 x10E6/uL — ABNORMAL LOW (ref 4.14–5.80)
RDW: 13.7 % (ref 11.6–15.4)
WBC: 4.6 x10E3/uL (ref 3.4–10.8)

## 2024-10-24 LAB — VITAMIN B12: Vitamin B-12: 560 pg/mL (ref 232–1245)

## 2024-10-24 LAB — LIPID PANEL
Chol/HDL Ratio: 3.2 ratio (ref 0.0–5.0)
Cholesterol, Total: 120 mg/dL (ref 100–199)
HDL: 37 mg/dL — ABNORMAL LOW
LDL Chol Calc (NIH): 59 mg/dL (ref 0–99)
Triglycerides: 137 mg/dL (ref 0–149)
VLDL Cholesterol Cal: 24 mg/dL (ref 5–40)

## 2024-10-24 LAB — TSH: TSH: 2.59 u[IU]/mL (ref 0.450–4.500)

## 2024-10-24 LAB — BAYER DCA HB A1C WAIVED: HB A1C (BAYER DCA - WAIVED): 6 % — ABNORMAL HIGH (ref 4.8–5.6)

## 2024-10-24 MED ORDER — GLIPIZIDE 5 MG PO TABS: ORAL_TABLET | ORAL | 1 refills | Status: AC

## 2024-10-24 MED ORDER — ATORVASTATIN CALCIUM 80 MG PO TABS: 80.0000 mg | ORAL_TABLET | Freq: Every day | ORAL | 1 refills | Status: AC

## 2024-10-24 MED ORDER — TAMSULOSIN HCL 0.4 MG PO CAPS: 0.4000 mg | ORAL_CAPSULE | Freq: Every day | ORAL | 1 refills | Status: AC

## 2024-10-24 MED ORDER — AMLODIPINE BESYLATE 10 MG PO TABS: 10.0000 mg | ORAL_TABLET | Freq: Every day | ORAL | 0 refills | Status: AC

## 2024-10-24 NOTE — Patient Instructions (Signed)
 Health Maintenance, Male  Adopting a healthy lifestyle and getting preventive care are important in promoting health and wellness. Ask your health care provider about:  The right schedule for you to have regular tests and exams.  Things you can do on your own to prevent diseases and keep yourself healthy.  What should I know about diet, weight, and exercise?  Eat a healthy diet    Eat a diet that includes plenty of vegetables, fruits, low-fat dairy products, and lean protein.  Do not eat a lot of foods that are high in solid fats, added sugars, or sodium.  Maintain a healthy weight  Body mass index (BMI) is a measurement that can be used to identify possible weight problems. It estimates body fat based on height and weight. Your health care provider can help determine your BMI and help you achieve or maintain a healthy weight.  Get regular exercise  Get regular exercise. This is one of the most important things you can do for your health. Most adults should:  Exercise for at least 150 minutes each week. The exercise should increase your heart rate and make you sweat (moderate-intensity exercise).  Do strengthening exercises at least twice a week. This is in addition to the moderate-intensity exercise.  Spend less time sitting. Even light physical activity can be beneficial.  Watch cholesterol and blood lipids  Have your blood tested for lipids and cholesterol at 69 years of age, then have this test every 5 years.  You may need to have your cholesterol levels checked more often if:  Your lipid or cholesterol levels are high.  You are older than 69 years of age.  You are at high risk for heart disease.  What should I know about cancer screening?  Many types of cancers can be detected early and may often be prevented. Depending on your health history and family history, you may need to have cancer screening at various ages. This may include screening for:  Colorectal cancer.  Prostate cancer.  Skin cancer.  Lung  cancer.  What should I know about heart disease, diabetes, and high blood pressure?  Blood pressure and heart disease  High blood pressure causes heart disease and increases the risk of stroke. This is more likely to develop in people who have high blood pressure readings or are overweight.  Talk with your health care provider about your target blood pressure readings.  Have your blood pressure checked:  Every 3-5 years if you are 24-52 years of age.  Every year if you are 3 years old or older.  If you are between the ages of 60 and 72 and are a current or former smoker, ask your health care provider if you should have a one-time screening for abdominal aortic aneurysm (AAA).  Diabetes  Have regular diabetes screenings. This checks your fasting blood sugar level. Have the screening done:  Once every three years after age 66 if you are at a normal weight and have a low risk for diabetes.  More often and at a younger age if you are overweight or have a high risk for diabetes.  What should I know about preventing infection?  Hepatitis B  If you have a higher risk for hepatitis B, you should be screened for this virus. Talk with your health care provider to find out if you are at risk for hepatitis B infection.  Hepatitis C  Blood testing is recommended for:  Everyone born from [birth year] through 1966.  Anyone  with known risk factors for hepatitis C.  Sexually transmitted infections (STIs)  You should be screened each year for STIs, including gonorrhea and chlamydia, if:  You are sexually active and are younger than 69 years of age.  You are older than 69 years of age and your health care provider tells you that you are at risk for this type of infection.  Your sexual activity has changed since you were last screened, and you are at increased risk for chlamydia or gonorrhea. Ask your health care provider if you are at risk.  Ask your health care provider about whether you are at high risk for HIV. Your health care provider  may recommend a prescription medicine to help prevent HIV infection. If you choose to take medicine to prevent HIV, you should first get tested for HIV. You should then be tested every 3 months for as long as you are taking the medicine.  Follow these instructions at home:  Alcohol use  Do not drink alcohol if your health care provider tells you not to drink.  If you drink alcohol:  Limit how much you have to 0-2 drinks a day.  Know how much alcohol is in your drink. In the [place] one drink equals one 12 oz bottle of beer (355 mL), one 5 oz glass of wine (148 mL), or one 1 oz glass of hard liquor (44 mL).  Lifestyle  Do not use any products that contain nicotine or tobacco. These products include cigarettes, chewing tobacco, and vaping devices, such as e-cigarettes. If you need help quitting, ask your health care provider.  Do not use street drugs.  Do not share needles.  Ask your health care provider for help if you need support or information about quitting drugs.  General instructions  Schedule regular health, dental, and eye exams.  Stay current with your vaccines.  Tell your health care provider if:  You often feel depressed.  You have ever been abused or do not feel safe at home.  Summary  Adopting a healthy lifestyle and getting preventive care are important in promoting health and wellness.  Follow your health care provider's instructions about healthy diet, exercising, and getting tested or screened for diseases.  Follow your health care provider's instructions on monitoring your cholesterol and blood pressure.  This information is not intended to replace advice given to you by your health care provider. Make sure you discuss any questions you have with your health care provider.  Document Revised: 03/11/2021 Document Reviewed: 03/11/2021  Elsevier Patient Education  2024 [employer].

## 2024-10-24 NOTE — Progress Notes (Signed)
 "  Complete physical exam  Patient: Jack Cherry   DOB: 23-Dec-1955   68 y.o. Male  MRN: [id number]  Subjective:    Chief Complaint  Patient presents with   Annual Exam    Harish Bram is a 68 y.o. male who presents today for a complete physical exam. He reports consuming a general diet. He walks daily. He generally feels well. He reports sleeping poorly. He does not have additional problems to discuss today.   Reports compliance with medications.   Has not had cardiology follow up for heart murmur. Denies symptoms.   Not checking blood sugars.      Most recent fall risk assessment:    10/24/2024    8:36 AM  Fall Risk   Falls in the past year? 0  Number falls in past yr: 0  Injury with Fall? 0  Risk for fall due to : No Fall Risks  Follow up Falls evaluation completed     Most recent depression screenings:    10/24/2024    8:36 AM 04/22/2024    8:09 AM  PHQ 2/9 Scores  PHQ - 2 Score 0 0  PHQ- 9 Score  0      Data saved with a previous flowsheet row definition        Patient Care Team: Joesph Annabella HERO, FNP as PCP - General (Family Medicine) Harvey Margo CROME, MD (Inactive) as Consulting Physician (Gastroenterology) Cindie Carlin POUR, DO as Consulting Physician (Internal Medicine)   Show/hide medication list[1]  ROS Negative unless specially indicated above in HPI.     Objective:     BP 127/66   Pulse 87   Ht 6' 1 (1.854 m)   Wt 266 lb (120.7 kg)   SpO2 95%   BMI 35.09 kg/m    Physical Exam Vitals and nursing note reviewed.  Constitutional:      General: He is not in acute distress.    Appearance: He is obese. He is not ill-appearing, toxic-appearing or diaphoretic.  HENT:     Head: Normocephalic.     Right Ear: Tympanic membrane, ear canal and external ear normal.     Left Ear: Tympanic membrane, ear canal and external ear normal.     Nose: Nose normal.     Mouth/Throat:     Mouth: Mucous membranes are moist.     Pharynx: Oropharynx  is clear.  Eyes:     Extraocular Movements: Extraocular movements intact.     Conjunctiva/sclera: Conjunctivae normal.     Pupils: Pupils are equal, round, and reactive to light.  Cardiovascular:     Rate and Rhythm: Normal rate and regular rhythm.     Pulses: Normal pulses.     Heart sounds: Murmur heard.     Systolic murmur is present with a grade of 3/6.     No friction rub. No gallop.  Pulmonary:     Effort: Pulmonary effort is normal.     Breath sounds: Normal breath sounds.  Abdominal:     General: Bowel sounds are normal. There is no distension.     Palpations: Abdomen is soft. There is no mass.     Tenderness: There is no abdominal tenderness. There is no guarding.  Musculoskeletal:     Cervical back: Normal range of motion and neck supple. No tenderness.     Right lower leg: No edema.     Left lower leg: No edema.  Skin:    General: Skin is warm and dry.  Capillary Refill: Capillary refill takes less than 2 seconds.     Findings: No lesion or rash.  Neurological:     Mental Status: He is alert and oriented to person, place, and time. Mental status is at baseline.  Psychiatric:        Mood and Affect: Mood normal.        Behavior: Behavior normal.        Thought Content: Thought content normal.      No results found for any visits on 10/24/24.     Assessment & Plan:    Routine Health Maintenance and Physical Exam  Neal was seen today for annual exam.  Diagnoses and all orders for this visit:  Routine general medical examination at a health care facility  Type 2 diabetes mellitus with hyperglycemia, without long-term current use of insulin (HCC) A1c 6.0, at goal of <7. Continue current medications.  -     Bayer DCA Hb A1c Waived -     Vitamin B12 -     TSH -     glipiZIDE  (GLUCOTROL ) 5 MG tablet; TAKE ONE TABLET TWICE DAILY WITH MEAL(S) -     Microalbumin / creatinine urine ratio  Hypertension associated with diabetes (HCC) Well controlled on  current regimen.  -     CBC with Differential/Platelet -     CMP14+EGFR -     amLODipine  (NORVASC ) 10 MG tablet; Take 1 tablet (10 mg total) by mouth daily.  Hyperlipidemia associated with type 2 diabetes mellitus (HCC) On statin.  -     Lipid panel -     atorvastatin  (LIPITOR) 80 MG tablet; Take 1 tablet (80 mg total) by mouth daily.  Heart murmur Nonrheumatic aortic valve stenosis Reminded to schedule cardiology follow up.   Morbid obesity (HCC) BMI 35.0-35.9,adult BMI 35 with DM. Diet, exercise, weight loss.   Encounter for immunization -     Flu vaccine HIGH DOSE PF(Fluzone Trivalent)  Chronic kidney disease, stage 3a (HCC) On ARB.  Avoid NSAIDs. Labs pending.   BPH -     tamsulosin  (FLOMAX ) 0.4 MG CAPS capsule; Take 1 capsule (0.4 mg total) by mouth daily.    Immunization History  Administered Date(s) Administered   Fluad Quad(high Dose 65+) 11/23/2020, 09/13/2021   Fluad Trivalent(High Dose 65+) 09/23/2023   INFLUENZA, HIGH DOSE SEASONAL PF 10/24/2024   Influenza Split 08/07/2015, 08/06/2016   Influenza,inj,Quad PF,6+ Mos 08/06/2017, 09/04/2018   Influenza-Unspecified 08/16/2019   Moderna Sars-Covid-2 Vaccination 02/14/2020, 03/13/2020   PNEUMOCOCCAL CONJUGATE-20 03/12/2022   Pneumococcal Conjugate-13 11/23/2020    Health Maintenance  Topic Date Due   OPHTHALMOLOGY EXAM  01/19/2019   Medicare Annual Wellness (AWV)  04/06/2024   Diabetic kidney evaluation - Urine ACR  06/21/2024   COVID-19 Vaccine (3 - 2025-26 season) 11/09/2024 (Originally 07/04/2024)   Zoster Vaccines- Shingrix (1 of 2) 01/22/2025 (Originally 09/02/2005)   DTaP/Tdap/Td (1 - Tdap) 10/24/2025 (Originally 09/02/1974)   FOOT EXAM  01/20/2025   Diabetic kidney evaluation - eGFR measurement  04/22/2025   HEMOGLOBIN A1C  04/24/2025   Colonoscopy  01/09/2028   Pneumococcal Vaccine: 50+ Years  Completed   Influenza Vaccine  Completed   Hepatitis C Screening  Completed   Meningococcal B Vaccine   Aged Out   COLON CANCER SCREENING ANNUAL FOBT  Discontinued    Discussed health benefits of physical activity, and encouraged him to engage in regular exercise appropriate for his age and condition.  Problem List Items Addressed This Visit  Cardiovascular and Mediastinum   Hypertension associated with diabetes (HCC) (Chronic)   Relevant Medications   amLODipine  (NORVASC ) 10 MG tablet   atorvastatin  (LIPITOR) 80 MG tablet   glipiZIDE  (GLUCOTROL ) 5 MG tablet   Other Relevant Orders   CBC with Differential/Platelet   CMP14+EGFR   TSH   Nonrheumatic aortic valve stenosis   Relevant Medications   amLODipine  (NORVASC ) 10 MG tablet   atorvastatin  (LIPITOR) 80 MG tablet     Endocrine   Type 2 diabetes mellitus with hyperglycemia, without long-term current use of insulin (HCC) (Chronic)   Relevant Medications   atorvastatin  (LIPITOR) 80 MG tablet   glipiZIDE  (GLUCOTROL ) 5 MG tablet   Other Relevant Orders   Bayer DCA Hb A1c Waived   Vitamin B12   Microalbumin / creatinine urine ratio   Hyperlipidemia associated with type 2 diabetes mellitus (HCC) (Chronic)   Relevant Medications   amLODipine  (NORVASC ) 10 MG tablet   atorvastatin  (LIPITOR) 80 MG tablet   glipiZIDE  (GLUCOTROL ) 5 MG tablet   Other Relevant Orders   Lipid panel     Genitourinary   Chronic kidney disease, stage 3a (HCC) (Chronic)   BPH with obstruction/lower urinary tract symptoms   Relevant Medications   tamsulosin  (FLOMAX ) 0.4 MG CAPS capsule     Other   Morbid obesity (HCC) (Chronic)   Relevant Medications   glipiZIDE  (GLUCOTROL ) 5 MG tablet   Heart murmur   Other Visit Diagnoses       Routine general medical examination at a health care facility    -  Primary     BMI 35.0-35.9,adult         Encounter for immunization       Relevant Orders   Flu vaccine HIGH DOSE PF(Fluzone Trivalent) (Completed)      Return in 1 year (on 10/24/2025).   The patient indicates understanding of these issues and  agrees with the plan.  Annabella CHRISTELLA Search, FNP      [1]  Outpatient Medications Prior to Visit  Medication Sig   amLODipine  (NORVASC ) 10 MG tablet TAKE ONE TABLET DAILY   atorvastatin  (LIPITOR) 80 MG tablet TAKE ONE TABLET DAILY   glipiZIDE  (GLUCOTROL ) 5 MG tablet TAKE ONE TABLET TWICE DAILY WITH MEAL(S)   hydrochlorothiazide  (HYDRODIURIL ) 25 MG tablet Take 0.5 tablets (12.5 mg total) by mouth daily.   losartan  (COZAAR ) 100 MG tablet Take 1 tablet (100 mg total) by mouth daily.   metFORMIN  (GLUCOPHAGE ) 1000 MG tablet Take 1 tablet (1,000 mg total) by mouth 2 (two) times daily with a meal.   tamsulosin  (FLOMAX ) 0.4 MG CAPS capsule TAKE ONE CAPSULE ONCE DAILY   [DISCONTINUED] Ferrous Sulfate (IRON PO) Take 36 mg by mouth daily.   [DISCONTINUED] Vitamin D , Ergocalciferol , (DRISDOL ) 1.25 MG (50000 UNIT) CAPS capsule Take 1 capsule (50,000 Units total) by mouth every 7 (seven) days.   No facility-administered medications prior to visit.   "

## 2024-10-25 LAB — MICROALBUMIN / CREATININE URINE RATIO
Creatinine, Urine: 130.5 mg/dL
Microalb/Creat Ratio: 55 mg/g{creat} — ABNORMAL HIGH (ref 0–29)
Microalbumin, Urine: 71.2 ug/mL

## 2024-10-31 ENCOUNTER — Ambulatory Visit: Payer: Self-pay

## 2024-10-31 ENCOUNTER — Ambulatory Visit: Payer: Self-pay | Admitting: Family Medicine

## 2024-10-31 NOTE — Telephone Encounter (Signed)
 FYI Only or Action Required?: Action required by provider: clinical question for provider.  Patient was last seen in primary care on 10/24/2024 by Joesph Annabella HERO, FNP.  Called Nurse Triage reporting Lab Results.  Symptoms began n/a.  Interventions attempted: Other: n/a.  Symptoms are: n/a.  Triage Disposition: Call PCP When Office is Open  Patient/caregiver understands and will follow disposition?: Yes Reason for Disposition  [1] Caller requesting NON-URGENT health information AND [2] PCP's office is the best resource  Answer Assessment - Initial Assessment Questions Relayed providers notes on lab results. Patient verbalizes understanding. States PCP has tried to get him to take Jardiance before but he's not taking that mess due to the symptoms described on TV and the fact that the prescription is $1700 at the pharmacy. He is open to something cheaper if possible. Please advise.  1. REASON FOR CALL: What is the main reason for your call? or How can I best help you?     Patient calling for lab results  2. SYMPTOMS : Do you have any symptoms?      Denies  Protocols used: Information Only Call - No Triage-A-AH  Copied from CRM [id number]. Topic: Clinical - Lab/Test Results >> Oct 31, 2024  1:24 PM Carrielelia G wrote: Patient stated he would like to be connected to the nurse regarding his lab results.

## 2024-11-17 NOTE — Telephone Encounter (Signed)
 Left message to call back.

## 2025-04-24 ENCOUNTER — Ambulatory Visit: Admitting: Family Medicine
